# Patient Record
Sex: Female | Born: 1945 | Race: White | Hispanic: No | Smoking: Never smoker
Health system: Southern US, Community
[De-identification: ages and names within clinical notes are randomized; demographics above are authoritative.]

## PROBLEM LIST (undated history)

## (undated) DIAGNOSIS — M069 Rheumatoid arthritis, unspecified: Secondary | ICD-10-CM

## (undated) DIAGNOSIS — I7300 Raynaud's syndrome without gangrene: Secondary | ICD-10-CM

## (undated) DIAGNOSIS — M3501 Sicca syndrome with keratoconjunctivitis: Secondary | ICD-10-CM

## (undated) DIAGNOSIS — M8580 Other specified disorders of bone density and structure, unspecified site: Secondary | ICD-10-CM

## (undated) DIAGNOSIS — E785 Hyperlipidemia, unspecified: Secondary | ICD-10-CM

## (undated) DIAGNOSIS — T7840XA Allergy, unspecified, initial encounter: Secondary | ICD-10-CM

## (undated) DIAGNOSIS — J309 Allergic rhinitis, unspecified: Secondary | ICD-10-CM

## (undated) DIAGNOSIS — M341 CR(E)ST syndrome: Secondary | ICD-10-CM

## (undated) DIAGNOSIS — E559 Vitamin D deficiency, unspecified: Secondary | ICD-10-CM

## (undated) DIAGNOSIS — I82409 Acute embolism and thrombosis of unspecified deep veins of unspecified lower extremity: Secondary | ICD-10-CM

## (undated) DIAGNOSIS — R011 Cardiac murmur, unspecified: Secondary | ICD-10-CM

## (undated) DIAGNOSIS — E7800 Pure hypercholesterolemia, unspecified: Secondary | ICD-10-CM

## (undated) DIAGNOSIS — R0600 Dyspnea, unspecified: Secondary | ICD-10-CM

## (undated) DIAGNOSIS — K219 Gastro-esophageal reflux disease without esophagitis: Secondary | ICD-10-CM

## (undated) DIAGNOSIS — M349 Systemic sclerosis, unspecified: Secondary | ICD-10-CM

## (undated) HISTORY — DX: Pure hypercholesterolemia, unspecified: E78.00

## (undated) HISTORY — DX: Other specified disorders of bone density and structure, unspecified site: M85.80

## (undated) HISTORY — DX: Allergy, unspecified, initial encounter: T78.40XA

## (undated) HISTORY — DX: Cardiac murmur, unspecified: R01.1

## (undated) HISTORY — DX: Systemic sclerosis, unspecified: M34.9

## (undated) HISTORY — DX: Raynaud's syndrome without gangrene: I73.00

## (undated) HISTORY — DX: Sjogren syndrome with keratoconjunctivitis: M35.01

## (undated) HISTORY — DX: Vitamin D deficiency, unspecified: E55.9

## (undated) HISTORY — DX: Gastro-esophageal reflux disease without esophagitis: K21.9

## (undated) HISTORY — DX: Hyperlipidemia, unspecified: E78.5

## (undated) HISTORY — DX: Allergic rhinitis, unspecified: J30.9

## (undated) HISTORY — DX: Rheumatoid arthritis, unspecified: M06.9

## (undated) HISTORY — DX: Acute embolism and thrombosis of unspecified deep veins of unspecified lower extremity: I82.409

## (undated) HISTORY — PX: DIAGNOSTIC LAPAROSCOPY: SUR761

## (undated) HISTORY — DX: Cr(e)st syndrome: M34.1

---

## 1998-04-12 ENCOUNTER — Other Ambulatory Visit: Admission: RE | Admit: 1998-04-12 | Discharge: 1998-04-12 | Payer: Self-pay | Admitting: Obstetrics and Gynecology

## 1999-04-06 ENCOUNTER — Other Ambulatory Visit: Admission: RE | Admit: 1999-04-06 | Discharge: 1999-04-06 | Payer: Self-pay | Admitting: Obstetrics and Gynecology

## 1999-05-14 ENCOUNTER — Encounter: Payer: Self-pay | Admitting: Obstetrics and Gynecology

## 1999-05-14 ENCOUNTER — Encounter: Admission: RE | Admit: 1999-05-14 | Discharge: 1999-05-14 | Payer: Self-pay | Admitting: Obstetrics and Gynecology

## 2000-04-21 ENCOUNTER — Other Ambulatory Visit: Admission: RE | Admit: 2000-04-21 | Discharge: 2000-04-21 | Payer: Self-pay | Admitting: Obstetrics and Gynecology

## 2000-04-21 ENCOUNTER — Encounter: Payer: Self-pay | Admitting: Obstetrics and Gynecology

## 2000-04-21 ENCOUNTER — Encounter: Admission: RE | Admit: 2000-04-21 | Discharge: 2000-04-21 | Payer: Self-pay | Admitting: Obstetrics and Gynecology

## 2000-07-15 ENCOUNTER — Observation Stay (HOSPITAL_COMMUNITY): Admission: EM | Admit: 2000-07-15 | Discharge: 2000-07-16 | Payer: Self-pay

## 2000-07-15 ENCOUNTER — Ambulatory Visit (HOSPITAL_COMMUNITY): Admission: RE | Admit: 2000-07-15 | Discharge: 2000-07-15 | Payer: Self-pay

## 2001-04-27 ENCOUNTER — Encounter: Payer: Self-pay | Admitting: Obstetrics and Gynecology

## 2001-04-27 ENCOUNTER — Encounter: Admission: RE | Admit: 2001-04-27 | Discharge: 2001-04-27 | Payer: Self-pay | Admitting: Obstetrics and Gynecology

## 2001-04-27 ENCOUNTER — Other Ambulatory Visit: Admission: RE | Admit: 2001-04-27 | Discharge: 2001-04-27 | Payer: Self-pay | Admitting: Obstetrics and Gynecology

## 2002-04-30 ENCOUNTER — Encounter: Payer: Self-pay | Admitting: Obstetrics and Gynecology

## 2002-04-30 ENCOUNTER — Encounter: Admission: RE | Admit: 2002-04-30 | Discharge: 2002-04-30 | Payer: Self-pay | Admitting: Obstetrics and Gynecology

## 2002-04-30 ENCOUNTER — Other Ambulatory Visit: Admission: RE | Admit: 2002-04-30 | Discharge: 2002-04-30 | Payer: Self-pay | Admitting: Obstetrics and Gynecology

## 2003-06-06 ENCOUNTER — Other Ambulatory Visit: Admission: RE | Admit: 2003-06-06 | Discharge: 2003-06-06 | Payer: Self-pay | Admitting: Obstetrics and Gynecology

## 2003-06-06 ENCOUNTER — Encounter: Admission: RE | Admit: 2003-06-06 | Discharge: 2003-06-06 | Payer: Self-pay | Admitting: Obstetrics and Gynecology

## 2004-06-11 ENCOUNTER — Other Ambulatory Visit: Admission: RE | Admit: 2004-06-11 | Discharge: 2004-06-11 | Payer: Self-pay | Admitting: Obstetrics and Gynecology

## 2004-06-11 ENCOUNTER — Encounter: Admission: RE | Admit: 2004-06-11 | Discharge: 2004-06-11 | Payer: Self-pay | Admitting: Obstetrics and Gynecology

## 2004-11-26 ENCOUNTER — Encounter: Admission: RE | Admit: 2004-11-26 | Discharge: 2004-11-26 | Payer: Self-pay | Admitting: Rheumatology

## 2005-06-14 ENCOUNTER — Other Ambulatory Visit: Admission: RE | Admit: 2005-06-14 | Discharge: 2005-06-14 | Payer: Self-pay | Admitting: Obstetrics and Gynecology

## 2005-06-14 ENCOUNTER — Encounter: Admission: RE | Admit: 2005-06-14 | Discharge: 2005-06-14 | Payer: Self-pay | Admitting: Obstetrics and Gynecology

## 2005-09-18 ENCOUNTER — Ambulatory Visit: Payer: Self-pay | Admitting: Cardiovascular Disease

## 2005-09-18 ENCOUNTER — Ambulatory Visit (HOSPITAL_COMMUNITY): Admission: RE | Admit: 2005-09-18 | Discharge: 2005-09-18 | Payer: Self-pay | Admitting: Rheumatology

## 2005-09-18 ENCOUNTER — Encounter: Payer: Self-pay | Admitting: Cardiovascular Disease

## 2006-06-09 ENCOUNTER — Encounter: Admission: RE | Admit: 2006-06-09 | Discharge: 2006-06-09 | Payer: Self-pay | Admitting: *Deleted

## 2006-06-16 ENCOUNTER — Encounter: Admission: RE | Admit: 2006-06-16 | Discharge: 2006-06-16 | Payer: Self-pay | Admitting: Obstetrics and Gynecology

## 2006-07-25 ENCOUNTER — Other Ambulatory Visit: Admission: RE | Admit: 2006-07-25 | Discharge: 2006-07-25 | Payer: Self-pay | Admitting: Obstetrics and Gynecology

## 2007-06-19 ENCOUNTER — Encounter: Admission: RE | Admit: 2007-06-19 | Discharge: 2007-06-19 | Payer: Self-pay | Admitting: Obstetrics and Gynecology

## 2007-07-27 ENCOUNTER — Other Ambulatory Visit: Admission: RE | Admit: 2007-07-27 | Discharge: 2007-07-27 | Payer: Self-pay | Admitting: Obstetrics & Gynecology

## 2007-08-10 ENCOUNTER — Encounter: Admission: RE | Admit: 2007-08-10 | Discharge: 2007-08-10 | Payer: Self-pay | Admitting: Obstetrics and Gynecology

## 2007-10-23 ENCOUNTER — Encounter: Admission: RE | Admit: 2007-10-23 | Discharge: 2007-10-23 | Payer: Self-pay | Admitting: Family Medicine

## 2008-06-20 ENCOUNTER — Encounter: Admission: RE | Admit: 2008-06-20 | Discharge: 2008-06-20 | Payer: Self-pay | Admitting: Obstetrics and Gynecology

## 2008-06-27 ENCOUNTER — Encounter: Admission: RE | Admit: 2008-06-27 | Discharge: 2008-06-27 | Payer: Self-pay | Admitting: Obstetrics and Gynecology

## 2008-07-03 ENCOUNTER — Ambulatory Visit: Payer: Self-pay | Admitting: Diagnostic Radiology

## 2008-07-03 ENCOUNTER — Emergency Department (HOSPITAL_BASED_OUTPATIENT_CLINIC_OR_DEPARTMENT_OTHER): Admission: EM | Admit: 2008-07-03 | Discharge: 2008-07-03 | Payer: Self-pay | Admitting: Emergency Medicine

## 2009-06-23 ENCOUNTER — Encounter: Admission: RE | Admit: 2009-06-23 | Discharge: 2009-06-23 | Payer: Self-pay | Admitting: Obstetrics and Gynecology

## 2010-01-08 ENCOUNTER — Ambulatory Visit (HOSPITAL_COMMUNITY): Admission: RE | Admit: 2010-01-08 | Discharge: 2010-01-08 | Payer: Self-pay | Admitting: Rheumatology

## 2010-01-31 ENCOUNTER — Ambulatory Visit (HOSPITAL_COMMUNITY)
Admission: RE | Admit: 2010-01-31 | Discharge: 2010-01-31 | Payer: Self-pay | Source: Home / Self Care | Attending: Rheumatology | Admitting: Rheumatology

## 2010-01-31 ENCOUNTER — Encounter (INDEPENDENT_AMBULATORY_CARE_PROVIDER_SITE_OTHER): Payer: Self-pay | Admitting: Rheumatology

## 2010-06-05 ENCOUNTER — Other Ambulatory Visit: Payer: Self-pay | Admitting: Obstetrics and Gynecology

## 2010-06-05 DIAGNOSIS — Z1231 Encounter for screening mammogram for malignant neoplasm of breast: Secondary | ICD-10-CM

## 2010-06-26 ENCOUNTER — Ambulatory Visit
Admission: RE | Admit: 2010-06-26 | Discharge: 2010-06-26 | Disposition: A | Discharge status: Home / Self Care | Payer: 59 | Source: Ambulatory Visit | Attending: Obstetrics and Gynecology | Admitting: Obstetrics and Gynecology

## 2010-06-26 DIAGNOSIS — Z1231 Encounter for screening mammogram for malignant neoplasm of breast: Secondary | ICD-10-CM

## 2011-05-27 ENCOUNTER — Other Ambulatory Visit: Payer: Self-pay | Admitting: Obstetrics and Gynecology

## 2011-05-27 DIAGNOSIS — Z1231 Encounter for screening mammogram for malignant neoplasm of breast: Secondary | ICD-10-CM

## 2011-06-28 ENCOUNTER — Ambulatory Visit
Admission: RE | Admit: 2011-06-28 | Discharge: 2011-06-28 | Disposition: A | Discharge status: Home / Self Care | Payer: 59 | Source: Ambulatory Visit | Attending: Obstetrics and Gynecology | Admitting: Obstetrics and Gynecology

## 2011-06-28 DIAGNOSIS — Z1231 Encounter for screening mammogram for malignant neoplasm of breast: Secondary | ICD-10-CM

## 2012-06-01 ENCOUNTER — Other Ambulatory Visit: Payer: Self-pay

## 2012-06-01 DIAGNOSIS — Z1231 Encounter for screening mammogram for malignant neoplasm of breast: Secondary | ICD-10-CM

## 2012-06-29 ENCOUNTER — Ambulatory Visit
Admission: RE | Admit: 2012-06-29 | Discharge: 2012-06-29 | Disposition: A | Discharge status: Home / Self Care | Payer: 59 | Source: Ambulatory Visit

## 2012-06-29 DIAGNOSIS — Z1231 Encounter for screening mammogram for malignant neoplasm of breast: Secondary | ICD-10-CM

## 2013-06-08 ENCOUNTER — Other Ambulatory Visit: Payer: Self-pay

## 2013-06-08 DIAGNOSIS — Z1231 Encounter for screening mammogram for malignant neoplasm of breast: Secondary | ICD-10-CM

## 2013-07-05 ENCOUNTER — Encounter (INDEPENDENT_AMBULATORY_CARE_PROVIDER_SITE_OTHER): Payer: Self-pay

## 2013-07-05 ENCOUNTER — Ambulatory Visit
Admission: RE | Admit: 2013-07-05 | Discharge: 2013-07-05 | Disposition: A | Discharge status: Home / Self Care | Payer: Medicare Other | Source: Ambulatory Visit

## 2013-07-05 DIAGNOSIS — Z1231 Encounter for screening mammogram for malignant neoplasm of breast: Secondary | ICD-10-CM

## 2014-06-01 ENCOUNTER — Other Ambulatory Visit: Payer: Self-pay

## 2014-06-01 DIAGNOSIS — Z1231 Encounter for screening mammogram for malignant neoplasm of breast: Secondary | ICD-10-CM

## 2014-07-08 ENCOUNTER — Ambulatory Visit
Admission: RE | Admit: 2014-07-08 | Discharge: 2014-07-08 | Disposition: A | Discharge status: Home / Self Care | Payer: Medicare Other | Source: Ambulatory Visit

## 2014-07-08 DIAGNOSIS — Z1231 Encounter for screening mammogram for malignant neoplasm of breast: Secondary | ICD-10-CM

## 2014-08-22 ENCOUNTER — Other Ambulatory Visit: Payer: Self-pay

## 2015-07-14 ENCOUNTER — Other Ambulatory Visit: Payer: Self-pay

## 2015-07-14 DIAGNOSIS — Z1231 Encounter for screening mammogram for malignant neoplasm of breast: Secondary | ICD-10-CM

## 2015-07-31 ENCOUNTER — Other Ambulatory Visit: Payer: Self-pay | Admitting: Family Medicine

## 2015-07-31 ENCOUNTER — Ambulatory Visit
Admission: RE | Admit: 2015-07-31 | Discharge: 2015-07-31 | Disposition: A | Discharge status: Home / Self Care | Payer: Medicare Other | Source: Ambulatory Visit

## 2015-07-31 DIAGNOSIS — Z1231 Encounter for screening mammogram for malignant neoplasm of breast: Secondary | ICD-10-CM

## 2016-06-18 ENCOUNTER — Other Ambulatory Visit: Payer: Self-pay | Admitting: Family Medicine

## 2016-06-18 DIAGNOSIS — Z1231 Encounter for screening mammogram for malignant neoplasm of breast: Secondary | ICD-10-CM

## 2016-08-06 ENCOUNTER — Ambulatory Visit
Admission: RE | Admit: 2016-08-06 | Discharge: 2016-08-06 | Disposition: A | Discharge status: Home / Self Care | Payer: Medicare Other | Source: Ambulatory Visit | Attending: Family Medicine | Admitting: Family Medicine

## 2016-08-06 DIAGNOSIS — Z1231 Encounter for screening mammogram for malignant neoplasm of breast: Secondary | ICD-10-CM

## 2016-10-30 ENCOUNTER — Telehealth (HOSPITAL_COMMUNITY): Payer: Self-pay | Admitting: *Deleted

## 2016-10-30 DIAGNOSIS — M349 Systemic sclerosis, unspecified: Secondary | ICD-10-CM

## 2016-10-30 NOTE — Telephone Encounter (Signed)
Received referral from Dr Dierdre Forth, pt needs echo, pft and appt w/Dr Bensimhon for scleroderma

## 2016-12-10 ENCOUNTER — Ambulatory Visit (HOSPITAL_COMMUNITY)
Admission: RE | Admit: 2016-12-10 | Discharge: 2016-12-10 | Disposition: A | Discharge status: Home / Self Care | Payer: Medicare Other | Source: Ambulatory Visit | Attending: Internal Medicine | Admitting: Internal Medicine

## 2016-12-10 ENCOUNTER — Ambulatory Visit (HOSPITAL_BASED_OUTPATIENT_CLINIC_OR_DEPARTMENT_OTHER)
Admission: RE | Admit: 2016-12-10 | Discharge: 2016-12-10 | Disposition: A | Discharge status: Home / Self Care | Payer: Medicare Other | Source: Ambulatory Visit | Attending: Internal Medicine | Admitting: Internal Medicine

## 2016-12-10 ENCOUNTER — Encounter (HOSPITAL_COMMUNITY): Payer: Self-pay | Admitting: Internal Medicine

## 2016-12-10 ENCOUNTER — Other Ambulatory Visit (HOSPITAL_COMMUNITY): Payer: Self-pay | Admitting: *Deleted

## 2016-12-10 VITALS — BP 126/66 | HR 71 | Wt 148.2 lb

## 2016-12-10 DIAGNOSIS — Z8249 Family history of ischemic heart disease and other diseases of the circulatory system: Secondary | ICD-10-CM | POA: Diagnosis not present

## 2016-12-10 DIAGNOSIS — Z9189 Other specified personal risk factors, not elsewhere classified: Secondary | ICD-10-CM | POA: Diagnosis not present

## 2016-12-10 DIAGNOSIS — I358 Other nonrheumatic aortic valve disorders: Secondary | ICD-10-CM | POA: Insufficient documentation

## 2016-12-10 DIAGNOSIS — Z823 Family history of stroke: Secondary | ICD-10-CM | POA: Insufficient documentation

## 2016-12-10 DIAGNOSIS — M349 Systemic sclerosis, unspecified: Secondary | ICD-10-CM

## 2016-12-10 DIAGNOSIS — Z833 Family history of diabetes mellitus: Secondary | ICD-10-CM | POA: Diagnosis not present

## 2016-12-10 DIAGNOSIS — Z7189 Other specified counseling: Secondary | ICD-10-CM

## 2016-12-10 DIAGNOSIS — M069 Rheumatoid arthritis, unspecified: Secondary | ICD-10-CM | POA: Insufficient documentation

## 2016-12-10 DIAGNOSIS — Z79899 Other long term (current) drug therapy: Secondary | ICD-10-CM | POA: Diagnosis not present

## 2016-12-10 LAB — PULMONARY FUNCTION TEST
DL/VA % pred: 92 %
DL/VA: 4.43 ml/min/mmHg/L
DLCO UNC % PRED: 75 %
DLCO UNC: 18.41 ml/min/mmHg
FEF 25-75 POST: 3.13 L/s
FEF 25-75 PRE: 2.07 L/s
FEF2575-%CHANGE-POST: 51 %
FEF2575-%PRED-POST: 162 %
FEF2575-%PRED-PRE: 107 %
FEV1-%Change-Post: 9 %
FEV1-%Pred-Post: 94 %
FEV1-%Pred-Pre: 86 %
FEV1-Post: 2.15 L
FEV1-Pre: 1.97 L
FEV1FVC-%CHANGE-POST: -1 %
FEV1FVC-%PRED-PRE: 109 %
FEV6-%CHANGE-POST: 11 %
FEV6-%Pred-Post: 91 %
FEV6-%Pred-Pre: 81 %
FEV6-PRE: 2.35 L
FEV6-Post: 2.62 L
FEV6FVC-%Change-Post: 0 %
FEV6FVC-%Pred-Post: 103 %
FEV6FVC-%Pred-Pre: 104 %
FVC-%CHANGE-POST: 11 %
FVC-%PRED-POST: 87 %
FVC-%Pred-Pre: 78 %
FVC-Post: 2.63 L
FVC-Pre: 2.35 L
POST FEV1/FVC RATIO: 82 %
PRE FEV1/FVC RATIO: 83 %
Post FEV6/FVC ratio: 100 %
Pre FEV6/FVC Ratio: 100 %
RV % pred: 103 %
RV: 2.26 L
TLC % pred: 92 %
TLC: 4.66 L

## 2016-12-10 MED ORDER — ALBUTEROL SULFATE (2.5 MG/3ML) 0.083% IN NEBU
2.5000 mg | INHALATION_SOLUTION | Freq: Once | RESPIRATORY_TRACT | Status: AC
  Administered 2016-12-10: 2.5 mg via RESPIRATORY_TRACT

## 2016-12-10 NOTE — Progress Notes (Signed)
  Echocardiogram 2D Echocardiogram has been performed.  Dantae Meunier T Zeph Riebel 12/10/2016, 10:24 AM

## 2016-12-10 NOTE — Patient Instructions (Signed)
Calcium Score CT scan, this cost $150 out of pocket  We will contact you in 1 year to schedule your next appointment.

## 2016-12-10 NOTE — Progress Notes (Addendum)
PULMONARY HTN CLINIC CONSULT NOTE  Referring Physician: Dr. Dierdre Forth  Primary Care: Dr. 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 Dierdre Forth for enrollment into the Pulmonary HTN screening program.  Denies any h/o known cardiac problems. Has never had a stress trest. Extensive Fhx CAD. Exercises with yoga and walking. No CP. Dyspne on hills. No edema, orthopnea, PND.   ECHO (today - reviewed with her personally): 55-60% Grade I DD. RV normal. Mildly calcified Aov Trivial TR    PFTs 12/10/16 FEV1 1.97 (86%) FVC  2.35 (78%) DLCO 75%    Review of Systems: [y] = yes, [ ]  = no   General: Weight gain [ ] ; Weight loss [ ] ; Anorexia [ ] ; Fatigue [ ] ; Fever [ ] ; Chills [ ] ; Weakness [ ]   Cardiac: Chest pain/pressure [ ] ; Resting SOB [ ] ; Exertional SOB [ ] ; Orthopnea [ ] ; Pedal Edema [ ] ; Palpitations [ ] ; Syncope [ ] ; Presyncope [ ] ; Paroxysmal nocturnal dyspnea[ ]   Pulmonary: Cough [ ] ; Wheezing[ ] ; Hemoptysis[ ] ; Sputum [ ] ; Snoring [ ]   GI: Vomiting[ ] ; Dysphagia[ ] ; Melena[ ] ; Hematochezia [ ] ; Heartburn[ y ]; Abdominal pain [ ] ; Constipation [ ] ; Diarrhea [ ] ; BRBPR [ ]   GU: Hematuria[ ] ; Dysuria [ ] ; Nocturia[ ]   Vascular: Pain in legs with walking [ ] ; Pain in feet with lying flat [ ] ; Non-healing sores [ ] ; Stroke [ ] ; TIA [ ] ; Slurred speech [ ] ;  Neuro: Headaches[ ] ; Vertigo[ ] ; Seizures[ ] ; Paresthesias[ ] ;Blurred vision [ ] ; Diplopia [ ] ; Vision changes [ ]   Ortho/Skin: Arthritis ]; Joint pain [ y]; Muscle pain [ ] ; Joint swelling ]; Back Pain [ ] ; Rash [ ]   Psych: Depression[ ] ; Anxiety[ ]   Heme: Bleeding problems [ ] ; Clotting disorders [ ] ; Anemia [ ]   Endocrine: Diabetes [ ] ; Thyroid dysfunction[ ]    No past medical history on file.  Current Outpatient Prescriptions  Medication Sig Dispense Refill  . amLODipine (NORVASC) 5 MG tablet Take 5 mg by mouth daily.    . Biotin  (BIOTIN 5000) 5 MG CAPS Take 1 capsule by mouth daily.    . Brompheniramine-Pseudoeph (BROMALINE PO) Take by mouth.    . Cholecalciferol (VITAMIN D) 2000 units CAPS Take 1 capsule by mouth daily.    SURECLICK 50 MG/ML injection Inject 1 mL into the skin once a week.    . famotidine (PEPCID) 40 MG tablet Take 40 mg by mouth 3 (three) times a week.    . Flaxseed, Linseed, (FLAX SEED OIL) 1300 MG CAPS Take 1 capsule by mouth daily.    . folic acid (FOLVITE) 1 MG tablet Take 1 mg by mouth daily.    . hydroxychloroquine (PLAQUENIL) 200 MG tablet Take 2 tablets by mouth daily.    . Magnesium 500 MG CAPS Take 500 mg by mouth daily.    . methotrexate (RHEUMATREX) 2.5 MG tablet Take 8 tablets by mouth once a week.    . Multiple Vitamin (MULTIVITAMIN) tablet Take 1 tablet by mouth daily.    . Omega-3 Fatty Acids (FISH OIL) 1000 MG CAPS Take 1 capsule by mouth daily.    omeprazole (PRILOSEC) 40 MG capsule Take 1 capsule by mouth 4 (four) times a week.    . pilocarpine (SALAGEN) 5 MG tablet Take 1 tablet by mouth 2 (two) times daily.     RESTASIS MULTIDOSE 0.05 % ophthalmic emulsion Place 1 drop into both eyes 2 (two) times daily.    . Saccharomyces boulardii (PROBIOTIC) 250 MG CAPS Take 1 capsule by mouth 2 (two) times daily.    . Turmeric 450 MG CAPS Take 1 tablet by mouth daily.     No current facility-administered medications for this encounter.     No Known Allergies    Social History   Social History  . Marital status: Divorced    Spouse name: N/A  . Number of children: N/A  . Years of education: N/A   Occupational History  . Not on file.   Social History Main Topics  . Smoking status: Never Smoker  . Smokeless tobacco: Never Used  . Alcohol use Not on file  . Drug use: Unknown  . Sexual activity: Not on file   Other Topics Concern  . Not on file   Social History Narrative  . No narrative on file      Family History  Problem Relation Age of Onset  . Breast  cancer Neg Hx    Father died at 78 y/o from MI Mom had DM and CAD and HF. Died from CVA at 10  Vitals:   12/10/16 1037  BP: 126/66  Pulse: 71  SpO2: 99%  Weight: 148 lb 4 oz (67.2 kg)    PHYSICAL EXAM: General:  Well appearing. No respiratory difficulty HEENT: normal Neck: supple. no JVD. Carotids 2+ bilat; no bruits. No lymphadenopathy or thryomegaly appreciated. Cor: PMI nondisplaced. Regular rate & rhythm. 2/6 SEM RSB. S2 preserved Lungs: clear Abdomen: soft, nontender, nondistended. No hepatosplenomegaly. No bruits or masses. Good bowel sounds. Extremities: no cyanosis, clubbing, rash, edema. Diffuse arthritic changes. Mild sclerodactyly Neuro: alert & oriented x 3, cranial nerves grossly intact. moves all 4 extremities w/o difficulty. Affect pleasant.   ASSESSMENT & PLAN: 1. Scleroderma - Echo and PFTs reviewed with her personally. No evidence of PAH or pulmonary fibrosis - Repeat screening in 1 year   2. Cardiac risk stratification - With her FHx and presence of CTD she is at high risk for CAD. Have recommended cardiac calcium scoring.   3. Mild aortic valve calcification  - Mild AS murmur on exam. But gradient very mild. Repeat echo 1 year.   Arvilla Meres, MD  8:25 PM

## 2016-12-10 NOTE — Addendum Note (Signed)
Encounter addended by: Dolores Patty, MD on: 12/10/2016  8:51 PM<BR>    Actions taken: Sign clinical note

## 2016-12-16 ENCOUNTER — Ambulatory Visit (INDEPENDENT_AMBULATORY_CARE_PROVIDER_SITE_OTHER)
Admission: RE | Admit: 2016-12-16 | Discharge: 2016-12-16 | Disposition: A | Discharge status: Home / Self Care | Payer: Self-pay | Source: Ambulatory Visit | Attending: Internal Medicine | Admitting: Internal Medicine

## 2016-12-16 DIAGNOSIS — Z9189 Other specified personal risk factors, not elsewhere classified: Secondary | ICD-10-CM

## 2017-07-07 ENCOUNTER — Other Ambulatory Visit: Payer: Self-pay | Admitting: Family Medicine

## 2017-07-07 DIAGNOSIS — Z1231 Encounter for screening mammogram for malignant neoplasm of breast: Secondary | ICD-10-CM

## 2017-08-11 ENCOUNTER — Ambulatory Visit
Admission: RE | Admit: 2017-08-11 | Discharge: 2017-08-11 | Disposition: A | Discharge status: Home / Self Care | Payer: Medicare Other | Source: Ambulatory Visit | Attending: Family Medicine | Admitting: Family Medicine

## 2017-08-11 DIAGNOSIS — Z1231 Encounter for screening mammogram for malignant neoplasm of breast: Secondary | ICD-10-CM

## 2018-06-30 ENCOUNTER — Other Ambulatory Visit: Payer: Self-pay | Admitting: Family Medicine

## 2018-06-30 DIAGNOSIS — Z1231 Encounter for screening mammogram for malignant neoplasm of breast: Secondary | ICD-10-CM

## 2018-08-24 ENCOUNTER — Ambulatory Visit
Admission: RE | Admit: 2018-08-24 | Discharge: 2018-08-24 | Disposition: A | Discharge status: Home / Self Care | Payer: Medicare Other | Source: Ambulatory Visit | Attending: Family Medicine | Admitting: Family Medicine

## 2018-08-24 ENCOUNTER — Other Ambulatory Visit: Payer: Self-pay

## 2018-08-24 DIAGNOSIS — Z1231 Encounter for screening mammogram for malignant neoplasm of breast: Secondary | ICD-10-CM

## 2018-10-16 ENCOUNTER — Other Ambulatory Visit: Payer: Self-pay | Admitting: Family Medicine

## 2018-10-16 DIAGNOSIS — M8580 Other specified disorders of bone density and structure, unspecified site: Secondary | ICD-10-CM

## 2019-01-01 ENCOUNTER — Other Ambulatory Visit: Payer: Self-pay

## 2019-01-01 ENCOUNTER — Ambulatory Visit
Admission: RE | Admit: 2019-01-01 | Discharge: 2019-01-01 | Disposition: A | Discharge status: Home / Self Care | Payer: Medicare Other | Source: Ambulatory Visit | Attending: Family Medicine | Admitting: Family Medicine

## 2019-01-01 DIAGNOSIS — M8580 Other specified disorders of bone density and structure, unspecified site: Secondary | ICD-10-CM

## 2019-04-19 ENCOUNTER — Ambulatory Visit: Payer: Medicare Other

## 2019-09-22 ENCOUNTER — Other Ambulatory Visit: Payer: Self-pay | Admitting: Family Medicine

## 2019-09-22 DIAGNOSIS — Z1231 Encounter for screening mammogram for malignant neoplasm of breast: Secondary | ICD-10-CM

## 2019-10-08 ENCOUNTER — Ambulatory Visit: Payer: Medicare Other

## 2019-10-12 ENCOUNTER — Other Ambulatory Visit: Payer: Self-pay

## 2019-10-12 ENCOUNTER — Ambulatory Visit
Admission: RE | Admit: 2019-10-12 | Discharge: 2019-10-12 | Disposition: A | Discharge status: Home / Self Care | Payer: Medicare Other | Source: Ambulatory Visit | Attending: Family Medicine | Admitting: Family Medicine

## 2019-10-12 DIAGNOSIS — Z1231 Encounter for screening mammogram for malignant neoplasm of breast: Secondary | ICD-10-CM

## 2019-10-26 ENCOUNTER — Other Ambulatory Visit (HOSPITAL_COMMUNITY): Payer: Self-pay | Admitting: Family Medicine

## 2019-10-26 DIAGNOSIS — M349 Systemic sclerosis, unspecified: Secondary | ICD-10-CM

## 2019-10-26 DIAGNOSIS — I7300 Raynaud's syndrome without gangrene: Secondary | ICD-10-CM

## 2019-11-30 ENCOUNTER — Other Ambulatory Visit: Payer: Self-pay

## 2019-11-30 ENCOUNTER — Ambulatory Visit (HOSPITAL_COMMUNITY): Payer: Medicare Other | Attending: Cardiology

## 2019-11-30 DIAGNOSIS — M349 Systemic sclerosis, unspecified: Secondary | ICD-10-CM

## 2019-11-30 DIAGNOSIS — I7300 Raynaud's syndrome without gangrene: Secondary | ICD-10-CM | POA: Diagnosis not present

## 2019-11-30 LAB — ECHOCARDIOGRAM COMPLETE
AR max vel: 1.57 cm2
AV Area VTI: 1.39 cm2
AV Area mean vel: 1.49 cm2
AV Mean grad: 15 mmHg
AV Peak grad: 23.7 mmHg
Ao pk vel: 2.44 m/s
Area-P 1/2: 2.77 cm2
S' Lateral: 2.9 cm

## 2019-12-23 NOTE — Progress Notes (Signed)
Cardiology Office Note:    Date:  12/24/2019   ID:  Regina, Tate 06/05/1945, MRN [id number]  PCP:  Maurice Small, MD  [facility] Cardiologist:  No primary care provider on file.  [facility] Electrophysiologist:  None   Referring MD: Maurice Small, MD    History of Present Illness:    Regina Tate is a 74 y.o. female with a hx of RA, scleroderma, and moderate aortic stenosis who was referred by Dr. Valentina Lucks for further management of her aortic stenosis.  Last saw Dr. [name] in 2018 for possible enrollment into the pulmonary HTN screening program. At that time, she denied any history of cardiac problems. She was active with walking and yoga with no exertional symptoms. Has a very strong family history of CAD. TTE and PFTs at that time without evidence of PAH or pulmonary fibrosis but noted mild aortic stenosis with peak gradient . She was recommended for yearly follow-up.   Since her last visit to Cardiology clinic, she underwent TTE on 11/30/19 which showed interval progression in AS with severe valvular thickening and calcification, moderate AS with mean gradient and AVA 1.4cm2. LVEF remains normal at 60-65%.  Today, the patient states that she feels overall well. She does water aerobics 2x/week and silver sneakers 2x/week and does not have any chest pressure, significant DOE, or palpitations. No orthopnea, PND, LE edema, lightheadedness, dizziness or syncope. Takes amlodipine for raynaud's but no significant HTN. PFTs has been followed by Rheumatologist and they have been normal per report.  Coronary calcium score 119  PFTs 12/10/16 FEV1 1.97 (86%) FVC  2.35 (78%) DLCO 75%   TC 195, HDL 84, LDL 101 (05/07/19)  Family history: Sister had CVA 1. Father died MI 22, Aunt 59 CVA, Grandfather died 54 with MI, Mother died at 16 with CHF.   Past Medical History:  Diagnosis Date  . Allergic rhinitis   . Allergies   . CREST syndrome  (HCC)   . Deep vein thrombosis (DVT) (HCC)   . GERD (gastroesophageal reflux disease)   . Heart murmur   . Heart murmur   . Hypercholesteremia   . Hyperlipidemia   . Osteopenia   . RA (rheumatoid arthritis) (HCC)   . Raynaud's syndrome without gangrene   . Scleroderma (HCC)   . Sjogren's syndrome with keratoconjunctivitis sicca (HCC)   . Vitamin D deficiency     No past surgical history on file.  Current Medications: Current Meds  Medication Sig  . amLODipine (NORVASC) 5 MG tablet Take 5 mg by mouth daily.  . Biotin (BIOTIN 5000) 5 MG CAPS Take 1 capsule by mouth daily.  . Bromelains 500 MG TABS Take by mouth daily.  . Cholecalciferol (VITAMIN D) 2000 units CAPS Take 1 capsule by mouth daily.  Elgie Collard SURECLICK 50 MG/ML injection Inject 1 mL into the skin once a week.  . famotidine (PEPCID) 40 MG tablet Take 40 mg by mouth 3 (three) times a week.  . Flaxseed, Linseed, (FLAX SEED OIL) 1300 MG CAPS Take 1 capsule by mouth daily.  . folic acid (FOLVITE) 1 MG tablet Take 1 mg by mouth daily.  . hydroxychloroquine (PLAQUENIL) 200 MG tablet Take 2 tablets by mouth daily.  . methotrexate (RHEUMATREX) 2.5 MG tablet Take 8 tablets by mouth once a week.  . Multiple Vitamin (MULTIVITAMIN) tablet Take 1 tablet by mouth daily.  . Omega-3 Fatty Acids (FISH OIL) 1000 MG CAPS Take 1 capsule by mouth daily.  Marland Kitchen omeprazole (  PRILOSEC) 40 MG capsule Take 1 capsule by mouth 4 (four) times a week.  . pilocarpine (SALAGEN) 5 MG tablet Take 1 tablet by mouth 2 (two) times daily.  . RESTASIS MULTIDOSE 0.05 % ophthalmic emulsion Place 1 drop into both eyes 2 (two) times daily.  . Turmeric 450 MG CAPS Take 1 tablet by mouth daily.     Allergies:   Patient has no known allergies.   Social History   Socioeconomic History  . Marital status: Divorced    Spouse name: Not on file  . Number of children: Not on file  . Years of education: Not on file  . Highest education level: Not on file  Occupational  History  . Not on file  Tobacco Use  . Smoking status: Never Smoker  . Smokeless tobacco: Never Used  Substance and Sexual Activity  . Alcohol use: Not on file  . Drug use: Not on file  . Sexual activity: Not on file  Other Topics Concern  . Not on file  Social History Narrative  . Not on file   Social Determinants of Health   Financial Resource Strain:   . Difficulty of Paying Living Expenses: Not on file  Food Insecurity:   . Worried About Programme researcher, broadcasting/film/video in the Last Year: Not on file  . Ran Out of Food in the Last Year: Not on file  Transportation Needs:   . Lack of Transportation (Medical): Not on file  . Lack of Transportation (Non-Medical): Not on file  Physical Activity:   . Days of Exercise per Week: Not on file  . Minutes of Exercise per Session: Not on file  Stress:   . Feeling of Stress : Not on file  Social Connections:   . Frequency of Communication with Friends and Family: Not on file  . Frequency of Social Gatherings with Friends and Family: Not on file  . Attends Religious Services: Not on file  . Active Member of Clubs or Organizations: Not on file  . Attends Banker Meetings: Not on file  . Marital Status: Not on file     Family History: Sister had CVA 65. Father died MI 40, Aunt 6 CVA, Grandfather died 73 with MI, Mother died at 50 with CHF.   ROS:   Please see the history of present illness.    Review of Systems  Constitutional: Negative for chills and fever.  HENT: Negative for hearing loss.   Eyes: Negative for blurred vision.  Respiratory: Negative for cough.   Cardiovascular: Negative for chest pain, palpitations, orthopnea, claudication, leg swelling and PND.  Gastrointestinal: Negative for nausea and vomiting.  Genitourinary: Negative for dysuria.  Musculoskeletal: Positive for myalgias.  Neurological: Negative for dizziness and loss of consciousness.  Psychiatric/Behavioral: Negative for depression.     EKGs/Labs/Other Studies Reviewed:    The following studies were reviewed today: 11/30/19: IMPRESSIONS    1. Left ventricular ejection fraction, by estimation, is 60 to 65%. The  left ventricle has normal function. The left ventricle has no regional  wall motion abnormalities. Left ventricular diastolic parameters are  consistent with Grade I diastolic  dysfunction (impaired relaxation). Elevated left atrial pressure. The  average left ventricular global longitudinal strain is 22.6 %. The global  longitudinal strain is normal.  2. Right ventricular systolic function is normal. The right ventricular  size is normal.  3. Left atrial size was mildly dilated.  4. The mitral valve is normal in structure. Trivial mitral valve  regurgitation. No evidence of mitral stenosis.  5. Tricuspid valve regurgitation is mild to moderate.  6. The aortic valve is normal in structure. There is severe calcifcation  of the aortic valve. There is severe thickening of the aortic valve.  Aortic valve regurgitation is mild. Moderate aortic valve stenosis. Aortic  valve mean gradient measures 15.0  mmHg.  7. The inferior vena cava is normal in size with greater than 50%  respiratory variability, suggesting right atrial pressure of 3 mmHg.   TTE 12/10/16: Study Conclusions   - Left ventricle: The cavity size was normal. Wall thickness was  normal. Systolic function was normal. The estimated ejection  fraction was in the range of 55% to 60%. Wall motion was normal;  there were no regional wall motion abnormalities. Doppler  parameters are consistent with abnormal left ventricular  relaxation (grade 1 diastolic dysfunction).  - Aortic valve: There was trivial regurgitation. Valve area (Vmax):  1.37 cm^2.  - Mitral valve: Trivial prolapse, involving the posterior leaflet.   -------------------------------------------------------------------  Study data: No prior study was available  for comparison. Study  status: Routine. Procedure: Transthoracic echocardiography.  Image quality was adequate. Study completion: There were no  complications.     Transthoracic echocardiography. M-mode,  complete 2D, spectral Doppler, and color Doppler. Birthdate:  Patient birthdate: 1945/03/21. Age: Patient is 74 yr old. Sex:  Gender: female.  BMI: 24.6 kg/m^2. Blood pressure:   105/69  Patient status: Outpatient. Study date: Study date: 12/10/2016.  Study time: 09:36 AM. Location: Echo laboratory.   -------------------------------------------------------------------   -------------------------------------------------------------------  Left ventricle: The cavity size was normal. Wall thickness was  normal. Systolic function was normal. The estimated ejection  fraction was in the range of 55% to 60%. Wall motion was normal;  there were no regional wall motion abnormalities. Doppler  parameters are consistent with abnormal left ventricular relaxation  (grade 1 diastolic dysfunction).   -------------------------------------------------------------------  Aortic valve:  Trileaflet; mildly calcified leaflets. Cusp  separation was normal. Doppler: Transvalvular velocity was within  the normal range. There was no stenosis. There was trivial  regurgitation.  Peak velocity ratio of LVOT to aortic valve:  0.54. Valve area (Vmax): 1.37 cm^2. Indexed valve area (Vmax): 0.79  cm^2/m^2.  Peak gradient (S): 15 mm Hg.   -------------------------------------------------------------------  Aorta: Aortic root: The aortic root was normal in size.  Ascending aorta: The ascending aorta was normal in size.   -------------------------------------------------------------------  Mitral valve:  Trivial prolapse, involving the posterior leaflet.  Doppler: There was trivial regurgitation.   -------------------------------------------------------------------  Left  atrium: The atrium was normal in size.   -------------------------------------------------------------------  Right ventricle: The cavity size was normal. Wall thickness was  normal. Systolic function was normal.   -------------------------------------------------------------------  Pulmonic valve:  The valve appears to be grossly normal.  Doppler: There was trivial regurgitation.   -------------------------------------------------------------------  Tricuspid valve:  Structurally normal valve.  Leaflet separation  was normal. Doppler: Transvalvular velocity was within the normal  range. There was trivial regurgitation.   -------------------------------------------------------------------  Right atrium: The atrium was normal in size.   -------------------------------------------------------------------  Pericardium: The pericardium was normal in appearance.   -------------------------------------------------------------------  Systemic veins:  Inferior vena cava: The vessel was normal in size. The  respirophasic diameter changes were in the normal range (>= 50%),  consistent with normal central venous pressure.  Cardiac CT 2018: FINDINGS: Non-cardiac: See separate report from [facility].  Ascending Aorta:  Normal size.  Calcifications of the aortic valve.  Pericardium: Normal.  Coronary arteries:  Normal origin.  IMPRESSION: 1. Coronary calcium score of 119. This was 61 percentile for age and sex matched control.  2. Calcifications of the aortic valve.   Carotid doppler 05/2006: IMPRESSION:  No significant carotid stenosis.   EKG:  EKG is  ordered today.  The ekg ordered today demonstrates NSR with LAD, q waves inferiorly and poor r-wave progression  Recent Labs: No results found for requested labs within last 8760 hours.  Recent Lipid Panel No results found for: CHOL, TRIG, HDL, CHOLHDL, VLDL, LDLCALC, LDLDIRECT   Physical Exam:     VS:  BP (!) 112/58   Pulse 80   Ht 5\' 4"  (1.626 m)   Wt 146 lb (66.2 kg)   SpO2 96%   BMI 25.06 kg/m     Wt Readings from Last 3 Encounters:  12/24/19 146 lb (66.2 kg)  12/10/16 148 lb 4 oz (67.2 kg)     GEN:  Well nourished, well developed in no acute distress HEENT: Normal NECK: No JVD; No carotid bruits CARDIAC: RRR, 3/6 crescendo-descrescendo systolic murmur that peaks mid-systole with preserved A2 RESPIRATORY:  Clear to auscultation without rales, wheezing or rhonchi  ABDOMEN: Soft, non-tender, non-distended MUSCULOSKELETAL:  No edema; No deformity  SKIN: Warm and dry NEUROLOGIC:  Alert and oriented x 3 PSYCHIATRIC:  Normal affect   ASSESSMENT:    1. Moderate aortic stenosis   2. Aortic valve insufficiency, etiology of cardiac valve disease unspecified   3. Hyperlipidemia, unspecified hyperlipidemia type   4. Coronary artery calcification of native artery   5. Scleroderma (HCC)    PLAN:    In order of problems listed above:  #Moderate AS: #Mild Aortic Regurgitation: Mean gradient with AVA 1.4cm2. Patient is asymptomatic but notable progression since last TTE in 2018 where peak gradient was . Valve is heavily calcified and thickened. Suspect she will need a AoV replacement in the future. -Monitor with yearly TTEs -Discussed that she will likely progress and need AoV in the future -Discussed symptoms (exertional SOB, decreased exercise tolerance, chest pain, lightheadedness) and to contact us if these were to occur  #Coronary calcification: #Strong family history CAD: Patient with coronary calcium score 119 in 2018 consistent with 76% for age, gender matched controls. Asymptomatic but given risk factors and family history, will start statin therapy. -Start crestor 10mg  daily, goal LDL<70 -Repeat cholesterol panel with LFTs in 6 weeks  #Scleroderma #RA: #Sjogren's #Raynauds Normal RV size and function on TTE. PFTs reportedly normal. Followed  closely by rheum. -Follow-up with rheum as scheduled -Continue immunosuppressants -Yearly TTEs as above   Medication Adjustments/Labs and Tests Ordered: Current medicines are reviewed at length with the patient today.  Concerns regarding medicines are outlined above.  Orders Placed This Encounter  Procedures  . Lipid panel  . Hepatic function panel  . EKG 12-Lead   Meds ordered this encounter  Medications  . rosuvastatin (CRESTOR) 10 MG tablet    Sig: Take 1 tablet (10 mg total) by mouth daily.    Dispense:  90 tablet    Refill:  3    Patient Instructions  Medication Instructions:   START TAKING  (ROSUVASTATIN) CRESTOR 10 MG ONCE A DAY     *If you need a refill on your cardiac medications before your next appointment, please call your pharmacy*   Lab Work: return 6 weeks for fasting labs lipids and liver  If you have labs (blood work) drawn today and your tests are completely normal, you will  receive your results only by: Marland Kitchen MyChart Message (if you have MyChart) OR . A paper copy in the mail If you have any lab test that is abnormal or we need to change your treatment, we will call you to review the results.   Testing/Procedures: NONE ORDERED  TODAY   Follow-Up: At [facility], you and your health needs are our priority.  As part of our continuing mission to provide you with exceptional heart care, we have created designated Provider Care Teams.  These Care Teams include your primary Cardiologist (physician) and Advanced Practice Providers (APPs -  Physician Assistants and Nurse Practitioners) who all work together to provide you with the care you need, when you need it.  We recommend signing up for the patient portal called "MyChart".  Sign up information is provided on this After Visit Summary.  MyChart is used to connect with patients for Virtual Visits (Telemedicine).  Patients are able to view lab/test results, encounter notes, upcoming appointments, etc.   Non-urgent messages can be sent to your provider as well.   To learn more about what you can do with MyChart, go to [email or web address].    Your next appointment:   1 year(s)  The format for your next appointment:   In Person  Provider:   Laurance Flatten, MD   Other Instructions      Signed, Meriam Sprague, MD  12/24/2019 9:29 AM    [facility]

## 2019-12-24 ENCOUNTER — Encounter: Payer: Self-pay | Admitting: Cardiology

## 2019-12-24 ENCOUNTER — Ambulatory Visit (INDEPENDENT_AMBULATORY_CARE_PROVIDER_SITE_OTHER): Payer: Medicare Other | Admitting: Cardiology

## 2019-12-24 ENCOUNTER — Other Ambulatory Visit: Payer: Self-pay

## 2019-12-24 VITALS — BP 112/58 | HR 80 | Ht 64.0 in | Wt 146.0 lb

## 2019-12-24 DIAGNOSIS — E785 Hyperlipidemia, unspecified: Secondary | ICD-10-CM | POA: Diagnosis not present

## 2019-12-24 DIAGNOSIS — I351 Nonrheumatic aortic (valve) insufficiency: Secondary | ICD-10-CM | POA: Diagnosis not present

## 2019-12-24 DIAGNOSIS — M349 Systemic sclerosis, unspecified: Secondary | ICD-10-CM

## 2019-12-24 DIAGNOSIS — I2584 Coronary atherosclerosis due to calcified coronary lesion: Secondary | ICD-10-CM

## 2019-12-24 DIAGNOSIS — I350 Nonrheumatic aortic (valve) stenosis: Secondary | ICD-10-CM

## 2019-12-24 DIAGNOSIS — I2510 Atherosclerotic heart disease of native coronary artery without angina pectoris: Secondary | ICD-10-CM | POA: Diagnosis not present

## 2019-12-24 MED ORDER — ROSUVASTATIN CALCIUM 10 MG PO TABS: 10.0000 mg | ORAL_TABLET | Freq: Every day | ORAL | 3 refills | Status: DC

## 2019-12-24 NOTE — Patient Instructions (Signed)
Medication Instructions:   START TAKING  (ROSUVASTATIN) CRESTOR 10 MG ONCE A DAY     *If you need a refill on your cardiac medications before your next appointment, please call your pharmacy*   Lab Work: return 6 weeks for fasting labs lipids and liver  If you have labs (blood work) drawn today and your tests are completely normal, you will receive your results only by: Marland Kitchen MyChart Message (if you have MyChart) OR . A paper copy in the mail If you have any lab test that is abnormal or we need to change your treatment, we will call you to review the results.   Testing/Procedures: NONE ORDERED  TODAY   Follow-Up: At [facility], you and your health needs are our priority.  As part of our continuing mission to provide you with exceptional heart care, we have created designated Provider Care Teams.  These Care Teams include your primary Cardiologist (physician) and Advanced Practice Providers (APPs -  Physician Assistants and Nurse Practitioners) who all work together to provide you with the care you need, when you need it.  We recommend signing up for the patient portal called "MyChart".  Sign up information is provided on this After Visit Summary.  MyChart is used to connect with patients for Virtual Visits (Telemedicine).  Patients are able to view lab/test results, encounter notes, upcoming appointments, etc.  Non-urgent messages can be sent to your provider as well.   To learn more about what you can do with MyChart, go to [email or web address].    Your next appointment:   1 year(s)  The format for your next appointment:   In Person  Provider:   Laurance Flatten, MD   Other Instructions

## 2020-01-31 ENCOUNTER — Other Ambulatory Visit: Payer: Self-pay

## 2020-01-31 MED ORDER — ROSUVASTATIN CALCIUM 10 MG PO TABS: 10.0000 mg | ORAL_TABLET | Freq: Every day | ORAL | 3 refills | Status: DC

## 2020-02-09 ENCOUNTER — Other Ambulatory Visit: Payer: Medicare Other | Admitting: *Deleted

## 2020-02-09 ENCOUNTER — Telehealth: Payer: Self-pay | Admitting: Cardiology

## 2020-02-09 ENCOUNTER — Other Ambulatory Visit: Payer: Self-pay

## 2020-02-09 DIAGNOSIS — I351 Nonrheumatic aortic (valve) insufficiency: Secondary | ICD-10-CM

## 2020-02-09 LAB — LIPID PANEL
Chol/HDL Ratio: 1.8 ratio (ref 0.0–4.4)
Cholesterol, Total: 161 mg/dL (ref 100–199)
HDL: 90 mg/dL (ref >39)
LDL Chol Calc (NIH): 58 mg/dL (ref 0–99)
Triglycerides: 68 mg/dL (ref 0–149)
VLDL Cholesterol Cal: 13 mg/dL (ref 5–40)

## 2020-02-09 LAB — HEPATIC FUNCTION PANEL
ALT: 28 IU/L (ref 0–32)
AST: 25 IU/L (ref 0–40)
Albumin: 4.2 g/dL (ref 3.7–4.7)
Alkaline Phosphatase: 56 IU/L (ref 44–121)
Bilirubin Total: 0.3 mg/dL (ref 0.0–1.2)
Bilirubin, Direct: 0.11 mg/dL (ref 0.00–0.40)
Total Protein: 5.9 g/dL — ABNORMAL LOW (ref 6.0–8.5)

## 2020-02-09 MED ORDER — ATORVASTATIN CALCIUM 20 MG PO TABS: 20.0000 mg | ORAL_TABLET | Freq: Every day | ORAL | 3 refills | Status: DC

## 2020-02-09 NOTE — Telephone Encounter (Signed)
Meriam Sprague, MD 15 minutes ago (10:10 AM)     We can definitely try a different agent. Let's change her to atorvastatin 20mg  daily. She can wait to start it for 7days so she has some relief from her symptoms with Crestor. Let know if this doesn't work as we have multiple different agents we can try.     Patient aware of the above recommendations. New Rx sent.

## 2020-02-09 NOTE — Telephone Encounter (Signed)
We can definitely try a different agent. Let's change her to atorvastatin 20mg  daily. She can wait to start it for 7days so she has some relief from her symptoms with Crestor. Let know if this doesn't work as we have multiple different agents we can try.

## 2020-02-09 NOTE — Telephone Encounter (Signed)
Patient is calling stating she believes that her Crestor 10 mg has worsened her already occurring symptoms. She states she has been taking this medication since the end of October. 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 However, she states that she thinks the Crestor has exacerbated her pain.  She states the aching in her lower back and shoulders has worsened especially in her left shoulder. She also states she is having nausea and her issues with swallowing has worsened as well. She states she has noticed this change over the past few weeks. She denies any lightheadedness, chest pain, or diaphoresis. She would like to stop taking Crestor or find an alternative. Will route to Dr. Shari Prows for advisement.

## 2020-02-09 NOTE — Telephone Encounter (Signed)
Pt c/o medication issue:  1. Name of Medication: rosuvastatin (CRESTOR) 10 MG tablet  2. How are you currently taking this medication (dosage and times per day)? 1 tablet by mouth daily   3. Are you having a reaction (difficulty breathing--STAT)? Yes   4. What is your medication issue? Regina Tate is calling stating she believes this medication has worsened her already occurring symptoms. She states the aching in her lower back and shoulders has worsened especially in her left shoulder. She also states she is having nausea and her issues with swallowing has worsened as well. She states she has noticed this change over the past few weeks. Please advise.

## 2020-02-09 NOTE — Addendum Note (Signed)
Addended by: Roslynn Amble R on: 02/09/2020 10:41 AM   Modules accepted: Orders

## 2020-10-25 ENCOUNTER — Other Ambulatory Visit: Payer: Self-pay | Admitting: Family Medicine

## 2020-10-25 DIAGNOSIS — M8588 Other specified disorders of bone density and structure, other site: Secondary | ICD-10-CM

## 2020-12-05 ENCOUNTER — Other Ambulatory Visit: Payer: Self-pay

## 2020-12-05 ENCOUNTER — Ambulatory Visit
Admission: RE | Admit: 2020-12-05 | Discharge: 2020-12-05 | Disposition: A | Discharge status: Home / Self Care | Payer: Medicare Other | Source: Ambulatory Visit | Attending: Family Medicine | Admitting: Family Medicine

## 2020-12-05 DIAGNOSIS — M8588 Other specified disorders of bone density and structure, other site: Secondary | ICD-10-CM

## 2020-12-22 NOTE — Progress Notes (Deleted)
Cardiology Office Note    Date:  12/22/2020   ID:  Regina, Tate 1945/10/03, MRN [id number]  PCP:  Regina Small, MD  Cardiologist:  Regina Sprague, MD  Electrophysiologist:  None   Chief Complaint: ***  History of Present Illness:   Regina Tate is a 75 y.o. female with history of RA, scleroderma, CREST syndrome, aortic stenosis, coronary calcification, remote DVT, GERD, HLD who is seen back for routine follow-up.   She previously saw Dr. Gala Tate in 2018 for possible enrollment into the pulmonary HTN screening program. At that time, she denied any history of cardiac problems. She was active with walking and yoga with no exertional symptoms. Has a very strong family history of CAD. TTE and PFTs at that time without evidence of PAH or pulmonary fibrosis but noted mild aortic stenosis. She did have calcium score of 119 (76%ile). Yearly follow-up was recommended. Last 2D Echo 11/2019 EF 60-65%, grade 1 DD, mild LAE, mild-moderate TR, severe thickening of AV with mild AI, moderate aortic stenosis. She's had myalgias with rosuvastatin so was switched to atorvastatin previously.  Hx DVT?  Aortic stenosis Aortic insufficiency Hyperlipidemia Coronary artery calcification  Labwork independently reviewed: 01/2020 AST/ALT OK, HDL 90, LDL 58, trig 68   Past Medical History:  Diagnosis Date   Allergic rhinitis    Allergies    CREST syndrome (HCC)    Deep vein thrombosis (DVT) (HCC)    GERD (gastroesophageal reflux disease)    Heart murmur    Heart murmur    Hypercholesteremia    Hyperlipidemia    Osteopenia    RA (rheumatoid arthritis) (HCC)    Raynaud's syndrome without gangrene    Scleroderma (HCC)    Sjogren's syndrome with keratoconjunctivitis sicca (HCC)    Vitamin D deficiency     No past surgical history on file.  Current Medications: No outpatient medications have been marked as taking for the 12/26/20 encounter (Appointment) with  Regina Montana, PA-C.   ***   Allergies:   Patient has no known allergies.   Social History   Socioeconomic History   Marital status: Divorced    Spouse name: Not on file   Number of children: Not on file   Years of education: Not on file   Highest education level: Not on file  Occupational History   Not on file  Tobacco Use   Smoking status: Never   Smokeless tobacco: Never  Substance and Sexual Activity   Alcohol use: Not on file   Drug use: Not on file   Sexual activity: Not on file  Other Topics Concern   Not on file  Social History Narrative   Not on file   Social Determinants of Health   Financial Resource Strain: Not on file  Food Insecurity: Not on file  Transportation Needs: Not on file  Physical Activity: Not on file  Stress: Not on file  Social Connections: Not on file     Family History:  The patient's ***family history is negative for Breast cancer.  ROS:   Please see the history of present illness. Otherwise, review of systems is positive for ***.  All other systems are reviewed and otherwise negative.    EKGs/Labs/Other Studies Reviewed:    Studies reviewed are outlined and summarized above. Reports included below if pertinent.  2D echo 11/2019  1. Left ventricular ejection fraction, by estimation, is 60 to 65%. The  left ventricle has normal function. The left ventricle has no  regional  wall motion abnormalities. Left ventricular diastolic parameters are  consistent with Grade I diastolic  dysfunction (impaired relaxation). Elevated left atrial pressure. The  average left ventricular global longitudinal strain is 22.6 %. The global  longitudinal strain is normal.   2. Right ventricular systolic function is normal. The right ventricular  size is normal.   3. Left atrial size was mildly dilated.   4. The mitral valve is normal in structure. Trivial mitral valve  regurgitation. No evidence of mitral stenosis.   5. Tricuspid valve  regurgitation is mild to moderate.   6. The aortic valve is normal in structure. There is severe calcifcation  of the aortic valve. There is severe thickening of the aortic valve.  Aortic valve regurgitation is mild. Moderate aortic valve stenosis. Aortic  valve mean gradient measures 15.0  mmHg.   7. The inferior vena cava is normal in size with greater than 50%  respiratory variability, suggesting right atrial pressure of 3 mmHg.   Calcium score 2018 EXAM: Coronary Calcium Score   TECHNIQUE: The patient was scanned on a [employer]. Axial non-contrast 3 mm slices were carried out through the heart. The data set was analyzed on a dedicated work station and scored using the Agatson method.   FINDINGS: Non-cardiac: See separate report from [facility].   Ascending Aorta:  Normal size.   Calcifications of the aortic valve.   Pericardium: Normal.   Coronary arteries:  Normal origin.   IMPRESSION: 1. Coronary calcium score of 119. This was 17 percentile for age and sex matched control.   2. Calcifications of the aortic valve.   Tobias Alexander     Electronically Signed   By: Tobias Alexander   On: 12/16/2016 18:12  IMPRESSION: No acute or significant extracardiac abnormality.   Electronically Signed: By: Charlett Nose M.D. On: 12/16/2016 16:17  Carotid US 2008 IMPRESSION:  No significant carotid stenosis.      EKG:  EKG is ordered today, personally reviewed, demonstrating ***  Recent Labs: 02/09/2020: ALT 28  Recent Lipid Panel    Component Value Date/Time   CHOL 161 02/09/2020 0739   TRIG 68 02/09/2020 0739   HDL 90 02/09/2020 0739   CHOLHDL 1.8 02/09/2020 0739   LDLCALC 58 02/09/2020 0739    PHYSICAL EXAM:    VS:  There were no vitals taken for this visit.  BMI: There is no height or weight on file to calculate BMI.  GEN: Well nourished, well developed female in no acute distress HEENT: normocephalic, atraumatic Neck: no JVD,  carotid bruits, or masses Cardiac: ***RRR; no murmurs, rubs, or gallops, no edema  Respiratory:  clear to auscultation bilaterally, normal work of breathing GI: soft, nontender, nondistended, + BS MS: no deformity or atrophy Skin: warm and dry, no rash Neuro:  Alert and Oriented x 3, Strength and sensation are intact, follows commands Psych: euthymic mood, full affect  Wt Readings from Last 3 Encounters:  12/24/19 146 lb (66.2 kg)  12/10/16 148 lb 4 oz (67.2 kg)     ASSESSMENT & PLAN:   ***     Disposition: F/u with ***   Medication Adjustments/Labs and Tests Ordered: Current medicines are reviewed at length with the patient today.  Concerns regarding medicines are outlined above. Medication changes, Labs and Tests ordered today are summarized above and listed in the Patient Instructions accessible in Encounters.   Signed, Regina Montana, PA-C  12/22/2020 5:10 PM    [facility]  [address], [place], [place]  [address] Phone: [phone]; Fax: [phone]

## 2020-12-25 ENCOUNTER — Other Ambulatory Visit: Payer: Self-pay | Admitting: *Deleted

## 2020-12-25 MED ORDER — ATORVASTATIN CALCIUM 20 MG PO TABS: 20.0000 mg | ORAL_TABLET | Freq: Every day | ORAL | 3 refills | Status: DC

## 2020-12-26 ENCOUNTER — Telehealth: Payer: Self-pay | Admitting: *Deleted

## 2020-12-26 ENCOUNTER — Ambulatory Visit: Payer: Medicare Other | Admitting: Physician Assistant

## 2020-12-26 DIAGNOSIS — I350 Nonrheumatic aortic (valve) stenosis: Secondary | ICD-10-CM

## 2020-12-26 DIAGNOSIS — E785 Hyperlipidemia, unspecified: Secondary | ICD-10-CM

## 2020-12-26 DIAGNOSIS — I351 Nonrheumatic aortic (valve) insufficiency: Secondary | ICD-10-CM

## 2020-12-26 DIAGNOSIS — I2510 Atherosclerotic heart disease of native coronary artery without angina pectoris: Secondary | ICD-10-CM

## 2020-12-26 NOTE — Telephone Encounter (Signed)
Left a detailed message for pt that her appointment today, 12/26/2020, with Ronie Spies, PA-C, has to be cancelled as provider had to cancel clinic.  Left message for pt to call back and reschedule with 1st available APP.

## 2021-01-21 NOTE — Progress Notes (Addendum)
Cardiology Office Note:    Date:  01/21/2021   ID:  Clary, Dobson 03-18-1945, MRN [id number]  PCP:  Kelton Pillar, MD   [facility] Providers Cardiologist:  Freada Bergeron, MD      Referring MD: Kelton Pillar, MD   Follow-up for aortic stenosis and hyperlipidemia.  History of Present Illness:    Regina Tate is a 75 y.o. female with a hx of rheumatoid arthritis, scleredema, hyperlipidemia, aortic stenosis, and family history of coronary artery disease.  Sister had CVA at age 12, father died of MI at 29, and CVA at age 48, grandfather died 44 of MI, mother died at 70 with CHF.  She was seen by Dr. Haroldine Laws in 2018 for pulmonary hypertension screening.  At that time she denied cardiac problems.  She was active walking and doing yoga with no exertional type symptoms.  She reported a strong family history of coronary artery disease.  Her echocardiogram and PFTs showed no evidence of pulmonary artery hypertension or fibrosis but did note mild aortic stenosis with a peak gradient of 15 mmHg.  Her LVEF was 60-65% annual follow-up was recommended.  She was seen by Dr. Johney Frame 12/24/2019.  During that time she continued to do well.  She was doing water aerobics 2 times per week and Silver sneakers 2 times per week.  She denied chest discomfort.  She denied significant DOE and/or palpitations.  She denied orthopnea, lower extremity swelling, lightheadedness, dizziness.  She was taking amlodipine for her Raynaud's syndrome and was not noted to have significant hypertension.  Her PFTs have been followed by rheumatology and were normal by her report.  Her coronary calcium score was 119.  She presents the clinic today for follow-up evaluation states she feels well.  She continues to be very physically active.  She does Silver sneakers 2 days/week, water aerobics 2 days/week, and yoga 2 days/week.  She reports that she feels possibly slightly short of breath  with these activities.  She did report a episode of double vision over the summer.  She contacted her PCP who recommended that she take baby aspirin for several days.  Her PCP felt she may have had a TIA at that time.  She is neurologically intact and has no deficits.  She has not had any further events with we double vision.  Reviewed her previous echocardiogram and she expressed understanding.  We also reviewed the process of typical aortic valve stenosis progression.  I will order a follow-up echocardiogram, have her continue her physical activity, give the salty 6 diet sheet, current plan follow-up for 12 months.  Today she denies chest pain, shortness of breath, lower extremity edema, fatigue, palpitations, melena, hematuria, hemoptysis,  orthopnea, and PND.   Past Medical History:  Diagnosis Date   Allergic rhinitis    Allergies    CREST syndrome (HCC)    Deep vein thrombosis (DVT) (HCC)    GERD (gastroesophageal reflux disease)    Heart murmur    Heart murmur    Hypercholesteremia    Hyperlipidemia    Osteopenia    RA (rheumatoid arthritis) (HCC)    Raynaud's syndrome without gangrene    Scleroderma (HCC)    Sjogren's syndrome with keratoconjunctivitis sicca (HCC)    Vitamin D deficiency     No past surgical history on file.  Current Medications: No outpatient medications have been marked as taking for the 01/22/21 encounter (Appointment) with Deberah Pelton, NP.     Allergies:  Patient has no known allergies.   Social History   Socioeconomic History   Marital status: Divorced    Spouse name: Not on file   Number of children: Not on file   Years of education: Not on file   Highest education level: Not on file  Occupational History   Not on file  Tobacco Use   Smoking status: Never   Smokeless tobacco: Never  Substance and Sexual Activity   Alcohol use: Not on file   Drug use: Not on file   Sexual activity: Not on file  Other Topics Concern   Not on file   Social History Narrative   Not on file   Social Determinants of Health   Financial Resource Strain: Not on file  Food Insecurity: Not on file  Transportation Needs: Not on file  Physical Activity: Not on file  Stress: Not on file  Social Connections: Not on file     Family History: The patient's family history is negative for Breast cancer.  ROS:   Please see the history of present illness.     All other systems reviewed and are negative.   Risk Assessment/Calculations:           Physical Exam:    VS:  There were no vitals taken for this visit.    Wt Readings from Last 3 Encounters:  12/24/19 146 lb (66.2 kg)  12/10/16 148 lb 4 oz (67.2 kg)     GEN:  Well nourished, well developed in no acute distress HEENT: Normal NECK: No JVD; No carotid bruits LYMPHATICS: No lymphadenopathy CARDIAC: RRR, 3/6 systolic murmur heard best along right sternal border, rubs, gallops RESPIRATORY:  Clear to auscultation without rales, wheezing or rhonchi  ABDOMEN: Soft, non-tender, non-distended MUSCULOSKELETAL:  No edema; No deformity  SKIN: Warm and dry NEUROLOGIC:  Alert and oriented x 3 PSYCHIATRIC:  Normal affect    EKGs/Labs/Other Studies Reviewed:    The following studies were reviewed today:  Echocardiogram 11/30/2019  IMPRESSIONS     1. Left ventricular ejection fraction, by estimation, is 60 to 65%. The  left ventricle has normal function. The left ventricle has no regional  wall motion abnormalities. Left ventricular diastolic parameters are  consistent with Grade I diastolic  dysfunction (impaired relaxation). Elevated left atrial pressure. The  average left ventricular global longitudinal strain is 22.6 %. The global  longitudinal strain is normal.   2. Right ventricular systolic function is normal. The right ventricular  size is normal.   3. Left atrial size was mildly dilated.   4. The mitral valve is normal in structure. Trivial mitral valve   regurgitation. No evidence of mitral stenosis.   5. Tricuspid valve regurgitation is mild to moderate.   6. The aortic valve is normal in structure. There is severe calcifcation  of the aortic valve. There is severe thickening of the aortic valve.  Aortic valve regurgitation is mild. Moderate aortic valve stenosis. Aortic  valve mean gradient measures 15.0  mmHg.   7. The inferior vena cava is normal in size with greater than 50%  respiratory variability, suggesting right atrial pressure of 3 mmHg.  EKG:  EKG is  ordered today.  The ekg ordered today demonstrates normal sinus rhythm inferior infarct undetermined age anterior infarct undetermined age 79 bpm.  Recent Labs: 02/09/2020: ALT 28  Recent Lipid Panel    Component Value Date/Time   CHOL 161 02/09/2020 0739   TRIG 68 02/09/2020 0739   HDL 90 02/09/2020 0739  CHOLHDL 1.8 02/09/2020 0739   LDLCALC 58 02/09/2020 0739    ASSESSMENT & PLAN    Aortic stenosis, mild aortic valve regurgitation-no increased DOE or activity intolerance.  Continues to be very physically active doing Silver sneakers, continues walking, and water aerobics.  Echocardiogram 11/30/2019 showed LVEF 60-65%, G1 DD, and moderate aortic valve stenosis details above. Continue physical activity Heart healthy low-sodium diet-salty 6 given Repeat echocardiogram  Coronary calcification, family history of coronary artery disease-no recent episodes of arm neck back or chest discomfort.  Previous coronary calcium score 119 in 2018.  LDL 69 on 10/23/20 Continue rosuvastatin Heart healthy low-sodium high-fiber diet. Increase physical activity as tolerated Repeat fasting lipids and LFTs  Rheumatoid arthritis, Sjogren's, Raynaud's-stable.  Reports compliance with immunosuppressants. Follows with rheumatology  Disposition: Follow-up with Dr. Johney Frame in 12 months.       Medication Adjustments/Labs and Tests Ordered: Current medicines are reviewed at length with  the patient today.  Concerns regarding medicines are outlined above.  No orders of the defined types were placed in this encounter.  No orders of the defined types were placed in this encounter.   There are no Patient Instructions on file for this visit.   Signed, Deberah Pelton, NP  01/21/2021 12:35 PM      Notice: This dictation was prepared with Dragon dictation along with smaller phrase technology. Any transcriptional errors that result from this process are unintentional and may not be corrected upon review.  I spent 13 minutes examining this patient, reviewing medications, and using patient centered shared decision making involving her cardiac care.  Prior to her visit I spent greater than 20 minutes reviewing her past medical history,  medications, and prior cardiac tests.

## 2021-01-22 ENCOUNTER — Ambulatory Visit (INDEPENDENT_AMBULATORY_CARE_PROVIDER_SITE_OTHER): Payer: Medicare Other | Admitting: General Practice

## 2021-01-22 ENCOUNTER — Encounter (HOSPITAL_BASED_OUTPATIENT_CLINIC_OR_DEPARTMENT_OTHER): Payer: Self-pay | Admitting: General Practice

## 2021-01-22 ENCOUNTER — Other Ambulatory Visit: Payer: Self-pay

## 2021-01-22 VITALS — BP 128/76 | HR 89 | Ht 64.0 in | Wt 145.1 lb

## 2021-01-22 DIAGNOSIS — I2584 Coronary atherosclerosis due to calcified coronary lesion: Secondary | ICD-10-CM

## 2021-01-22 DIAGNOSIS — I350 Nonrheumatic aortic (valve) stenosis: Secondary | ICD-10-CM | POA: Diagnosis not present

## 2021-01-22 DIAGNOSIS — M3500 Sicca syndrome, unspecified: Secondary | ICD-10-CM

## 2021-01-22 DIAGNOSIS — I2510 Atherosclerotic heart disease of native coronary artery without angina pectoris: Secondary | ICD-10-CM

## 2021-01-22 DIAGNOSIS — I351 Nonrheumatic aortic (valve) insufficiency: Secondary | ICD-10-CM | POA: Diagnosis not present

## 2021-01-22 NOTE — Patient Instructions (Addendum)
Medication Instructions:  Your Physician recommend you continue on your current medication as directed.    *If you need a refill on your cardiac medications before your next appointment, please call your pharmacy*   Lab Work: None ordered today   Testing/Procedures: Your physician has requested that you have an echocardiogram. Echocardiography is a painless test that uses sound waves to create images of your heart. It provides your doctor with information about the size and shape of your heart and how well your heart's chambers and valves are working. This procedure takes approximately one hour. There are no restrictions for this procedure. [address]   Follow-Up: At [facility], you and your health needs are our priority.  As part of our continuing mission to provide you with exceptional heart care, we have created designated Provider Care Teams.  These Care Teams include your primary Cardiologist (physician) and Advanced Practice Providers (APPs -  Physician Assistants and Nurse Practitioners) who all work together to provide you with the care you need, when you need it.  We recommend signing up for the patient portal called "MyChart".  Sign up information is provided on this After Visit Summary.  MyChart is used to connect with patients for Virtual Visits (Telemedicine).  Patients are able to view lab/test results, encounter notes, upcoming appointments, etc.  Non-urgent messages can be sent to your provider as well.   To learn more about what you can do with MyChart, go to [email or web address].    Your next appointment:   1 year(s)  The format for your next appointment:   In Person  Provider:   Meriam Sprague, MD     Other Instructions American Heart Association Recommendations for Physical Activity in Adults  Are you fitting in at least 150 minutes (2.5 hours) of heart-pumping physical activity per week? If not, you're not alone. Only about one  in five adults and teens get enough exercise to maintain good health. Being more active can help all people think, feel and sleep better and perform daily tasks more easily. And if you're sedentary, sitting less is a great place to start.  These recommendations are based on the Physical Activity Guidelines for Americans, 2nd edition, published by the U.S. Department of Health and Health and safety inspector, Office of Disease Prevention and Health Promotion. They recommend how much physical activity we need to be healthy. The guidelines are based on current scientific evidence supporting the connections between physical activity, overall health and well-being, disease prevention and quality of life.  AHA Physical Activity Recommendations for Adults     Get at least 150 minutes per week of moderate-intensity aerobic activity or 75 minutes per week of vigorous aerobic activity, or a combination of both, preferably spread throughout the week.  Add moderate- to high-intensity muscle-strengthening activity (such as resistance or weights) on at least 2 days per week. Spend less time sitting. Even light-intensity activity can offset some of the risks of being sedentary.  Gain even more benefits by being active at least 300 minutes (5 hours) per week. Increase amount and intensity gradually over time.   What is intensity?  Physical activity is anything that moves your body and burns calories. This includes things like walking, climbing stairs and stretching.  Aerobic (or "cardio") activity gets your heart rate up and benefits your heart by improving cardiorespiratory fitness. When done at moderate intensity, your heart will beat faster and you'll breathe harder than normal, but you'll still be able to talk. Think  of it as a medium or moderate amount of effort.  Examples of moderate-intensity aerobic activities:  brisk walking (at least 2.5 miles per hour) water aerobics dancing (ballroom or  social) gardening tennis (doubles) biking slower than 10 miles per hour Vigorous intensity activities will push your body a little further. They will require a higher amount of effort. You'll probably get warm and begin to sweat. You won't be able to talk much without getting out of breath.  Examples of vigorous-intensity aerobic activities:  hiking uphill or with a heavy backpack running swimming laps aerobic dancing heavy yardwork like continuous digging or hoeing tennis (singles) cycling 10 miles per hour or faster jumping rope Knowing your target heart rate can also help you track the intensity of your activities.  For maximum benefits, include both moderate- and vigorous-intensity activity in your routine along with strengthening and stretching exercises.  What if I'm just starting to get active?  Don't worry if you can't reach 150 minutes per week just yet. Everyone has to start somewhere. Even if you've been sedentary for years, today is the day you can begin to make healthy changes in your life. Set a reachable goal for today. You can work up toward the recommended amount by increasing your time as you get stronger. Don't let all-or-nothing thinking keep you from doing what you can every day.  The simplest way to get moving and improve your health is to start walking. It's free, easy and can be done just about anywhere, even in place.  Any amount of movement is better than none. And you can break it up into short bouts of activity throughout the day. Taking a brisk walk for five or ten minutes a few times a day will add up.  If you have a chronic condition or disability, talk with your healthcare provider about what types and amounts of physical activity are right for you before making too many changes. But don't wait! Get started today by simply sitting less and moving more, whatever that looks like for you.  The takeaway:  Move more, with more intensity, and sit less. Science  has linked being inactive and sitting too much with higher risk of heart disease, type 2 diabetes, colon and lung cancers, and early death.  It's clear that being more active benefits everyone and helps [place] live longer, healthier lives.  Here are some of the big wins:  Lower risk of heart disease, stroke, type 2 diabetes, high blood pressure, dementia and Alzheimer's, several types of cancer, and some complications of pregnancy\  Better sleep, including improvements in insomnia and obstructive sleep apnea  Improved cognition, including memory, attention and processing speed  Less weight gain, obesity and related chronic health conditions  Better bone health and balance, with less risk of injury from falls  Fewer symptoms of depression and anxiety  Better quality of life and sense of overall well-being

## 2021-02-02 ENCOUNTER — Ambulatory Visit (INDEPENDENT_AMBULATORY_CARE_PROVIDER_SITE_OTHER): Payer: Medicare Other

## 2021-02-02 ENCOUNTER — Other Ambulatory Visit: Payer: Self-pay

## 2021-02-02 DIAGNOSIS — I351 Nonrheumatic aortic (valve) insufficiency: Secondary | ICD-10-CM

## 2021-02-02 LAB — ECHOCARDIOGRAM COMPLETE
AR max vel: 0.94 cm2
AV Area VTI: 0.84 cm2
AV Area mean vel: 0.83 cm2
AV Mean grad: 25.3 mmHg
AV Peak grad: 42.8 mmHg
AV Vena cont: 0.35 cm
Ao pk vel: 3.27 m/s
Area-P 1/2: 3.23 cm2
Calc EF: 57.6 %
P 1/2 time: 663 msec
S' Lateral: 2.84 cm
Single Plane A2C EF: 55.7 %
Single Plane A4C EF: 58.3 %

## 2021-03-14 ENCOUNTER — Telehealth: Payer: Self-pay | Admitting: Cardiology

## 2021-03-14 DIAGNOSIS — I351 Nonrheumatic aortic (valve) insufficiency: Secondary | ICD-10-CM

## 2021-03-14 DIAGNOSIS — I350 Nonrheumatic aortic (valve) stenosis: Secondary | ICD-10-CM

## 2021-03-14 NOTE — Telephone Encounter (Signed)
Patient states she is very fatigue and dont have any energy to do anything. She stated to call our office once she started feeling like that.  Please advise

## 2021-03-14 NOTE — Telephone Encounter (Signed)
Pt reports that lately (since beginning of year) it has been pretty difficult to exercise like she is used to. She has to keep stopping, even if doing regular house work, to rest for a minute and is having lots of fatigue and some SOB.  Denies chest pain, palpitations, weight gain Also reports that lately appetite has also been reduced. States she was told to let us know if these symptoms occurred. Aware will forward to Dr. Devin Going nurse to discuss with MD and follow up with pt. Aware RN will be in touch.  Pt is agreeable to plan.

## 2021-03-15 NOTE — Telephone Encounter (Signed)
Echo tech reached out to Dr. Shari Prows and myself about this pt and getting her echo done tomorrow 1/20.  Pt recently had an echo done on 02/02/21, as ordered by Edd Fabian NP.  Echo tech informed us of this and wanted to clarify with Dr. Shari Prows if repeat echo is needed tomorrow. December echo did reveal that pts aortic valve was severely calcified with moderate aortic valve stenosis.  She has had mild dilation of ascending aorta.  Aortic valve has progressed compared to previous one back on 11/30/19.    Per Dr. Shari Prows, being the pt had a recent echo done in Dec, we can cancel her echo scheduled for tomorrow 1/20, but the pt is to keep her follow-up appt as scheduled with Dr. Shari Prows on 1/23 at 1020.  Left the pt a message to call the office back to discuss cancelled echo tomorrow and to keep her follow-up appt as planned for 1/23 with Dr. Shari Prows.

## 2021-03-15 NOTE — Telephone Encounter (Signed)
Can we get her in for echo ASAP and clinic visit with me? I think her AS may have progressed.

## 2021-03-15 NOTE — Telephone Encounter (Signed)
Patient is returning call.  °

## 2021-03-15 NOTE — Telephone Encounter (Signed)
Attempted to call the pt back to endorse cancelled echo for tomorrow and she did not answer.  2nd attempt at trying to make contact with her about this.

## 2021-03-15 NOTE — Telephone Encounter (Signed)
Spoke with the pt and endorsed to her recommendations per Dr. Shari ProwsPemberton.  Pt aware that Dr. Shari ProwsPemberton is concerned her aortic stenosis has progressed based on complaints voiced to triage nurse yesterday, and she wants her to have an urgent echo done and urgent appt with her in clinic. Scheduled the pt to come in and see Dr. Shari ProwsPemberton on next Monday 1/23 at 1020.  Pt is aware to arrive 15 mins prior to this appt.  Informed the pt that I will place the urgent echo order in the system and send a message to our Echo/PCC scheduler to call her back today and schedule her urgent echo.  Advised the pt to be on the listen out for our echo scheduler to call her back to arrange this appt.  Pt verbalized understanding and agrees with this plan.    [facility], she was able to get the pts echo scheduled for tomorrow 03[facility]16/21 at 2 [facility]  Pt aware to arrive 15 mins prior to this appt.  Pt states she is familiar with our [facility] location and knows where to go, for she has seen an APP there before.  Reiterated to the pt that her echo is scheduled for tomorrow 1/20 at 2 pm, be there at 1:45 pm and her clinic appt with Dr. Shari ProwsPemberton is on next Monday 1/23 at 1020, [facility].  Pt verbalized understanding and agrees with this plan. Will send this message to Dr. Shari ProwsPemberton as an Lorain ChildesFYI, to make her aware of this plan.

## 2021-03-15 NOTE — Telephone Encounter (Signed)
2nd attempt at trying to make contact with the pt, with no success.

## 2021-03-15 NOTE — Telephone Encounter (Signed)
Left the pt a message to call the office back as soon as she can, to discuss recommendations per Dr. Shari Prows.

## 2021-03-16 ENCOUNTER — Other Ambulatory Visit (HOSPITAL_BASED_OUTPATIENT_CLINIC_OR_DEPARTMENT_OTHER): Payer: Medicare Other

## 2021-03-16 NOTE — Telephone Encounter (Signed)
Transferred call to Ivy  

## 2021-03-16 NOTE — Telephone Encounter (Signed)
Attempted to call the pt back again to endorse cancelled echo today, but she did not answer.  Made echo tech at our [facility] location that our office has made several attempts as well.

## 2021-03-16 NOTE — Telephone Encounter (Signed)
Was able to make contact with the pt and informed her that per Dr. Shari Prows, know need for her to have a repeat echo that was scheduled for today, for she had one done in Dec.  Pt aware that echo was cancelled.  Pt aware to keep her follow-up appt with Dr. Shari Prows as scheduled for next Monday 1/23 at 1020. Pt verbalized understanding and agrees with this plan.

## 2021-03-17 NOTE — Progress Notes (Deleted)
Cardiology Office Note:    Date:  03/17/2021   ID:  Regina Tate, Regina Tate 11-05-1945, MRN [id number]  PCP:  Maurice Small, MD  [facility] Cardiologist:  Meriam Sprague, MD  [facility] Electrophysiologist:  None   Referring MD: Maurice Small, MD    History of Present Illness:    Regina Tate is a 76 y.o. female with a histiry of RA, scleroderma, and moderate aortic stenosis who was initially referred for moderate AS who now returns to clinic for follow-up.  Patient initially saw Dr. [name] in 2018 for possible enrollment into the pulmonary HTN screening program. At that time, she denied any history of cardiac problems. She was active with walking and yoga with no exertional symptoms. Has a very strong family history of CAD. TTE and PFTs at that time without evidence of PAH or pulmonary fibrosis but noted mild aortic stenosis with peak gradient . She was recommended for yearly follow-up.   She underwent TTE on 11/30/19 which showed interval progression in AS with severe valvular thickening and calcification, moderate AS with mean gradient and AVA 1.4cm2. LVEF remained normal at 60-65%. Ca score 119.  When she saw me in clinic on 11/2019, she was doing well without exertional or HF symptoms. She saw Edd Fabian on 01/22/21 where she continued to feel well. TTE was ordered for serial monitoring of AS.   TTE on 02/02/21 showed EF 60-65%, G1DD, trivial MR, mild AR, moderate AS with AVA 0.83cm, mean gradient , Vmax 3.3, DI 0.33. She called clinic on 03/14/21 with symptoms of decreased exercise capacity, fatigue and SOB. She now presents to clinic for further evaluation.  Today, ***  Family history: Sister had CVA 26. Father died MI 45, Aunt 69 CVA, Grandfather died 74 with MI, Mother died at 69 with CHF.   Past Medical History:  Diagnosis Date   Allergic rhinitis    Allergies    CREST syndrome (HCC)    Deep vein thrombosis (DVT)  (HCC)    GERD (gastroesophageal reflux disease)    Heart murmur    Heart murmur    Hypercholesteremia    Hyperlipidemia    Osteopenia    RA (rheumatoid arthritis) (HCC)    Raynaud's syndrome without gangrene    Scleroderma (HCC)    Sjogren's syndrome with keratoconjunctivitis sicca (HCC)    Vitamin D deficiency     No past surgical history on file.  Current Medications: No outpatient medications have been marked as taking for the 03/19/21 encounter (Appointment) with Meriam Sprague, MD.     Allergies:   Patient has no known allergies.   Social History   Socioeconomic History   Marital status: Divorced    Spouse name: Not on file   Number of children: Not on file   Years of education: Not on file   Highest education level: Not on file  Occupational History   Not on file  Tobacco Use   Smoking status: Never   Smokeless tobacco: Never  Substance and Sexual Activity   Alcohol use: Not on file   Drug use: Not on file   Sexual activity: Not on file  Other Topics Concern   Not on file  Social History Narrative   Not on file   Social Determinants of Health   Financial Resource Strain: Not on file  Food Insecurity: Not on file  Transportation Needs: Not on file  Physical Activity: Not on file  Stress: Not on file  Social Connections:  Not on file     Family History: Sister had CVA 1140. Father died MI 8158, Aunt 3460 CVA, Grandfather died 4058 with MI, Mother died at 9380 with CHF.   ROS:   Please see the history of present illness.    Review of Systems  Constitutional:  Negative for chills and fever.  HENT:  Negative for hearing loss.   Eyes:  Negative for blurred vision.  Respiratory:  Negative for cough.   Cardiovascular:  Negative for chest pain, palpitations, orthopnea, claudication, leg swelling and PND.  Gastrointestinal:  Negative for nausea and vomiting.  Genitourinary:  Negative for dysuria.  Musculoskeletal:  Positive for myalgias.  Neurological:   Negative for dizziness and loss of consciousness.  Psychiatric/Behavioral:  Negative for depression.    EKGs/Labs/Other Studies Reviewed:    The following studies were reviewed today: TTE 02/02/21: IMPRESSIONS     1. Left ventricular ejection fraction, by estimation, is 60 to 65%. Left  ventricular ejection fraction by 3D volume is 60 %. The left ventricle has  normal function. The left ventricle has no regional wall motion  abnormalities. There is mild concentric  left ventricular hypertrophy. Left ventricular diastolic parameters are  consistent with Grade I diastolic dysfunction (impaired relaxation).   2. Right ventricular systolic function is normal. The right ventricular  size is normal.   3. The mitral valve is normal in structure. Trivial mitral valve  regurgitation. No evidence of mitral stenosis.   4. The aortic valve is calcified. There is severe calcifcation of the  aortic valve. There is severe thickening of the aortic valve. Aortic valve  regurgitation is mild. Moderate aortic valve stenosis. AVA 0.83cm2 by  continuity, mean gradient 26mmHg, Vmax   3.3 m/s, DI 0.33   5. Aortic dilatation noted. There is mild dilatation of the ascending  aorta, measuring 36 mm.   6. The inferior vena cava is normal in size with greater than 50%  respiratory variability, suggesting right atrial pressure of 3 mmHg.   7. Left atrial size was mildly dilated.   Comparison(s): Prior TTE in 11/2019 showed EF 60%, GLS -22.6%, AS mean  gradient 15 mmHg, peak gradient 23.137mmHg, mild AI. Compared to prior, the  mean AoV gradient has increased.   11/30/19: IMPRESSIONS   1. Left ventricular ejection fraction, by estimation, is 60 to 65%. The  left ventricle has normal function. The left ventricle has no regional  wall motion abnormalities. Left ventricular diastolic parameters are  consistent with Grade I diastolic  dysfunction (impaired relaxation). Elevated left atrial pressure. The   average left ventricular global longitudinal strain is 22.6 %. The global  longitudinal strain is normal.   2. Right ventricular systolic function is normal. The right ventricular  size is normal.   3. Left atrial size was mildly dilated.   4. The mitral valve is normal in structure. Trivial mitral valve  regurgitation. No evidence of mitral stenosis.   5. Tricuspid valve regurgitation is mild to moderate.   6. The aortic valve is normal in structure. There is severe calcifcation  of the aortic valve. There is severe thickening of the aortic valve.  Aortic valve regurgitation is mild. Moderate aortic valve stenosis. Aortic  valve mean gradient measures 15.0  mmHg.   7. The inferior vena cava is normal in size with greater than 50%  respiratory variability, suggesting right atrial pressure of 3 mmHg.   TTE 12/10/16: Study Conclusions   - Left ventricle: The cavity size was normal. Wall thickness  was    normal. Systolic function was normal. The estimated ejection    fraction was in the range of 55% to 60%. Wall motion was normal;    there were no regional wall motion abnormalities. Doppler    parameters are consistent with abnormal left ventricular    relaxation (grade 1 diastolic dysfunction).  - Aortic valve: There was trivial regurgitation. Valve area (Vmax):    1.37 cm^2.  - Mitral valve: Trivial prolapse, involving the posterior leaflet.   -------------------------------------------------------------------  Study data:  No prior study was available for comparison.  Study  status:  Routine.  Procedure:  Transthoracic echocardiography.  Image quality was adequate.  Study completion:  There were no  complications.          Transthoracic echocardiography.  M-mode,  complete 2D, spectral Doppler, and color Doppler.  Birthdate:  Patient birthdate: 11-08-1945.  Age:  Patient is 76 yr old.  Sex:  Gender: female.    BMI: 24.6 kg/m^2.  Blood pressure:     105/69  Patient status:   Outpatient.  Study date:  Study date: 12/10/2016.  Study time: 09:36 AM.  Location:  Echo laboratory.   -------------------------------------------------------------------   -------------------------------------------------------------------  Left ventricle:  The cavity size was normal. Wall thickness was  normal. Systolic function was normal. The estimated ejection  fraction was in the range of 55% to 60%. Wall motion was normal;  there were no regional wall motion abnormalities. Doppler  parameters are consistent with abnormal left ventricular relaxation  (grade 1 diastolic dysfunction).   -------------------------------------------------------------------  Aortic valve:   Trileaflet; mildly calcified leaflets. Cusp  separation was normal.  Doppler:  Transvalvular velocity was within  the normal range. There was no stenosis. There was trivial  regurgitation.    Peak velocity ratio of LVOT to aortic valve:  0.54. Valve area (Vmax): 1.37 cm^2. Indexed valve area (Vmax): 0.79  cm^2/m^2.    Peak gradient (S): 15 mm Hg.   -------------------------------------------------------------------  Aorta:  Aortic root: The aortic root was normal in size.  Ascending aorta: The ascending aorta was normal in size.   -------------------------------------------------------------------  Mitral valve:   Trivial prolapse, involving the posterior leaflet.  Doppler:  There was trivial regurgitation.   -------------------------------------------------------------------  Left atrium:  The atrium was normal in size.   -------------------------------------------------------------------  Right ventricle:  The cavity size was normal. Wall thickness was  normal. Systolic function was normal.   -------------------------------------------------------------------  Pulmonic valve:    The valve appears to be grossly normal.  Doppler:  There was trivial regurgitation.    -------------------------------------------------------------------  Tricuspid valve:   Structurally normal valve.   Leaflet separation  was normal.  Doppler:  Transvalvular velocity was within the normal  range. There was trivial regurgitation.   -------------------------------------------------------------------  Right atrium:  The atrium was normal in size.   -------------------------------------------------------------------  Pericardium:  The pericardium was normal in appearance.   -------------------------------------------------------------------  Systemic veins:  Inferior vena cava: The vessel was normal in size. The  respirophasic diameter changes were in the normal range (>= 50%),  consistent with normal central venous pressure.  Cardiac CT 2018: FINDINGS: Non-cardiac: See separate report from [facility].   Ascending Aorta:  Normal size.   Calcifications of the aortic valve.   Pericardium: Normal.   Coronary arteries:  Normal origin.   IMPRESSION: 1. Coronary calcium score of 119. This was 32 percentile for age and sex matched control.   2. Calcifications of the aortic valve.   Carotid doppler 05/2006:  IMPRESSION:  No significant carotid stenosis.   EKG:  EKG is  ordered today.  The ekg ordered today demonstrates NSR with LAD, q waves inferiorly and poor r-wave progression  Recent Labs: No results found for requested labs within last 8760 hours.  Recent Lipid Panel    Component Value Date/Time   CHOL 161 02/09/2020 0739   TRIG 68 02/09/2020 0739   HDL 90 02/09/2020 0739   CHOLHDL 1.8 02/09/2020 0739   LDLCALC 58 02/09/2020 0739     Physical Exam:    VS:  There were no vitals taken for this visit.    Wt Readings from Last 3 Encounters:  01/22/21 145 lb 1.6 oz (65.8 kg)  12/24/19 146 lb (66.2 kg)  12/10/16 148 lb 4 oz (67.2 kg)     GEN:  Well nourished, well developed in no acute distress HEENT: Normal NECK: No JVD; No carotid  bruits CARDIAC: RRR, 3/6 crescendo-descrescendo systolic murmur that peaks mid-systole with preserved A2 RESPIRATORY:  Clear to auscultation without rales, wheezing or rhonchi  ABDOMEN: Soft, non-tender, non-distended MUSCULOSKELETAL:  No edema; No deformity  SKIN: Warm and dry NEUROLOGIC:  Alert and oriented x 3 PSYCHIATRIC:  Normal affect   ASSESSMENT:    No diagnosis found.  PLAN:    In order of problems listed above:  #Moderate AS: #Mild Aortic Regurgitation: TTE 01/2021 with interval progression of moderate AS (still in moderate range however) with mean gradient , Vmax 3.39m/s, DI 0.33, AVA 0.83. Patient reports decreased exercise capacity, fatigue and SOB.  -Refer to structural team (? Progress trial candidate)  #Coronary calcification: #Strong family history CAD: Patient with coronary calcium score 119 in 2018 consistent with 76% for age, gender matched controls. Now with worsening exercise capacity and fatigue in the setting of moderate AS. Will refer to structural team as above.  -Refer to structural team as above; may need cath -Continue lipitor  daily  #Scleroderma #RA: #Sjogren's #Raynauds Normal RV size and function on TTE. PFTs normal 2018. Followed closely by rheum. -Follow-up with rheum as scheduled -Continue immunosuppressants   Medication Adjustments/Labs and Tests Ordered: Current medicines are reviewed at length with the patient today.  Concerns regarding medicines are outlined above.  No orders of the defined types were placed in this encounter.  No orders of the defined types were placed in this encounter.   There are no Patient Instructions on file for this visit.    Signed, Meriam Sprague, MD  03/17/2021 7:34 AM    [facility]

## 2021-03-19 ENCOUNTER — Other Ambulatory Visit: Payer: Self-pay

## 2021-03-19 ENCOUNTER — Ambulatory Visit (INDEPENDENT_AMBULATORY_CARE_PROVIDER_SITE_OTHER): Payer: Medicare Other | Admitting: Cardiology

## 2021-03-19 ENCOUNTER — Encounter: Payer: Self-pay | Admitting: Cardiology

## 2021-03-19 VITALS — BP 120/74 | HR 78 | Ht 64.0 in | Wt 146.4 lb

## 2021-03-19 DIAGNOSIS — I951 Orthostatic hypotension: Secondary | ICD-10-CM

## 2021-03-19 DIAGNOSIS — I351 Nonrheumatic aortic (valve) insufficiency: Secondary | ICD-10-CM | POA: Diagnosis not present

## 2021-03-19 DIAGNOSIS — M3500 Sicca syndrome, unspecified: Secondary | ICD-10-CM

## 2021-03-19 DIAGNOSIS — I2510 Atherosclerotic heart disease of native coronary artery without angina pectoris: Secondary | ICD-10-CM

## 2021-03-19 DIAGNOSIS — I350 Nonrheumatic aortic (valve) stenosis: Secondary | ICD-10-CM

## 2021-03-19 DIAGNOSIS — I2584 Coronary atherosclerosis due to calcified coronary lesion: Secondary | ICD-10-CM

## 2021-03-19 DIAGNOSIS — M349 Systemic sclerosis, unspecified: Secondary | ICD-10-CM

## 2021-03-19 NOTE — Progress Notes (Signed)
Cardiology Office Note:    Date:  03/19/2021   ID:  Regina Tate, Regina Tate 1945-08-21, MRN [id number]  PCP:  Kelton Pillar, MD  [facility] Cardiologist:  Freada Bergeron, MD  [facility] Electrophysiologist:  None   Referring MD: Kelton Pillar, MD   History of Present Illness:    Regina Tate is a 76 y.o. female with a histiry of RA, scleroderma, and moderate aortic stenosis who was initially referred for moderate AS who now returns to clinic for follow-up.  Patient initially saw Dr. Haroldine Laws in 2018 for possible enrollment into the pulmonary HTN screening program. At that time, she denied any history of cardiac problems. She was active with walking and yoga with no exertional symptoms. Has a very strong family history of CAD. TTE and PFTs at that time without evidence of PAH or pulmonary fibrosis but noted mild aortic stenosis with peak gradient 74mmHg. She was recommended for yearly follow-up.   She underwent TTE on 11/30/19 which showed interval progression in AS with severe valvular thickening and calcification, moderate AS with mean gradient 12mmHg and AVA 1.4cm2. LVEF remained normal at 60-65%. Ca score 119.  When she saw me in clinic on 11/2019, she was doing well without exertional or HF symptoms. She saw Coletta Memos on 01/22/21 where she continued to feel well. TTE was ordered for serial monitoring of AS.   TTE on 02/02/21 showed EF 60-65%, G1DD, trivial MR, mild AR, moderate AS with AVA 0.83cm, mean gradient 49mmHg, Vmax 3.3, DI 0.33. She called clinic on 03/14/21 with symptoms of decreased exercise capacity, fatigue and SOB. She now presents to clinic for further evaluation.  Today, she states she has been noticing progressive fatigue since the beginning of the year. She has to push herself to complete daily activities as she is feeling more "wiped out." She can complete water aerobics but cannot finish a 30 minute session of Silver Sneakers  cardio like she used to. She also has been having episodes of intermittent lightheadedness when changing positons or after a hot shower. Yesterday,  she had to lay on the bed right after she showered as she felt like she was doing to faint. The patient denies chest pain, chest pressure, palpitations, PND, orthopnea, or leg swelling. Denies cough, fever, chills. Denies nausea, vomiting. Denies snoring.  Family history: Sister had CVA 84. Father died MI 58, Aunt 80 CVA, Grandfather died 27 with MI, Mother died at 55 with CHF.   Past Medical History:  Diagnosis Date   Allergic rhinitis    Allergies    CREST syndrome (HCC)    Deep vein thrombosis (DVT) (HCC)    GERD (gastroesophageal reflux disease)    Heart murmur    Heart murmur    Hypercholesteremia    Hyperlipidemia    Osteopenia    RA (rheumatoid arthritis) (HCC)    Raynaud's syndrome without gangrene    Scleroderma (HCC)    Sjogren's syndrome with keratoconjunctivitis sicca (HCC)    Vitamin D deficiency     No past surgical history on file.  Current Medications: Current Meds  Medication Sig   amLODipine (NORVASC) 5 MG tablet Take 5 mg by mouth daily.   atorvastatin (LIPITOR) 20 MG tablet Take 1 tablet (20 mg total) by mouth daily.   Biotin 5 MG CAPS Take 1 capsule by mouth daily.   Bromelains 500 MG TABS Take by mouth daily.   CALCIUM PO Take 1 tablet by mouth daily.   Cholecalciferol (VITAMIN D)  2000 units CAPS Take 1 capsule by mouth daily.   ENBREL SURECLICK 50 MG/ML injection Inject 1 mL into the skin once a week.   famotidine (PEPCID) 40 MG tablet Take 40 mg by mouth 3 (three) times a week.   Flaxseed, Linseed, (FLAX SEED OIL) 1300 MG CAPS Take 1 capsule by mouth daily.   fluticasone (FLONASE) 50 MCG/ACT nasal spray Place 1 spray into both nostrils daily.   folic acid (FOLVITE) 1 MG tablet Take 1 mg by mouth daily.   hydroxychloroquine (PLAQUENIL) 200 MG tablet Take 2 tablets by mouth daily.   methotrexate (RHEUMATREX)  2.5 MG tablet Take 8 tablets by mouth once a week.   Multiple Vitamin (MULTIVITAMIN) tablet Take 1 tablet by mouth daily.   Omega-3 Fatty Acids (FISH OIL) 1000 MG CAPS Take 1 capsule by mouth daily.   omeprazole (PRILOSEC) 40 MG capsule Take 1 capsule by mouth 4 (four) times a week.   pilocarpine (SALAGEN) 5 MG tablet Take 1 tablet by mouth 2 (two) times daily.   Probiotic Product (PROBIOTIC PO) Take 1 tablet by mouth daily.   Turmeric 450 MG CAPS Take 1 tablet by mouth daily.     Allergies:   Patient has no known allergies.   Social History   Socioeconomic History   Marital status: Divorced    Spouse name: Not on file   Number of children: Not on file   Years of education: Not on file   Highest education level: Not on file  Occupational History   Not on file  Tobacco Use   Smoking status: Never   Smokeless tobacco: Never  Substance and Sexual Activity   Alcohol use: Not on file   Drug use: Not on file   Sexual activity: Not on file  Other Topics Concern   Not on file  Social History Narrative   Not on file   Social Determinants of Health   Financial Resource Strain: Not on file  Food Insecurity: Not on file  Transportation Needs: Not on file  Physical Activity: Not on file  Stress: Not on file  Social Connections: Not on file     Family History: Sister had CVA 71. Father died MI 14, Aunt 36 CVA, Grandfather died 78 with MI, Mother died at 64 with CHF.   ROS:   Please see the history of present illness.    Review of Systems  Constitutional:  Positive for malaise/fatigue. Negative for chills and fever.  HENT:  Positive for sore throat (Tonsil stones). Negative for hearing loss.   Eyes:  Negative for blurred vision.  Respiratory:  Negative for cough.   Cardiovascular:  Negative for chest pain, palpitations, orthopnea, claudication, leg swelling and PND.  Gastrointestinal:  Negative for nausea and vomiting.  Genitourinary:  Negative for dysuria.  Musculoskeletal:   Negative for joint pain and myalgias.  Skin:  Negative for rash.  Neurological:  Positive for dizziness. Negative for loss of consciousness.  Endo/Heme/Allergies:  Does not bruise/bleed easily.  Psychiatric/Behavioral:  Negative for depression.    EKGs/Labs/Other Studies Reviewed:    The following studies were reviewed today: TTE 02-07-2021: IMPRESSIONS   1. Left ventricular ejection fraction, by estimation, is 60 to 65%. Left ventricular ejection fraction by 3D volume is 60 %. The left ventricle has normal function. The left ventricle has no regional wall motion abnormalities. There is mild concentric left ventricular hypertrophy. Left ventricular diastolic parameters are consistent with Grade I diastolic dysfunction (impaired relaxation).   2. Right ventricular  systolic function is normal. The right ventricular size is normal.   3. The mitral valve is normal in structure. Trivial mitral valve regurgitation. No evidence of mitral stenosis.   4. The aortic valve is calcified. There is severe calcifcation of the aortic valve. There is severe thickening of the aortic valve. Aortic valve regurgitation is mild. Moderate aortic valve stenosis. AVA 0.83cm2 by continuity, mean gradient 49mmHg, Vmax  3.3 m/s, DI 0.33   5. Aortic dilatation noted. There is mild dilatation of the ascending aorta, measuring 36 mm.   6. The inferior vena cava is normal in size with greater than 50% respiratory variability, suggesting right atrial pressure of 3 mmHg.   7. Left atrial size was mildly dilated.   Comparison(s): Prior TTE in 11/2019 showed EF 60%, GLS -22.6%, AS mean gradient 15 mmHg, peak gradient 23.80mmHg, mild AI. Compared to prior, the mean AoV gradient has increased.   TTE 11/30/19: IMPRESSIONS   1. Left ventricular ejection fraction, by estimation, is 60 to 65%. The left ventricle has normal function. The left ventricle has no regional wall motion abnormalities. Left ventricular diastolic parameters are  consistent with Grade I diastolic dysfunction (impaired relaxation). Elevated left atrial pressure. The average left ventricular global longitudinal strain is 22.6 %. The global longitudinal strain is normal.   2. Right ventricular systolic function is normal. The right ventricular size is normal.   3. Left atrial size was mildly dilated.   4. The mitral valve is normal in structure. Trivial mitral valve regurgitation. No evidence of mitral stenosis.   5. Tricuspid valve regurgitation is mild to moderate.   6. The aortic valve is normal in structure. There is severe calcifcation of the aortic valve. There is severe thickening of the aortic valve. Aortic valve regurgitation is mild. Moderate aortic valve stenosis. Aortic valve mean gradient measures 15.0 mmHg.   7. The inferior vena cava is normal in size with greater than 50% respiratory variability, suggesting right atrial pressure of 3 mmHg.   TTE 12/10/16: Study Conclusions   - Left ventricle: The cavity size was normal. Wall thickness was normal. Systolic function was normal. The estimated ejection fraction was in the range of 55% to 60%. Wall motion was normal;    there were no regional wall motion abnormalities. Doppler    parameters are consistent with abnormal left ventricular    relaxation (grade 1 diastolic dysfunction).  - Aortic valve: There was trivial regurgitation. Valve area (Vmax): 1.37 cm^2.  - Mitral valve: Trivial prolapse, involving the posterior leaflet.   -------------------------------------------------------------------  Study data:  No prior study was available for comparison.  Study  status:  Routine.  Procedure:  Transthoracic echocardiography.  Image quality was adequate.  Study completion:  There were no  complications.          Transthoracic echocardiography.  M-mode,  complete 2D, spectral Doppler, and color Doppler.  Birthdate:  Patient birthdate: [birth year].  Age:  Patient is 76 yr old.  Sex:  Gender:  female.    BMI: 24.6 kg/m^2.  Blood pressure:     105/69  Patient status:  Outpatient.  Study date:  Study date: 12/10/2016.  Study time: 09:36 AM.  Location:  Echo laboratory.   -------------------------------------------------------------------   -------------------------------------------------------------------  Left ventricle:  The cavity size was normal. Wall thickness was  normal. Systolic function was normal. The estimated ejection  fraction was in the range of 55% to 60%. Wall motion was normal;  there were no regional wall motion abnormalities. Doppler  parameters are consistent with abnormal left ventricular relaxation  (grade 1 diastolic dysfunction).   -------------------------------------------------------------------  Aortic valve:   Trileaflet; mildly calcified leaflets. Cusp  separation was normal.  Doppler:  Transvalvular velocity was within  the normal range. There was no stenosis. There was trivial  regurgitation.    Peak velocity ratio of LVOT to aortic valve:  0.54. Valve area (Vmax): 1.37 cm^2. Indexed valve area (Vmax): 0.79  cm^2/m^2.    Peak gradient (S): 15 mm Hg.   -------------------------------------------------------------------  Aorta:  Aortic root: The aortic root was normal in size.  Ascending aorta: The ascending aorta was normal in size.   -------------------------------------------------------------------  Mitral valve:   Trivial prolapse, involving the posterior leaflet.  Doppler:  There was trivial regurgitation.   -------------------------------------------------------------------  Left atrium:  The atrium was normal in size.   -------------------------------------------------------------------  Right ventricle:  The cavity size was normal. Wall thickness was  normal. Systolic function was normal.   -------------------------------------------------------------------  Pulmonic valve:    The valve appears to be grossly normal.  Doppler:   There was trivial regurgitation.   -------------------------------------------------------------------  Tricuspid valve:   Structurally normal valve.   Leaflet separation  was normal.  Doppler:  Transvalvular velocity was within the normal  range. There was trivial regurgitation.   -------------------------------------------------------------------  Right atrium:  The atrium was normal in size.   -------------------------------------------------------------------  Pericardium:  The pericardium was normal in appearance.   -------------------------------------------------------------------  Systemic veins:  Inferior vena cava: The vessel was normal in size. The  respirophasic diameter changes were in the normal range (>= 50%),  consistent with normal central venous pressure.  Cardiac CT 2018: FINDINGS: Non-cardiac: See separate report from [facility].   Ascending Aorta:  Normal size.   Calcifications of the aortic valve.   Pericardium: Normal.   Coronary arteries:  Normal origin.   IMPRESSION: 1. Coronary calcium score of 119. This was 84 percentile for age and sex matched control.   2. Calcifications of the aortic valve.   Carotid doppler 05/2006: IMPRESSION:  No significant carotid stenosis.   EKG:   03/19/21: Sinus rhythm, rate 78 bpm 12/24/19: NSR with LAD, q waves inferiorly and poor r-wave progression  Recent Labs: No results found for requested labs within last 8760 hours.  Recent Lipid Panel    Component Value Date/Time   CHOL 161 02/09/2020 0739   TRIG 68 02/09/2020 0739   HDL 90 02/09/2020 0739   CHOLHDL 1.8 02/09/2020 0739   LDLCALC 58 02/09/2020 0739     Physical Exam:    VS:  BP 120/74    Pulse 78    Ht 5\' 4"  (1.626 m)    Wt 146 lb 6.4 oz (66.4 kg)    BMI 25.13 kg/m     Wt Readings from Last 3 Encounters:  03/19/21 146 lb 6.4 oz (66.4 kg)  01/22/21 145 lb 1.6 oz (65.8 kg)  12/24/19 146 lb (66.2 kg)     GEN:  Well nourished, well  developed in no acute distress HEENT: Normal NECK: No JVD; No carotid bruits CARDIAC: RRR, 3/6 crescendo-descrescendo systolic murmur that peaks mid-systole with preserved A2 RESPIRATORY:  Clear to auscultation without rales, wheezing or rhonchi  ABDOMEN: Soft, non-tender, non-distended MUSCULOSKELETAL:  No edema; No deformity  SKIN: Warm and dry NEUROLOGIC:  Alert and oriented x 3 PSYCHIATRIC:  Normal affect   ASSESSMENT:    1. Moderate aortic stenosis   2. Aortic valve insufficiency, etiology of cardiac valve disease unspecified  3. Coronary artery calcification of native artery   4. Sjogren's syndrome, with unspecified organ involvement ([place])   5. Scleroderma ([place])   6. Orthostatic hypotension     PLAN:    In order of problems listed above:  #Moderate AS: #Mild Aortic Regurgitation: TTE 01/2021 with interval progression of moderate AS (still in moderate range however) with mean gradient 24mmHg, Vmax 3.23m/s, DI 0.33, AVA 0.83. Patient reports decreased exercise capacity, fatigue and SOB.  May be related to underlying moderate AS, however, will also need to assess for underlying CAD. Discussed with Dr. Ali Lowe and will plan to have Structural team evaluate for possible enrollment into Progress Trial and cath with them. Patient amenable to the plan and understands that this does not necessarily mean she will have a valve replacement.  -Refer to Structural Team for possible enrollment in Progress Trial -Will plan for cath with them as part of the trial enrollment; will need ischemic evaluation regardless if patient chooses to enroll in trial  #Coronary calcification: #Strong family history CAD: Patient with coronary calcium score 119 in 2018 consistent with 76% for age, gender matched controls. Now with worsening exercise capacity and fatigue in the setting of moderate AS. Will refer to structural team as above.  -Refer to structural team as above with plans for cath  -Continue  lipitor 20mg  daily  #Orthostasis: Patient with lightheadedness with changing positions and with hot showers. Likely worsened with moderate AS due to drop in preload. Discussed importance of hydration and ensuring now low blood pressures at home.  -Increase hydration -Slow position changes -Pre-load sensitive due to AS   #Scleroderma #RA: #Sjogren's #Raynauds Normal RV size and function on TTE. PFTs normal 2018. Followed closely by rheum. -Follow-up with rheum as scheduled -Continue immunosuppressants   Medication Adjustments/Labs and Tests Ordered: Current medicines are reviewed at length with the patient today.  Concerns regarding medicines are outlined above.  Orders Placed This Encounter  Procedures   Ambulatory referral to Structural Heart/Valve Clinic (only at [place])   EKG 12-Lead   No orders of the defined types were placed in this encounter.   Patient Instructions  Medication Instructions:   Your physician recommends that you continue on your current medications as directed. Please refer to the Current Medication list given to you today.  *If you need a refill on your cardiac medications before your next appointment, please call your pharmacy*   Lab Work:  None ordered.  If you have labs (blood work) drawn today and your tests are completely normal, you will receive your results only by: [place] (if you have MyChart) OR A paper copy in the mail If you have any lab test that is abnormal or we need to change your treatment, we will call you to review the results.   Testing/Procedures:  None ordered.   Follow-Up: At [facility], you and your health needs are our priority.  As part of our continuing mission to provide you with exceptional heart care, we have created designated Provider Care Teams.  These Care Teams include your primary Cardiologist (physician) and Advanced Practice Providers (APPs -  Physician Assistants and Nurse Practitioners)  who all work together to provide you with the care you need, when you need it.  We recommend signing up for the patient portal called "MyChart".  Sign up information is provided on this After Visit Summary.  MyChart is used to connect with patients for Virtual Visits (Telemedicine).  Patients are able to view lab/test results,  encounter notes, upcoming appointments, etc.  Non-urgent messages can be sent to your provider as well.   To learn more about what you can do with MyChart, go to [email or web address].    Your next appointment:   3 month(s)  The format for your next appointment:   In Person  Provider:   Freada Bergeron, MD     Other Instructions  You have been referred to to the [facility].  Someone from that office will call you.      I,Mykaella Javier,acting as a scribe for Freada Bergeron, MD.,have documented all relevant documentation on the behalf of Freada Bergeron, MD,as directed by  Freada Bergeron, MD while in the presence of Freada Bergeron, MD.  I, Freada Bergeron, MD, have reviewed all documentation for this visit. The documentation on 03/19/21 for the exam, diagnosis, procedures, and orders are all accurate and complete.   Signed, Freada Bergeron, MD  03/19/2021 11:13 AM    [place]

## 2021-03-19 NOTE — Patient Instructions (Signed)
Medication Instructions:   Your physician recommends that you continue on your current medications as directed. Please refer to the Current Medication list given to you today.  *If you need a refill on your cardiac medications before your next appointment, please call your pharmacy*   Lab Work:  None ordered.  If you have labs (blood work) drawn today and your tests are completely normal, you will receive your results only by: MyChart Message (if you have MyChart) OR A paper copy in the mail If you have any lab test that is abnormal or we need to change your treatment, we will call you to review the results.   Testing/Procedures:  None ordered.   Follow-Up: At [facility], you and your health needs are our priority.  As part of our continuing mission to provide you with exceptional heart care, we have created designated Provider Care Teams.  These Care Teams include your primary Cardiologist (physician) and Advanced Practice Providers (APPs -  Physician Assistants and Nurse Practitioners) who all work together to provide you with the care you need, when you need it.  We recommend signing up for the patient portal called "MyChart".  Sign up information is provided on this After Visit Summary.  MyChart is used to connect with patients for Virtual Visits (Telemedicine).  Patients are able to view lab/test results, encounter notes, upcoming appointments, etc.  Non-urgent messages can be sent to your provider as well.   To learn more about what you can do with MyChart, go to [email or web address].    Your next appointment:   3 month(s)  The format for your next appointment:   In Person  Provider:   Meriam SpragueHeather E Pemberton, MD     Other Instructions  You have been referred to to the Structural Heart Clinic.  Someone from that office will call you.

## 2021-03-20 NOTE — Progress Notes (Addendum)
Patient ID: Regina Tate MRN: [id number] DOB/AGE: 29-May-1945 76 y.o.  Primary Care Physician:Griffin, Consuella Lose, MD Primary Cardiologist: Laurance Flatten, MD   FOCUSED CARDIOVASCULAR PROBLEM LIST:   1.  Moderate aortic stenosis and aortic valve area 0.83 cm grade, mean gradient 26 mmHg, peak velocity of 3.3 m/s and dimensionless index of 0.33 with normal ejection fraction; EKG demonstrates no bundle-branch blocks 2.  Sjogren's syndrome 3.  Scleroderma 4.  Raynaud's syndrome 5.  Rheumatoid arthritis on Enbrel, methotrexate, and plaquenil   HISTORY OF PRESENT ILLNESS: The patient is a 76 y.o. female with the indicated medical history here for recommendations regarding dyspnea in the context of aortic valvular disease that is moderate.  Patient had seen Dr. Shari Prows recently.  She reports decreased exercise capacity, fatigue, and shortness of breath.  She used to be able to exercise quite vigorously.  She would do water aerobics and Silver sneakers at home.  Apparently over the last few months she has become more debilitated.  She endorses rather progressive shortness of breath and fatigue.  She occasionally gets lightheaded when she goes from sitting to standing quickly but has had no frank syncope.  She has required no hospitalizations or emergency room visits.  She has had no palpitations paroxysmal nocturnal dyspnea or orthopnea per se but she does have trouble sleeping but is unable to tell me exactly the reason for this.  She lives with a roommate that she is known for 30 years.  She has good social support.  She wants to be more active but her symptoms have kept her from this.  She has her teeth cleaned 3 times a year due to Sjogren's and reports no issues with her dental health.  She has had no recent flares of her rheumatologic diseases.  She has had no fevers or chills or any signs of infection recently.  Past Medical History:  Diagnosis Date   Allergic rhinitis     Allergies    CREST syndrome (HCC)    Deep vein thrombosis (DVT) (HCC)    GERD (gastroesophageal reflux disease)    Heart murmur    Heart murmur    Hypercholesteremia    Hyperlipidemia    Osteopenia    RA (rheumatoid arthritis) (HCC)    Raynaud's syndrome without gangrene    Scleroderma (HCC)    Sjogren's syndrome with keratoconjunctivitis sicca (HCC)    Vitamin D deficiency     History reviewed. No pertinent surgical history.  Family History  Problem Relation Age of Onset   Breast cancer Neg Hx     Social History   Socioeconomic History   Marital status: Divorced    Spouse name: Not on file   Number of children: Not on file   Years of education: Not on file   Highest education level: Not on file  Occupational History   Not on file  Tobacco Use   Smoking status: Never   Smokeless tobacco: Never  Substance and Sexual Activity   Alcohol use: Not on file   Drug use: Not on file   Sexual activity: Not on file  Other Topics Concern   Not on file  Social History Narrative   Not on file   Social Determinants of Health   Financial Resource Strain: Not on file  Food Insecurity: Not on file  Transportation Needs: Not on file  Physical Activity: Not on file  Stress: Not on file  Social Connections: Not on file  Intimate Partner Violence: Not on file  Prior to Admission medications   Medication Sig Start Date End Date Taking? Authorizing Provider  amLODipine (NORVASC) 5 MG tablet Take 5 mg by mouth daily. 11/12/16   [provider]  atorvastatin (LIPITOR) 20 MG tablet Take 1 tablet (20 mg total) by mouth daily. 12/25/20 03/25/21  Freada Bergeron, MD  Biotin 5 MG CAPS Take 1 capsule by mouth daily.    [provider]  Bromelains 500 MG TABS Take by mouth daily.    [provider]  CALCIUM PO Take 1 tablet by mouth daily.    [provider]  Cholecalciferol (VITAMIN D) 2000 units CAPS Take 1 capsule by mouth daily.    [provider]  ENBREL SURECLICK 50 MG/ML injection Inject 1 mL into the skin once a week. 09/24/16   [provider]  famotidine (PEPCID) 40 MG tablet Take 40 mg by mouth 3 (three) times a week.    [provider]  Flaxseed, Linseed, (FLAX SEED OIL) 1300 MG CAPS Take 1 capsule by mouth daily.    [provider]  fluticasone (FLONASE) 50 MCG/ACT nasal spray Place 1 spray into both nostrils daily.    [provider]  folic acid (FOLVITE) 1 MG tablet Take 1 mg by mouth daily. 11/11/16   [provider]  hydroxychloroquine (PLAQUENIL) 200 MG tablet Take 2 tablets by mouth daily. 11/11/16   [provider]  methotrexate (RHEUMATREX) 2.5 MG tablet Take 8 tablets by mouth once a week. 11/13/16   [provider]  Multiple Vitamin (MULTIVITAMIN) tablet Take 1 tablet by mouth daily.    [provider]  Omega-3 Fatty Acids (FISH OIL) 1000 MG CAPS Take 1 capsule by mouth daily.    [provider]  omeprazole (PRILOSEC) 40 MG capsule Take 1 capsule by mouth 4 (four) times a week. 11/20/16   [provider]  pilocarpine (SALAGEN) 5 MG tablet Take 1 tablet by mouth 2 (two) times daily. 11/12/16   [provider]  Probiotic Product (PROBIOTIC PO) Take 1 tablet by mouth daily.    [provider]  Turmeric 450 MG CAPS Take 1 tablet by mouth daily.    [provider]    Allergies  Allergen Reactions   Molds & Smuts     Itching eyes, congestion, headache   Pollen Extract     Itching eyes, congestion, headache    REVIEW OF SYSTEMS:  General: no fevers/chills/night sweats Eyes: no blurry vision, diplopia, or amaurosis ENT: no sore throat or hearing loss Resp: no cough, wheezing, or hemoptysis CV: no edema or palpitations GI: no abdominal pain, nausea, vomiting, diarrhea, or constipation GU: no dysuria, frequency, or hematuria Skin: no rash Neuro: no headache, numbness, tingling, or weakness of  extremities Musculoskeletal: no joint pain or swelling Heme: no bleeding, DVT, or easy bruising Endo: no polydipsia or polyuria  BP 120/84    Pulse 94    Ht 5\' 4"  (1.626 m)    Wt 148 lb 3.2 oz (67.2 kg)    BMI 25.44 kg/m   PHYSICAL EXAM: GEN:  AO x 3 in no acute distress HEENT: normal Dentition: Normal Neck: JVP normal. +2 carotid upstrokes without bruits. No thyromegaly. Lungs: equal expansion, clear bilaterally CV: Apex is discrete and nondisplaced, RRR with 3/6 SEM  Abd: soft, non-tender, non-distended; no bruit; positive bowel sounds Ext: no edema, ecchymoses, or cyanosis Vascular: 2+ femoral pulses, 2+ radial pulses       Skin: warm and dry without  rash Neuro: CN II-XII grossly intact; motor and sensory grossly intact    DATA AND STUDIES:  EKG: Sinus rhythm without bundle-branch blocks  2D ECHO: 12/22  1. Left ventricular ejection fraction, by estimation, is 60 to 65%. Left  ventricular ejection fraction by 3D volume is 60 %. The left ventricle has  normal function. The left ventricle has no regional wall motion  abnormalities. There is mild concentric  left ventricular hypertrophy. Left ventricular diastolic parameters are  consistent with Grade I diastolic dysfunction (impaired relaxation).   2. Right ventricular systolic function is normal. The right ventricular  size is normal.   3. The mitral valve is normal in structure. Trivial mitral valve  regurgitation. No evidence of mitral stenosis.   4. The aortic valve is calcified. There is severe calcifcation of the  aortic valve. There is severe thickening of the aortic valve. Aortic valve  regurgitation is mild. Moderate aortic valve stenosis. AVA 0.83cm2 by  continuity, mean gradient 67mmHg, Vmax   3.3 m/s, DI 0.33   5. Aortic dilatation noted. There is mild dilatation of the ascending  aorta, measuring 36 mm.   6. The inferior vena cava is normal in size with greater than 50%  respiratory variability, suggesting  right atrial pressure of 3 mmHg.   7. Left atrial size was mildly dilated.   CARDIAC CATH: Pending   STS RISK CALCULATOR:  Procedure: Isolated AVR Risk of Mortality: 2.014% Renal Failure: 1.368% Permanent Stroke: 1.257% Prolonged Ventilation: 4.763% DSW Infection: 0.049% Reoperation: 3.505% Morbidity or Mortality: 7.955% Short Length of Stay: 41.866% Long Length of Stay: 3.176%  Euro Score: 1.63%  CCS angina class: 1  NHYA CLASS: 2-3    ASSESSMENT AND PLAN:   Dyspnea on exertion  Nonrheumatic aortic valve stenosis  The patient has developed symptomatic moderate aortic stenosis (stage B).  She has definitely experienced a decline in her functional capacity.  Given that her valve is squarely in the moderate stenosis range we did screen the patient for inclusion in the PROGRESS trial and she qualifies for such.  I discussed the details of this trial including randomization to either TAVR or monitoring until traditional criteria for severe aortic stenosis are achieved.  The patient is interested in participating in this trial.  I have reached out to our research center.  We will obtain CT scan cardiac catheterization has required by the trial.  Further recommendations will follow after review of these results.  I have personally reviewed the patients imaging data as summarized above.  I have reviewed the natural history of moderate aortic stenosis with the patient and family members who are present today. We have discussed that intervention for moderate aortic stenosis has not yet been rigorously studied.    All of the patient's questions were answered today. Will make further recommendations based on the results of studies outlined above.   Total time spent with patient today 60 minutes. This includes reviewing records, evaluating the patient and coordinating care.   Early Osmond, MD  03/21/2021 10:35 AM    [facility],  [place], [place]  [address] Phone: [phone]; Fax: [phone]

## 2021-03-21 ENCOUNTER — Ambulatory Visit (INDEPENDENT_AMBULATORY_CARE_PROVIDER_SITE_OTHER): Payer: Medicare Other | Admitting: Internal Medicine

## 2021-03-21 ENCOUNTER — Other Ambulatory Visit: Payer: Self-pay

## 2021-03-21 ENCOUNTER — Encounter: Payer: Self-pay | Admitting: Internal Medicine

## 2021-03-21 VITALS — BP 120/84 | HR 94 | Ht 64.0 in | Wt 148.2 lb

## 2021-03-21 DIAGNOSIS — Z006 Encounter for examination for normal comparison and control in clinical research program: Secondary | ICD-10-CM

## 2021-03-21 DIAGNOSIS — I350 Nonrheumatic aortic (valve) stenosis: Secondary | ICD-10-CM | POA: Diagnosis not present

## 2021-03-21 DIAGNOSIS — R0609 Other forms of dyspnea: Secondary | ICD-10-CM

## 2021-03-21 NOTE — Research (Signed)
PROGESS Informed Consent   Subject Name: Regina Tate [facility]  Subject met inclusion and exclusion criteria.  The informed consent form, study requirements and expectations were reviewed with the subject and questions and concerns were addressed prior to the signing of the consent form.  The subject verbalized understanding of the trial requirements.  The subject agreed to participate in the PROGRESS trial and signed the informed consent at 1015 on 03/21/2021.  The informed consent was obtained prior to performance of any protocol-specific procedures for the subject.  A copy of the signed informed consent was given to the subject and a copy was placed in the subject's medical record.   [name]

## 2021-03-21 NOTE — Patient Instructions (Signed)
Medication Instructions:  Your physician recommends that you continue on your current medications as directed. Please refer to the Current Medication list given to you today.   *If you need a refill on your cardiac medications before your next appointment, please call your pharmacy*   Lab Work: Bmp, Cbc - Drawn by research center   If you have labs (blood work) drawn today and your tests are completely normal, you will receive your results only by: [facility] [place] (if you have MyChart) OR A paper copy in the mail If you have any lab test that is abnormal or we need to change your treatment, we will call you to review the results.   Testing/Procedures: Your physician has requested that you have a cardiac catheterization. Cardiac catheterization is used to diagnose and/or treat various heart conditions. Doctors may recommend this procedure for a number of different reasons. The most common reason is to evaluate chest pain. Chest pain can be a symptom of coronary artery disease (CAD), and cardiac catheterization can show whether plaque is narrowing or blocking your hearts arteries. This procedure is also used to evaluate the valves, as well as measure the blood flow and oxygen levels in different parts of your heart. For further information please visit [email or web address]. Please follow instruction sheet, as given.   Follow-Up: Someone from our structural team will contact you with follow up appointments   Other [facility] OFFICE [place], [place] [address] Dept: [phone] Loc: [place]  03/21/2021  You are scheduled for a Cardiac Catheterization on We will call you with a time and date   1. Please arrive at the [facility] (Main Entrance A) at [facility]: [address] [place], [place] [address] at (This time is two hours before  your procedure to ensure your preparation). Free valet parking service is available.   Special note: Every effort is made to have your procedure done on time. Please understand that emergencies sometimes delay scheduled procedures.  2. Diet: Do not eat solid foods after midnight.  The patient may have clear liquids until 5am upon the day of the procedure.   4. Medication instructions in preparation for your procedure:   On the morning of your procedure, take your Aspirin and any morning medicines NOT listed above.  You may use sips of water.  5. Plan for one night stay--bring personal belongings. 6. Bring a current list of your medications and current insurance cards. 7. You MUST have a responsible person to drive you home. 8. Someone MUST be with you the first 24 hours after you arrive home or your discharge will be delayed. 9. Please wear clothes that are easy to get on and off and wear slip-on shoes.  Thank you for allowing [place] to care for you!   -- [facility]

## 2021-03-22 ENCOUNTER — Other Ambulatory Visit: Payer: Self-pay | Admitting: Cardiology

## 2021-03-22 ENCOUNTER — Encounter: Payer: Self-pay | Admitting: Cardiology

## 2021-03-22 LAB — BASIC METABOLIC PANEL
BUN/Creatinine Ratio: 21 (ref 12–28)
BUN: 16 mg/dL (ref 8–27)
CO2: 24 mmol/L (ref 20–29)
Calcium: 8.9 mg/dL (ref 8.7–10.3)
Chloride: 95 mmol/L — ABNORMAL LOW (ref 96–106)
Creatinine, Ser: 0.77 mg/dL (ref 0.57–1.00)
Glucose: 93 mg/dL (ref 70–99)
Potassium: 4.7 mmol/L (ref 3.5–5.2)
Sodium: 131 mmol/L — ABNORMAL LOW (ref 134–144)
eGFR: 81 mL/min/{1.73_m2} (ref >59)

## 2021-03-22 MED ORDER — METOPROLOL TARTRATE 25 MG PO TABS: 25.0000 mg | ORAL_TABLET | Freq: Once | ORAL | 0 refills | Status: DC

## 2021-04-10 ENCOUNTER — Other Ambulatory Visit: Payer: Self-pay

## 2021-04-10 ENCOUNTER — Ambulatory Visit (HOSPITAL_COMMUNITY)
Admission: RE | Admit: 2021-04-10 | Discharge: 2021-04-10 | Disposition: A | Discharge status: Home / Self Care | Payer: Medicare Other | Source: Ambulatory Visit | Attending: Internal Medicine | Admitting: Internal Medicine

## 2021-04-10 DIAGNOSIS — I350 Nonrheumatic aortic (valve) stenosis: Secondary | ICD-10-CM | POA: Insufficient documentation

## 2021-04-10 DIAGNOSIS — Z006 Encounter for examination for normal comparison and control in clinical research program: Secondary | ICD-10-CM | POA: Insufficient documentation

## 2021-04-10 DIAGNOSIS — K449 Diaphragmatic hernia without obstruction or gangrene: Secondary | ICD-10-CM | POA: Diagnosis not present

## 2021-04-10 DIAGNOSIS — I700 Atherosclerosis of aorta: Secondary | ICD-10-CM | POA: Insufficient documentation

## 2021-04-10 DIAGNOSIS — Z01818 Encounter for other preprocedural examination: Secondary | ICD-10-CM | POA: Insufficient documentation

## 2021-04-10 DIAGNOSIS — I2510 Atherosclerotic heart disease of native coronary artery without angina pectoris: Secondary | ICD-10-CM | POA: Insufficient documentation

## 2021-04-10 DIAGNOSIS — I517 Cardiomegaly: Secondary | ICD-10-CM | POA: Insufficient documentation

## 2021-04-10 MED ORDER — IOHEXOL 350 MG/ML SOLN
100.0000 mL | Freq: Once | INTRAVENOUS | Status: AC | PRN
  Administered 2021-04-10: 100 mL via INTRAVENOUS

## 2021-04-10 NOTE — Progress Notes (Signed)
Progress screening subject 002

## 2021-04-13 ENCOUNTER — Other Ambulatory Visit: Payer: Self-pay

## 2021-04-13 ENCOUNTER — Ambulatory Visit
Admission: RE | Admit: 2021-04-13 | Discharge: 2021-04-13 | Disposition: A | Discharge status: Home / Self Care | Payer: Medicare Other | Source: Ambulatory Visit | Attending: Family Medicine | Admitting: Family Medicine

## 2021-04-13 DIAGNOSIS — M8588 Other specified disorders of bone density and structure, other site: Secondary | ICD-10-CM

## 2021-04-16 ENCOUNTER — Encounter: Payer: Self-pay | Admitting: Physician Assistant

## 2021-04-16 DIAGNOSIS — Z006 Encounter for examination for normal comparison and control in clinical research program: Secondary | ICD-10-CM

## 2021-04-16 NOTE — Research (Signed)
INCLUSION:  _94  74 years of age or older at time of randomization  _1   Moderate AS per one of the following as assessed by the Echo Core Lab:    A: Aortic valve area (AVA) / Aortic valve area index (AVAi):   AVA 1.0 - 1.5 cm2; OR   AVA < 1.0 with AVAi > 0.6 cm2/m2 if BMI < 30 kg/m2; OR   AVA < 1.0 with AVAi > 0.5 cm2/m2 if BMI ? 30 kg/m2, OR   AVA > 1.5 with AVAi < 0.9 cm2/m2 if BMI < 30 kg/m2; OR   AVA > 1.5 with AVAi < 0.8 cm2/m2 if BMI ? 30 kg/m2   AND  Peak velocity 3.0 - < 4.0 m/s OR mean gradient 20 - < 40 mmHg  OR  B: Subjects who only meet one of the criteria in 2A on resting echo due to low flow state (LVEF < 50% or SVI < 35 mL/m2) are eligible if both criteria are met following dobutamine stress echo (DSE). Note: Subjects with inconclusive results from DSE are eligible upon approval by the Case Review Board.  OR  C: Subjects who only meet one of the criteria in 2A on resting echo and have normal flow (LVEF ? 50% and SVi ? 35 mL/m2) are eligible if non-contrast CT calcium score is < 1200 AU for women or < 2000 AU for men.  _2   Subject meets at least one of the following criteria:   Valve-related symptoms including New York Heart Association functional class ? II, dyspnea, angina, dizziness, pre-syncope, or syncope deemed to be related to AS   LVEF < 60% as assessed by the Echo Core Lab   Diastolic dysfunction (? Grade 2) as assessed by the Echo Core Lab per American Society of Echocardiography Guidelines1 (i.e., E/e' > 14, left atrium volume index > 34 mL/m2, or tricuspid regurgitation velocity > 2.8 m/sec)   Stroke volume index < 35 mL/m2 as assessed by the Echo Core Lab  Persistent atrial fibrillation or any episode of paroxysmal atrial fibrillation within 6 months prior to randomization   NT-Pro BNP > 3x normal   Elevated calcium score as assessed by Computed Tomography (CT) Core Lab (> 1200 AU for female and > 2000 AU for female) (only applicable for subjects eligible per  2A or 2B above)  _3  The subject or subject's legal representative has been informed of the nature of the study, agrees to its provisions, and has provided written informed consent   EXCLUSION _4  Native aortic annulus size unsuitable for the THV based on computed tomography angiography (CTA) analysis  _5  Anatomical characteristics that would preclude safe placement of the introducer sheath or safe passage of the delivery system  _6  Aortic valve is unicuspid or non-calcified  _7  Bicuspid aortic valve with an aneurysmal ascending aorta > 4.5 cm or severe raphe/leaflet calcification as assessed by CT Core Lab  _8  Pre-existing mechanical or bioprosthetic aortic valve  _9  Severe aortic regurgitation (>3+)  _10  Prior balloon aortic valvuloplasty (BAV) to treat severe AS  _11  LVEF < 20% as assessed by the Echo Core Lab  _12  Left ventricular outflow tract (LVOT) calcification that would increase the risk of annular rupture or significant paravalvular leak (PVL) post-TAVR  _13  Cardiac imaging evidence of intracardiac mass, thrombus, or vegetation  _14  Coronary or aortic valve anatomy that increases the risk of coronary artery obstruction post-TAVR  _15  Myocardial infarction within 30 days prior to randomization  _16  Hypertrophic cardiomyopathy  with sub-valvular obstruction or restrictive cardiomyopathy  _0  Any concomitant valvular disease requiring surgical or transcatheter intervention.  _1  Significant untreated coronary artery disease requiring revascularization  _2  Any surgical or transcatheter procedure within 30 days prior to randomization  _3  Active bacterial endocarditis within 180 days prior to randomization  _4  Stroke or transient ischemic attack (TIA) within 90 days prior to randomization  _5  Symptomatic carotid or vertebral artery disease or successful treatment of carotid stenosis within 30 days prior to randomization  _6  Severe chronic obstructive pulmonary disease (COPD, Forced Ejection Volume 1  [FEV1] < 50% predicted) or currently on home oxygen  _7  Hemodynamic or respiratory instability requiring inotropic or mechanical support within 30 days prior to randomization  _8  Liver disease (cirrhosis of the liver [Child-Pugh class B or C])  _9  Renal insufficiency (estimated Glomerular Filtration Rate [eGFR] < 30 mL/min/1.73 m2) and/or renal replacement therapy  _10  Significant frailty as determined by the Heart Team  _11  Leukopenia (White blood cells < 3000 cells/mL), anemia (Hemoglobin < 9 g/dL), thrombocytopenia (platelets < 50,000 cells/mL)  _12  Inability to tolerate or condition precluding treatment with antithrombotic therapy (including single antiplatelet therapy)  _13  Hypercoagulable state or other condition that increases risk of thrombosis  _14  Absolute contraindications or allergy to iodinated contrast that cannot be adequately treated with pre-medication  _15  Subject refuses blood products  _16  Body mass index (BMI) > 50 kg/m2  _17  Estimated life expectancy < 24 months  _18  Active SARS-CoV-2 infection (Coronavirus-19 [COVID-19]) or previously diagnosed with COVID-19 with sequelae that could confound endpoint assessments (as assessed by the Case Review Board)  _19  Participating in another drug or device study that has not reached its primary endpoint  _20  Subject considered to be part of a vulnerable population

## 2021-04-17 ENCOUNTER — Telehealth: Payer: Self-pay | Admitting: *Deleted

## 2021-04-17 NOTE — Telephone Encounter (Signed)
-----   Message from Sampson Goon, RN sent at 04/17/2021 11:01 AM EST ----- Regarding: FW: Heart Cath  ----- Message ----- From: Vernard Gambles, CMA Sent: 04/17/2021   7:53 AM EST To: [facility] Triage Subject: FW: Heart Cath                                 Patient has been cleared to have cath schedule. I am assisting all day In clinic and Adara Kittle is off today. Can someone call and get patient scheduled.   Thank you  ----- Message ----- From: Vernard Gambles, CMA Sent: 03/21/2021   1:46 PM EST To: Lendon Ka, RN Subject: Heart Cath                                     Just a fyi:  Dr. Lynnette Caffey ordered for pt to have a cath. Pt is currently in the research study and needs to have CT scans done prior to scheduling cath. Research states they will send a message and let us know when its okay to get patient scheduled. Pt made aware that we will call her with a date and time when we get the okay. Cath instructions where reviewed and given to patient.

## 2021-04-17 NOTE — Telephone Encounter (Signed)
Placed patient on cath lab schedule with Dr. Lynnette Caffey for 04/25/21 8:30 am.  She will need ekg and updated H&P with Dr. Lynnette Caffey at the cath lab.  She will have her labs drawn stat at her Research appointment on 04/24/21.    I called and left a message for the patient to call to review these plans.

## 2021-04-18 ENCOUNTER — Other Ambulatory Visit: Payer: Self-pay

## 2021-04-18 DIAGNOSIS — I350 Nonrheumatic aortic (valve) stenosis: Secondary | ICD-10-CM

## 2021-04-18 NOTE — Telephone Encounter (Signed)
Call returned by patient.  Reviewed plan for right and left heart cath on 04/25/21.  Reviewed all instructions and adv that they will be in MyChart for her review as well.   All questions/concerns were addressed.     She will need ekg  and updated H&P by Dr. Lynnette Caffey in Short Stay.  Updated labs will be drawn at her Research appointment tomorrow.

## 2021-04-19 ENCOUNTER — Other Ambulatory Visit: Payer: Self-pay | Admitting: *Deleted

## 2021-04-19 VITALS — BP 132/68 | HR 78 | Temp 98.4°F | Resp 16 | Wt 146.0 lb

## 2021-04-19 DIAGNOSIS — Z006 Encounter for examination for normal comparison and control in clinical research program: Secondary | ICD-10-CM

## 2021-04-19 DIAGNOSIS — I350 Nonrheumatic aortic (valve) stenosis: Secondary | ICD-10-CM

## 2021-04-19 LAB — CBC
HCT: 37.1 % (ref 36.0–46.0)
Hemoglobin: 12.2 g/dL (ref 12.0–15.0)
MCH: 30 pg (ref 26.0–34.0)
MCHC: 32.9 g/dL (ref 30.0–36.0)
MCV: 91.4 fL (ref 80.0–100.0)
Platelets: 217 10*3/uL (ref 150–400)
RBC: 4.06 MIL/uL (ref 3.87–5.11)
RDW: 16.1 % — ABNORMAL HIGH (ref 11.5–15.5)
WBC: 7.6 10*3/uL (ref 4.0–10.5)
nRBC: 0 % (ref 0.0–0.2)

## 2021-04-19 NOTE — Research (Addendum)
Progress Screening Visit    Medical History:  Past Medical History:  Diagnosis Date   Allergic rhinitis    CREST syndrome (HCC)    Deep vein thrombosis (DVT) (HCC)    GERD (gastroesophageal reflux disease)    Hypercholesteremia    Hyperlipidemia    Osteopenia    RA (rheumatoid arthritis) (HCC)    Raynaud's syndrome without gangrene    Scleroderma (HCC)    Sjogren's syndrome with keratoconjunctivitis sicca (HCC)    Vitamin D deficiency       Physical Examination      Was the physical exam performed?     General Appearance     If abnormal, clinically significant?   Describe Clinically Relevant abnormalities:    Ophthalmologic     If abnormal, clinically significant?   Describe Clinically Relevant abnormalities:     HEENT     If abnormal, clinically significant?   Describe Clinically Relevant abnormalities:     Neurologic     If abnormal, clinically significant?  Describe Clinically Relevant abnormalities:     Gastrointestinal     If abnormal, clinically significant?   Describe Clinically Relevant abnormalities:    Dermatologic     If abnormal, clinically significant?  Describe Clinically Relevant abnormalities:     Musculoskeletal    If abnormal, clinically significant?    [x]  Yes []  No (complete a protocol deviation)       [x]  Normal []  Abnormal []  Not Done  []  Yes []  No           [x]  Normal []  Abnormal []  Not Done  []  Yes []  No           [x]  Normal []  Abnormal []  Not Done  []  Yes []  No             [x] Normal  [] Abnormal  [] Not Done   [] Yes  [] No            [x] Normal  [] Abnormal  [] Not Done      [] Yes  [] No        [x] Normal  [] Abnormal  [] Not Done    [] Yes  [] No           [x] Normal  [] Abnormal  [] Not Done    [] Yes  [] No               Medications:  Current Outpatient Medications:     amLODipine (NORVASC) 5 MG tablet, Take 5 mg by mouth daily., Disp: , Rfl:    Biotin 5 MG CAPS, Take 5 mg by mouth daily., Disp: , Rfl:    Bromelains 500 MG TABS, Take 500 mg by mouth daily., Disp: , Rfl:    CALCIUM PO, Take 1,200 mg by mouth daily. Gummies, Disp: , Rfl:    Cholecalciferol (VITAMIN D) 2000 units CAPS, Take 2,000 Units by mouth daily., Disp: , Rfl:    ENBREL SURECLICK 50 MG/ML injection, Inject 50 mg into the skin once a week., Disp: , Rfl:    famotidine (PEPCID) 40 MG tablet, Take 40 mg by mouth 3 (three) times a week., Disp: , Rfl:    Flaxseed, Linseed, (FLAX SEED OIL) 1000 MG CAPS, Take 1,000 mg by mouth daily., Disp: , Rfl:    fluticasone (FLONASE) 50 MCG/ACT nasal spray, Place 1 spray into both nostrils daily., Disp: , Rfl:    folic acid (FOLVITE) 1 MG tablet, Take 1 mg by mouth daily., Disp: , Rfl:    hydroxychloroquine (PLAQUENIL) 200 MG tablet,  Take 200 mg by mouth 2 (two) times daily., Disp: , Rfl:    methotrexate (RHEUMATREX) 2.5 MG tablet, Take 10 mg by mouth 2 (two) times a week. Thursday and Friday, Disp: , Rfl:    Multiple Vitamin (MULTIVITAMIN) tablet, Take 1 tablet by mouth daily., Disp: , Rfl:    Omega-3 Fatty Acids (FISH OIL) 1000 MG CAPS, Take 2,000 mg by mouth daily., Disp: , Rfl:    omeprazole (PRILOSEC) 40 MG capsule, Take 40 mg by mouth 4 (four) times a week., Disp: , Rfl:    pilocarpine (SALAGEN) 5 MG tablet, Take 5 mg by mouth 2 (two) times daily., Disp: , Rfl:    Polyethyl Glycol-Propyl Glycol (SYSTANE) 0.4-0.3 % SOLN, Place 1 drop into both eyes daily as needed (lubricant)., Disp: , Rfl:    Probiotic Product (PROBIOTIC PO), Take 1 tablet by mouth daily., Disp: , Rfl:    Turmeric 450 MG CAPS, Take 450 mg by mouth 2 (two) times daily., Disp: , Rfl:    atorvastatin (LIPITOR) 20 MG tablet, Take 1 tablet (20 mg total) by mouth daily., Disp: 90 tablet, Rfl: 3   metoprolol tartrate (LOPRESSOR) 25 MG tablet, Take 1 tablet (25 mg total) by mouth once for 1 dose.  (Patient not taking: Reported on 04/18/2021), Disp: 1 tablet, Rfl: 0   Canadian Cardiovascular Society Score (CCS) angina grade: 1  Society of Thoracic Surgeons Score:   European System for Cardiac Operative Risk Evaluation (EuroSCORE) ll: 1.63%  Surgical Risk assessed by Heart Team:   - Low  - Intermediate  - High  - Extreme   New York Heart Association  (NYHA)   New York Heart Association (NYHA) Heart Failure Classification   Was NYHA Performed?  Yes (Complete Below)  No (Complete Protocol Deviation)    Indicate the subject's NYHA Classification at the time of the assessment:   Class I: No limitation of physical activity. Ordinary physical activity does not cause undue fatigue, palpitation or dyspnea (shortness of breath).   Class II: Slight limitation of physical activity. Comfortable at rest, but ordinary physical activity results in fatigue, palpitation or dyspnea.   Class III: Marked limitation of physical activity. Comfortable at rest, but less than ordinary activity causes fatigue, palpitation or dyspnea.   Class IV: Unable to carry out any physical activity without discomfort. Symptoms of cardiac insufficiency at rest. If any physical activity is undertaken, discomfort is increased.       Transthoracic echocardiogram (TTE), including DSE as applicable: 02/02/2021  12- lead electrocardiogram: 04/19/2021   Cardiac CTA with 3D within 1 year: 04/10/2021  PFT: N/A  Labs: Drawn 04/19/2021   KCCQ: See worksheet   [id number]: See worksheet  SF-36:  See worksheet

## 2021-04-20 LAB — COMPREHENSIVE METABOLIC PANEL
ALT: 23 IU/L (ref 0–32)
AST: 29 IU/L (ref 0–40)
Albumin/Globulin Ratio: 2.5 — ABNORMAL HIGH (ref 1.2–2.2)
Albumin: 4.3 g/dL (ref 3.7–4.7)
Alkaline Phosphatase: 70 IU/L (ref 44–121)
BUN/Creatinine Ratio: 26 (ref 12–28)
BUN: 20 mg/dL (ref 8–27)
Bilirubin Total: 0.2 mg/dL (ref 0.0–1.2)
CO2: 22 mmol/L (ref 20–29)
Calcium: 9.2 mg/dL (ref 8.7–10.3)
Chloride: 101 mmol/L (ref 96–106)
Creatinine, Ser: 0.78 mg/dL (ref 0.57–1.00)
Globulin, Total: 1.7 g/dL (ref 1.5–4.5)
Glucose: 93 mg/dL (ref 70–99)
Potassium: 5 mmol/L (ref 3.5–5.2)
Sodium: 140 mmol/L (ref 134–144)
Total Protein: 6 g/dL (ref 6.0–8.5)
eGFR: 80 mL/min/{1.73_m2} (ref >59)

## 2021-04-20 LAB — PRO B NATRIURETIC PEPTIDE: NT-Pro BNP: 193 pg/mL (ref 0–301)

## 2021-04-20 NOTE — Progress Notes (Signed)
Please review for Progress trial.

## 2021-04-20 NOTE — H&P (View-Only) (Signed)
Please review for Progress trial.

## 2021-04-23 ENCOUNTER — Telehealth: Payer: Self-pay | Admitting: *Deleted

## 2021-04-23 NOTE — Telephone Encounter (Signed)
Cardiac catheterization scheduled at [facility] for: Wednesday April 25, 2021 8:30 AM [facility] Main Entrance [facility]) at: 6:30 AM   Diet-no solid food after midnight prior to cath, clear liquids until 5 AM day of procedure.  Medication instructions for procedure: -Usual morning medications can be taken pre-cath with sips of water including aspirin 81 mg.    Must have responsible adult to drive home post procedure and be with patient first 24 hours after arriving home.  [facility] does allow one visitor to wait in the waiting room during the time you are there.   Patient reports does not currently have any new symptoms concerning for COVID-19 and no household members with COVID-19 like illness.    Reviewed procedure instructions with patient.

## 2021-04-25 ENCOUNTER — Other Ambulatory Visit: Payer: Self-pay

## 2021-04-25 ENCOUNTER — Encounter (HOSPITAL_COMMUNITY): Payer: Self-pay | Admitting: Internal Medicine

## 2021-04-25 ENCOUNTER — Encounter (HOSPITAL_COMMUNITY)
Admission: RE | Disposition: A | Discharge status: Home / Self Care | Payer: Self-pay | Source: Home / Self Care | Attending: Internal Medicine

## 2021-04-25 ENCOUNTER — Ambulatory Visit (HOSPITAL_COMMUNITY)
Admission: RE | Admit: 2021-04-25 | Discharge: 2021-04-25 | Disposition: A | Discharge status: Home / Self Care | Payer: Medicare Other | Attending: Internal Medicine | Admitting: Internal Medicine

## 2021-04-25 DIAGNOSIS — M3500 Sicca syndrome, unspecified: Secondary | ICD-10-CM | POA: Diagnosis not present

## 2021-04-25 DIAGNOSIS — R0600 Dyspnea, unspecified: Secondary | ICD-10-CM | POA: Diagnosis not present

## 2021-04-25 DIAGNOSIS — M069 Rheumatoid arthritis, unspecified: Secondary | ICD-10-CM | POA: Insufficient documentation

## 2021-04-25 DIAGNOSIS — I350 Nonrheumatic aortic (valve) stenosis: Secondary | ICD-10-CM | POA: Diagnosis not present

## 2021-04-25 DIAGNOSIS — M349 Systemic sclerosis, unspecified: Secondary | ICD-10-CM | POA: Insufficient documentation

## 2021-04-25 DIAGNOSIS — I25118 Atherosclerotic heart disease of native coronary artery with other forms of angina pectoris: Secondary | ICD-10-CM | POA: Diagnosis not present

## 2021-04-25 DIAGNOSIS — I5032 Chronic diastolic (congestive) heart failure: Secondary | ICD-10-CM | POA: Insufficient documentation

## 2021-04-25 DIAGNOSIS — Z79899 Other long term (current) drug therapy: Secondary | ICD-10-CM | POA: Insufficient documentation

## 2021-04-25 DIAGNOSIS — I7300 Raynaud's syndrome without gangrene: Secondary | ICD-10-CM | POA: Insufficient documentation

## 2021-04-25 DIAGNOSIS — Z006 Encounter for examination for normal comparison and control in clinical research program: Secondary | ICD-10-CM

## 2021-04-25 HISTORY — PX: RIGHT/LEFT HEART CATH AND CORONARY ANGIOGRAPHY: CATH118266

## 2021-04-25 LAB — POCT I-STAT 7, (LYTES, BLD GAS, ICA,H+H)
Acid-Base Excess: 1 mmol/L (ref 0.0–2.0)
Acid-Base Excess: 0 mmol/L (ref 0.0–2.0)
Bicarbonate: 26.6 mmol/L (ref 20.0–28.0)
Bicarbonate: 25.8 mmol/L (ref 20.0–28.0)
Calcium, Ion: 1.16 mmol/L (ref 1.15–1.40)
Calcium, Ion: 1.2 mmol/L (ref 1.15–1.40)
HCT: 35 % — ABNORMAL LOW (ref 36.0–46.0)
HCT: 35 % — ABNORMAL LOW (ref 36.0–46.0)
Hemoglobin: 11.9 g/dL — ABNORMAL LOW (ref 12.0–15.0)
Hemoglobin: 11.9 g/dL — ABNORMAL LOW (ref 12.0–15.0)
O2 Saturation: 82 %
O2 Saturation: 98 %
Potassium: 3.7 mmol/L (ref 3.5–5.1)
Potassium: 3.8 mmol/L (ref 3.5–5.1)
Sodium: 140 mmol/L (ref 135–145)
Sodium: 139 mmol/L (ref 135–145)
TCO2: 28 mmol/L (ref 22–32)
TCO2: 27 mmol/L (ref 22–32)
pCO2 arterial: 47.1 mmHg (ref 32–48)
pCO2 arterial: 43.5 mmHg (ref 32–48)
pH, Arterial: 7.36 (ref 7.35–7.45)
pH, Arterial: 7.382 (ref 7.35–7.45)
pO2, Arterial: 49 mmHg — ABNORMAL LOW (ref 83–108)
pO2, Arterial: 108 mmHg (ref 83–108)

## 2021-04-25 LAB — POCT I-STAT EG7
Acid-Base Excess: 0 mmol/L (ref 0.0–2.0)
Bicarbonate: 26.4 mmol/L (ref 20.0–28.0)
Calcium, Ion: 1.16 mmol/L (ref 1.15–1.40)
HCT: 34 % — ABNORMAL LOW (ref 36.0–46.0)
Hemoglobin: 11.6 g/dL — ABNORMAL LOW (ref 12.0–15.0)
O2 Saturation: 80 %
Potassium: 3.7 mmol/L (ref 3.5–5.1)
Sodium: 139 mmol/L (ref 135–145)
TCO2: 28 mmol/L (ref 22–32)
pCO2, Ven: 47.1 mmHg (ref 44–60)
pH, Ven: 7.356 (ref 7.25–7.43)
pO2, Ven: 47 mmHg — ABNORMAL HIGH (ref 32–45)

## 2021-04-25 SURGERY — RIGHT/LEFT HEART CATH AND CORONARY ANGIOGRAPHY
Anesthesia: LOCAL

## 2021-04-25 MED ORDER — LIDOCAINE HCL (PF) 1 % IJ SOLN
INTRAMUSCULAR | Status: DC | PRN
  Administered 2021-04-25 (×2): 2 mL via INTRADERMAL

## 2021-04-25 MED ORDER — SODIUM CHLORIDE 0.9% FLUSH: 3.0000 mL | Freq: Two times a day (BID) | INTRAVENOUS | Status: DC

## 2021-04-25 MED ORDER — ACETAMINOPHEN 325 MG PO TABS: 650.0000 mg | ORAL_TABLET | ORAL | Status: DC | PRN

## 2021-04-25 MED ORDER — SODIUM CHLORIDE 0.9% FLUSH: 3.0000 mL | INTRAVENOUS | Status: DC | PRN

## 2021-04-25 MED ORDER — HEPARIN (PORCINE) IN NACL 2-0.9 UNITS/ML
INTRAMUSCULAR | Status: DC | PRN
Start: 2021-04-25 — End: 2021-04-25

## 2021-04-25 MED ORDER — SODIUM CHLORIDE 0.9 % IV SOLN: 250.0000 mL | INTRAVENOUS | Status: DC | PRN

## 2021-04-25 MED ORDER — HEPARIN (PORCINE) IN NACL 1000-0.9 UT/500ML-% IV SOLN
INTRAVENOUS | Status: DC | PRN
  Administered 2021-04-25 (×2): 500 mL

## 2021-04-25 MED ORDER — SODIUM CHLORIDE 0.9 % IV SOLN: INTRAVENOUS | Status: DC

## 2021-04-25 MED ORDER — MIDAZOLAM HCL 2 MG/2ML IJ SOLN
INTRAMUSCULAR | Status: DC | PRN
  Administered 2021-04-25: 1 mg via INTRAVENOUS

## 2021-04-25 MED ORDER — ASPIRIN 81 MG PO CHEW: 81.0000 mg | CHEWABLE_TABLET | ORAL | Status: DC

## 2021-04-25 MED ORDER — HEPARIN SODIUM (PORCINE) 1000 UNIT/ML IJ SOLN
INTRAMUSCULAR | Status: AC
  Filled 2021-04-25: qty 10

## 2021-04-25 MED ORDER — VERAPAMIL HCL 2.5 MG/ML IV SOLN
INTRAVENOUS | Status: AC
  Filled 2021-04-25: qty 2

## 2021-04-25 MED ORDER — HEPARIN (PORCINE) IN NACL 1000-0.9 UT/500ML-% IV SOLN
INTRAVENOUS | Status: AC
  Filled 2021-04-25: qty 500

## 2021-04-25 MED ORDER — LABETALOL HCL 5 MG/ML IV SOLN: 10.0000 mg | INTRAVENOUS | Status: DC | PRN

## 2021-04-25 MED ORDER — VERAPAMIL HCL 2.5 MG/ML IV SOLN
INTRAVENOUS | Status: DC | PRN
  Administered 2021-04-25: 10 mL via INTRA_ARTERIAL

## 2021-04-25 MED ORDER — FENTANYL CITRATE (PF) 100 MCG/2ML IJ SOLN
INTRAMUSCULAR | Status: DC | PRN
  Administered 2021-04-25: 25 ug via INTRAVENOUS

## 2021-04-25 MED ORDER — MIDAZOLAM HCL 2 MG/2ML IJ SOLN
INTRAMUSCULAR | Status: AC
  Filled 2021-04-25: qty 2

## 2021-04-25 MED ORDER — SODIUM CHLORIDE 0.9 % WEIGHT BASED INFUSION
3.0000 mL/kg/h | INTRAVENOUS | Status: AC
  Administered 2021-04-25: 3 mL/kg/h via INTRAVENOUS

## 2021-04-25 MED ORDER — SODIUM CHLORIDE 0.9 % WEIGHT BASED INFUSION: 1.0000 mL/kg/h | INTRAVENOUS | Status: DC

## 2021-04-25 MED ORDER — FENTANYL CITRATE (PF) 100 MCG/2ML IJ SOLN
INTRAMUSCULAR | Status: AC
  Filled 2021-04-25: qty 2

## 2021-04-25 MED ORDER — ONDANSETRON HCL 4 MG/2ML IJ SOLN: 4.0000 mg | Freq: Four times a day (QID) | INTRAMUSCULAR | Status: DC | PRN

## 2021-04-25 MED ORDER — HEPARIN SODIUM (PORCINE) 1000 UNIT/ML IJ SOLN
INTRAMUSCULAR | Status: DC | PRN
  Administered 2021-04-25: 5000 [IU] via INTRAVENOUS

## 2021-04-25 MED ORDER — HYDRALAZINE HCL 20 MG/ML IJ SOLN: 10.0000 mg | INTRAMUSCULAR | Status: DC | PRN

## 2021-04-25 SURGICAL SUPPLY — 14 items
CATH BALLN WEDGE 5F 110CM (CATHETERS) ×1 IMPLANT
CATH INFINITI 6F ANG MULTIPACK (CATHETERS) ×1 IMPLANT
CATH INFINITI 6F FL3.5 (CATHETERS) ×1 IMPLANT
DEVICE RAD COMP TR BAND LRG (VASCULAR PRODUCTS) ×1 IMPLANT
GLIDESHEATH SLEND SS 6F .021 (SHEATH) ×1 IMPLANT
GUIDEWIRE INQWIRE 1.5J.035X260 (WIRE) IMPLANT
INQWIRE 1.5J .035X260CM (WIRE) ×2
KIT HEART LEFT (KITS) ×2 IMPLANT
PACK CARDIAC CATHETERIZATION (CUSTOM PROCEDURE TRAY) ×2 IMPLANT
SHEATH GLIDE SLENDER 4/5FR (SHEATH) ×1 IMPLANT
SYR MEDRAD MARK 7 150ML (SYRINGE) ×2 IMPLANT
TRANSDUCER W/STOPCOCK (MISCELLANEOUS) ×2 IMPLANT
TUBING CIL FLEX 10 FLL-RA (TUBING) ×2 IMPLANT
WIRE EMERALD 3MM-J .025X260CM (WIRE) ×1 IMPLANT

## 2021-04-25 NOTE — Interval H&P Note (Signed)
History and Physical Interval Note: ? ?04/25/2021 ?7:04 AM ? ?Chesley MiresSusan Westlake Venkatesh  has presented today for surgery, with the diagnosis of aortic stenosis.  The various methods of treatment have been discussed with the patient and family. After consideration of risks, benefits and other options for treatment, the patient has consented to  Procedure(s): ?RIGHT/LEFT HEART CATH AND CORONARY ANGIOGRAPHY (N/A) as a surgical intervention.  The patient's history has been reviewed, patient examined, no change in status, stable for surgery.  I have reviewed the patient's chart and labs.  Questions were answered to the patient's satisfaction.   ? ?Cath Lab Visit (complete for each Cath Lab visit) ? ?Clinical Evaluation Leading to the Procedure:  ? ?ACS: No. ? ?Non-ACS:   ? ?Anginal Classification: CCS I ? ?Anti-ischemic medical therapy: Minimal Therapy (1 class of medications) ? ?Non-Invasive Test Results: No non-invasive testing performed ? ?Prior CABG: No previous CABG ? ? ? ? ? ? ? ?Orbie PyoArun K Tarrell Debes ? ? ?

## 2021-04-25 NOTE — H&P (Signed)
Cardiology Admission History and Physical:   Patient ID: Regina Tate MRN: [id number]; DOB: 05/02/1945   Admission date: 04/25/2021  PCP:  Maurice Small, MD   [facility] Providers Cardiologist:  Meriam Sprague, MD        Chief Complaint:  Dyspnea   History of Present Illness:   FOCUSED CARDIOVASCULAR PROBLEM LIST:   1.  Moderate aortic stenosis and aortic valve area 0.83 cm grade, mean gradient 26 mmHg, peak velocity of 3.3 m/s and dimensionless index of 0.33 with normal ejection fraction; EKG demonstrates no bundle-branch blocks 2.  Sjogren's syndrome 3.  Scleroderma 4.  Raynaud's syndrome 5.  Rheumatoid arthritis on Enbrel, methotrexate, and plaquenil     HISTORY OF PRESENT ILLNESS: The patient is a 76 y.o. female with the indicated medical history here for recommendations regarding dyspnea in the context of aortic valvular disease that is moderate.  Patient had seen Dr. Shari Tate recently.  She reports decreased exercise capacity, fatigue, and shortness of breath.  She used to be able to exercise quite vigorously.  She would do water aerobics and Silver sneakers at home.  Apparently over the last few months she has become more debilitated.  She endorses rather progressive shortness of breath and fatigue.  She occasionally gets lightheaded when she goes from sitting to standing quickly but has had no frank syncope.  She has required no hospitalizations or emergency room visits.  She has had no palpitations paroxysmal nocturnal dyspnea or orthopnea per se but she does have trouble sleeping but is unable to tell me exactly the reason for this.   She lives with a roommate that she is known for 30 years.  She has good social support.  She wants to be more active but her symptoms have kept her from this.  She has her teeth cleaned 3 times a year due to Sjogren's and reports no issues with her dental health.  She has had no recent flares of her rheumatologic diseases.   She has had no fevers or chills or any signs of infection recently.   Past Medical History:  Diagnosis Date   Allergic rhinitis    CREST syndrome (HCC)    Deep vein thrombosis (DVT) (HCC)    GERD (gastroesophageal reflux disease)    Hypercholesteremia    Hyperlipidemia    Osteopenia    RA (rheumatoid arthritis) (HCC)    Raynaud's syndrome without gangrene    Scleroderma (HCC)    Sjogren's syndrome with keratoconjunctivitis sicca (HCC)    Vitamin D deficiency     No past surgical history on file.   Medications Prior to Admission: Prior to Admission medications   Medication Sig Start Date End Date Taking? Authorizing Provider  amLODipine (NORVASC) 5 MG tablet Take 5 mg by mouth daily. 11/12/16  Yes [provider]  atorvastatin (LIPITOR) 20 MG tablet Take 1 tablet (20 mg total) by mouth daily. 12/25/20 04/25/21 Yes Meriam Sprague, MD  Biotin 5 MG CAPS Take 5 mg by mouth daily.   Yes [provider]  Bromelains 500 MG TABS Take 500 mg by mouth daily.   Yes [provider]  CALCIUM PO Take 1,200 mg by mouth daily. Gummies   Yes [provider]  Cholecalciferol (VITAMIN D) 2000 units CAPS Take 2,000 Units by mouth daily.   Yes [provider]  ENBREL SURECLICK 50 MG/ML injection Inject 50 mg into the skin once a week. 09/24/16  Yes [provider]  famotidine (PEPCID) 40 MG  tablet Take 40 mg by mouth 3 (three) times a week.   Yes [provider]  Flaxseed, Linseed, (FLAX SEED OIL) 1000 MG CAPS Take 1,000 mg by mouth daily.   Yes [provider]  fluticasone (FLONASE) 50 MCG/ACT nasal spray Place 1 spray into both nostrils daily.   Yes [provider]  folic acid (FOLVITE) 1 MG tablet Take 1 mg by mouth daily. 11/11/16  Yes [provider]  hydroxychloroquine (PLAQUENIL) 200 MG tablet Take 200 mg by mouth 2 (two) times daily. 11/11/16  Yes [provider]  methotrexate (RHEUMATREX) 2.5 MG  tablet Take 10 mg by mouth 2 (two) times a week. Thursday and Friday 11/13/16  Yes [provider]  Multiple Vitamin (MULTIVITAMIN) tablet Take 1 tablet by mouth daily.   Yes [provider]  Omega-3 Fatty Acids (FISH OIL) 1000 MG CAPS Take 2,000 mg by mouth daily.   Yes [provider]  omeprazole (PRILOSEC) 40 MG capsule Take 40 mg by mouth 4 (four) times a week. 11/20/16  Yes [provider]  pilocarpine (SALAGEN) 5 MG tablet Take 5 mg by mouth 2 (two) times daily. 11/12/16  Yes [provider]  Polyethyl Glycol-Propyl Glycol (SYSTANE) 0.4-0.3 % SOLN Place 1 drop into both eyes daily as needed (lubricant).   Yes [provider]  Probiotic Product (PROBIOTIC PO) Take 1 tablet by mouth daily.   Yes [provider]  Turmeric 450 MG CAPS Take 450 mg by mouth 2 (two) times daily.   Yes [provider]  metoprolol tartrate (LOPRESSOR) 25 MG tablet Take 1 tablet (25 mg total) by mouth once for 1 dose. Patient not taking: Reported on 04/18/2021 03/22/21 03/22/21  Filbert Schilder, NP     Allergies:    Allergies  Allergen Reactions   Codeine     Other reaction(s): makes pt the next day feel like she was drugged   Molds & Smuts     Itching eyes, congestion, headache   Pollen Extract     Itching eyes, congestion, headache    Social History:   Social History   Socioeconomic History   Marital status: Divorced    Spouse name: Not on file   Number of children: Not on file   Years of education: Not on file   Highest education level: Not on file  Occupational History   Not on file  Tobacco Use   Smoking status: Never   Smokeless tobacco: Never  Substance and Sexual Activity   Alcohol use: Not on file   Drug use: Not on file   Sexual activity: Not on file  Other Topics Concern   Not on file  Social History Narrative   Not on file   Social Determinants of Health   Financial Resource Strain: Not on file  Food  Insecurity: Not on file  Transportation Needs: Not on file  Physical Activity: Not on file  Stress: Not on file  Social Connections: Not on file  Intimate Partner Violence: Not on file    Family History:   The patient's family history is negative for Breast cancer.    ROS:  Please see the history of present illness.  All other ROS reviewed and negative.     Physical Exam/Data:   Vitals:   04/25/21 0620  BP: (!) 145/71  Pulse: 76  Resp: 16  Temp: 98 F (36.7 C)  TempSrc: Oral  SpO2: 100%  Weight: 66.2 kg  Height: 5' 4.5" (1.638 m)  No intake or output data in the 24 hours ending 04/25/21 0709 Last 3 Weights 04/25/2021 04/19/2021 03/21/2021  Weight (lbs) 146 lb 146 lb 148 lb 3.2 oz  Weight (kg) 66.225 kg 66.225 kg 67.223 kg     Body mass index is 24.67 kg/m.  GEN:  AO x 3 in no acute distress HEENT: normal Dentition: Normal Neck: JVP normal. +2 carotid upstrokes without bruits. No thyromegaly. Lungs: equal expansion, clear bilaterally CV: Apex is discrete and nondisplaced, RRR with 3/6 SEM  Abd: soft, non-tender, non-distended; no bruit; positive bowel sounds Ext: no edema, ecchymoses, or cyanosis Vascular: 2+ femoral pulses, 2+ radial pulses       Skin: warm and dry without rash Neuro: CN II-XII grossly intact; motor and sensory grossly intact   DATA AND STUDIES:   EKG: Sinus rhythm without bundle-branch blocks   2D ECHO: 12/22  1. Left ventricular ejection fraction, by estimation, is 60 to 65%. Left  ventricular ejection fraction by 3D volume is 60 %. The left ventricle has  normal function. The left ventricle has no regional wall motion  abnormalities. There is mild concentric  left ventricular hypertrophy. Left ventricular diastolic parameters are  consistent with Grade I diastolic dysfunction (impaired relaxation).   2. Right ventricular systolic function is normal. The right ventricular  size is normal.   3. The mitral valve is normal in structure.  Trivial mitral valve  regurgitation. No evidence of mitral stenosis.   4. The aortic valve is calcified. There is severe calcifcation of the  aortic valve. There is severe thickening of the aortic valve. Aortic valve  regurgitation is mild. Moderate aortic valve stenosis. AVA 0.83cm2 by  continuity, mean gradient , Vmax   3.3 m/s, DI 0.33   5. Aortic dilatation noted. There is mild dilatation of the ascending  aorta, measuring 36 mm.   6. The inferior vena cava is normal in size with greater than 50%  respiratory variability, suggesting right atrial pressure of 3 mmHg.   7. Left atrial size was mildly dilated.    CARDIAC CATH: Pending       Laboratory Data:  High Sensitivity Troponin:  No results for input(s): TROPONINIHS in the last 720 hours.    Chemistry Recent Labs  Lab 04/19/21 1427  NA 140  K 5.0  CL 101  CO2 22  GLUCOSE 93  BUN 20  CREATININE 0.78  CALCIUM 9.2    Recent Labs  Lab 04/19/21 1427  PROT 6.0  ALBUMIN 4.3  AST 29  ALT 23  ALKPHOS 70  BILITOT 0.2   Lipids No results for input(s): CHOL, TRIG, HDL, LABVLDL, LDLCALC, CHOLHDL in the last 168 hours. Hematology Recent Labs  Lab 04/19/21 1427  WBC 7.6  RBC 4.06  HGB 12.2  HCT 37.1  MCV 91.4  MCH 30.0  MCHC 32.9  RDW 16.1*  PLT 217   Thyroid No results for input(s): TSH, FREET4 in the last 168 hours. BNP Recent Labs  Lab 04/19/21 1427  PROBNP 193    DDimer No results for input(s): DDIMER in the last 168 hours.   Radiology/Studies:  No results found.   Assessment and Plan:   Dyspnea and exertional angina:  Will proceed to coronary angiography and RHC for PROGRESS trial purposes.  Further recommendations will follow the results of this study. Rheumatologic diseases:  Cont current medical therapy.      Signed, Orbie Pyo, MD  04/25/2021 7:09 AM

## 2021-04-25 NOTE — Interval H&P Note (Signed)
History and Physical Interval Note: ? ?04/25/2021 ?7:04 AM ? ?Regina MiresSusan Westlake Ludwig  has presented today for surgery, with the diagnosis of aortic stenosis.  The various methods of treatment have been discussed with the patient and family. After consideration of risks, benefits and other options for treatment, the patient has consented to  Procedure(s): ?RIGHT/LEFT HEART CATH AND CORONARY ANGIOGRAPHY (N/A) as a surgical intervention.  The patient's history has been reviewed, patient examined, no change in status, stable for surgery.  I have reviewed the patient's chart and labs.  Questions were answered to the patient's satisfaction.   ? ? ?Orbie PyoArun K Danamarie Minami ? ? ?

## 2021-04-25 NOTE — Research (Cosign Needed Addendum)
Progress Trial ? ?NIH Stroke scale and Modified Rankin Scale performed ? ?mRS: 2 ?NIH: 0 ? ?See worksheet for details. ? ?

## 2021-04-27 DIAGNOSIS — Z006 Encounter for examination for normal comparison and control in clinical research program: Secondary | ICD-10-CM

## 2021-04-27 NOTE — Research (Signed)
? ? ?  ?  Progress Randomization ?  ?            Treatment assignment:   Clincal surveillance ?            Assignment date and time:   27 Apr 2021  ?

## 2021-05-01 ENCOUNTER — Other Ambulatory Visit: Payer: Self-pay

## 2021-05-01 DIAGNOSIS — I350 Nonrheumatic aortic (valve) stenosis: Secondary | ICD-10-CM

## 2021-06-04 NOTE — Progress Notes (Deleted)
?Cardiology Office Note:   ? ?Date:  06/04/2021  ? ?ID:  Rosey Eide, DOB [birth year], MRN [id number] ? ?PCP:  Maurice Small, MD  ?[facility] Cardiologist:  Meriam Sprague, MD  ?[facility] Electrophysiologist:  None  ? ?Referring MD: Maurice Small, MD  ? ?History of Present Illness:   ? ?Imunique Samad is a 76 y.o. female with a histiry of RA, scleroderma, and moderate aortic stenosis who was initially referred for moderate AS who now returns to clinic for follow-up. ? ?Patient initially saw Dr. [name] in 2018 for possible enrollment into the pulmonary HTN screening program. At that time, she denied any history of cardiac problems. She was active with walking and yoga with no exertional symptoms. Has a very strong family history of CAD. TTE and PFTs at that time without evidence of PAH or pulmonary fibrosis but noted mild aortic stenosis with peak gradient . She was recommended for yearly follow-up.  ? ?She underwent TTE on 11/30/19 which showed interval progression in AS with severe valvular thickening and calcification, moderate AS with mean gradient and AVA 1.4cm2. LVEF remained normal at 60-65%. Ca score 119. ? ?When she saw me in clinic on 11/2019, she was doing well without exertional or HF symptoms. She saw Edd Fabian on 01/22/21 where she continued to feel well. TTE was ordered for serial monitoring of AS.  ? ?TTE on 02/02/21 showed EF 60-65%, G1DD, trivial MR, mild AR, moderate AS with AVA 0.83cm, mean gradient , Vmax 3.3, DI 0.33. She called clinic on 03/14/21 with symptoms of decreased exercise capacity, fatigue and SOB. She now presents to clinic for further evaluation. ? ?Was last seen in clinic on 02/2021 where she was feeling more fatigue and dyspnea with exercise. We referred her to Dr. Lynnette Caffey for enrollment into the Progress Trial for moderate AS. Cath showed mild CAD, normal filling pressures. She is currently enrolled in the control  group ? ?Family history: Sister had CVA 16. Father died MI 6, Aunt 68 CVA, Grandfather died 25 with MI, Mother died at 72 with CHF.  ? ?Past Medical History:  ?Diagnosis Date  ? Allergic rhinitis   ? CREST syndrome (HCC)   ? Deep vein thrombosis (DVT) (HCC)   ? GERD (gastroesophageal reflux disease)   ? Hypercholesteremia   ? Hyperlipidemia   ? Osteopenia   ? RA (rheumatoid arthritis) (HCC)   ? Raynaud's syndrome without gangrene   ? Scleroderma (HCC)   ? Sjogren's syndrome with keratoconjunctivitis sicca (HCC)   ? Vitamin D deficiency   ? ? ?Past Surgical History:  ?Procedure Laterality Date  ? RIGHT/LEFT HEART CATH AND CORONARY ANGIOGRAPHY N/A 04/25/2021  ? Procedure: RIGHT/LEFT HEART CATH AND CORONARY ANGIOGRAPHY;  Surgeon: Orbie Pyo, MD;  Location: [facility] INV[facility];  Service: Cardiovascular;  Laterality: N/A;  ? ? ?Current Medications: ?No outpatient medications have been marked as taking for the 06/12/21 encounter (Appointment) with Meriam Sprague, MD.  ?  ? ?Allergies:   Codeine, Molds & smuts, and Pollen extract  ? ?Social History  ? ?Socioeconomic History  ? Marital status: Divorced  ?  Spouse name: Not on file  ? Number of children: Not on file  ? Years of education: Not on file  ? Highest education level: Not on file  ?Occupational History  ? Not on file  ?Tobacco Use  ? Smoking status: Never  ? Smokeless tobacco: Never  ?Substance and Sexual Activity  ? Alcohol use: Not on file  ?  Drug use: Not on file  ? Sexual activity: Not on file  ?Other Topics Concern  ? Not on file  ?Social History Narrative  ? Not on file  ? ?Social Determinants of Health  ? ?Financial Resource Strain: Not on file  ?Food Insecurity: Not on file  ?Transportation Needs: Not on file  ?Physical Activity: Not on file  ?Stress: Not on file  ?Social Connections: Not on file  ?  ? ?Family History: ?Sister had CVA 6140. Father died MI 3258, Aunt 960 CVA, Grandfather died 2458 with MI, Mother died at 5480 with CHF.  ? ?ROS:   ?Please see  the history of present illness.    ?Review of Systems  ?Constitutional:  Positive for malaise/fatigue. Negative for chills and fever.  ?HENT:  Positive for sore throat (Tonsil stones). Negative for hearing loss.   ?Eyes:  Negative for blurred vision.  ?Respiratory:  Negative for cough.   ?Cardiovascular:  Negative for chest pain, palpitations, orthopnea, claudication, leg swelling and PND.  ?Gastrointestinal:  Negative for nausea and vomiting.  ?Genitourinary:  Negative for dysuria.  ?Musculoskeletal:  Negative for joint pain and myalgias.  ?Skin:  Negative for rash.  ?Neurological:  Positive for dizziness. Negative for loss of consciousness.  ?Endo/Heme/Allergies:  Does not bruise/bleed easily.  ?Psychiatric/Behavioral:  Negative for depression.   ? ?EKGs/Labs/Other Studies Reviewed:   ? ?The following studies were reviewed today: ?RHC/LHC 03/23: ?LV end diastolic pressure is normal. ?  The left ventricular ejection fraction is 55-65% by visual estimate. ?  ?1.  Mild obstructive coronary artery disease ?2.  Cardiac output of 8.6 L/min and cardiac index of 5.0 L/min/m?. ?3.  RA pressure of 8/4 with a mean of 3 mmHg, wedge pressure of 8/6 with a mean of 4 mmHg, pulmonary artery pressure 24/7, and papi of greater than 5. ? ?TTE 02/02/21: ?IMPRESSIONS  ? 1. Left ventricular ejection fraction, by estimation, is 60 to 65%. Left ventricular ejection fraction by 3D volume is 60 %. The left ventricle has normal function. The left ventricle has no regional wall motion abnormalities. There is mild concentric left ventricular hypertrophy. Left ventricular diastolic parameters are consistent with Grade I diastolic dysfunction (impaired relaxation).  ? 2. Right ventricular systolic function is normal. The right ventricular size is normal.  ? 3. The mitral valve is normal in structure. Trivial mitral valve regurgitation. No evidence of mitral stenosis.  ? 4. The aortic valve is calcified. There is severe calcifcation of the  aortic valve. There is severe thickening of the aortic valve. Aortic valve regurgitation is mild. Moderate aortic valve stenosis. AVA 0.83cm2 by continuity, mean gradient 26mmHg, Vmax  3.3 m/s, DI 0.33  ? 5. Aortic dilatation noted. There is mild dilatation of the ascending aorta, measuring 36 mm.  ? 6. The inferior vena cava is normal in size with greater than 50% respiratory variability, suggesting right atrial pressure of 3 mmHg.  ? 7. Left atrial size was mildly dilated.  ? ?Comparison(s): Prior TTE in 11/2019 showed EF 60%, GLS -22.6%, AS mean gradient 15 mmHg, peak gradient 23.817mmHg, mild AI. Compared to prior, the mean AoV gradient has increased.  ? ?TTE 11/30/19: ?IMPRESSIONS  ? 1. Left ventricular ejection fraction, by estimation, is 60 to 65%. The left ventricle has normal function. The left ventricle has no regional wall motion abnormalities. Left ventricular diastolic parameters are consistent with Grade I diastolic dysfunction (impaired relaxation). Elevated left atrial pressure. The average left ventricular global longitudinal strain is 22.6 %. The  global longitudinal strain is normal.  ? 2. Right ventricular systolic function is normal. The right ventricular size is normal.  ? 3. Left atrial size was mildly dilated.  ? 4. The mitral valve is normal in structure. Trivial mitral valve regurgitation. No evidence of mitral stenosis.  ? 5. Tricuspid valve regurgitation is mild to moderate.  ? 6. The aortic valve is normal in structure. There is severe calcifcation of the aortic valve. There is severe thickening of the aortic valve. Aortic valve regurgitation is mild. Moderate aortic valve stenosis. Aortic valve mean gradient measures 15.0 mmHg.  ? 7. The inferior vena cava is normal in size with greater than 50% respiratory variability, suggesting right atrial pressure of 3 mmHg.  ? ?TTE 12/10/16: ?Study Conclusions  ? ?- Left ventricle: The cavity size was normal. Wall thickness was normal. Systolic  function was normal. The estimated ejection fraction was in the range of 55% to 60%. Wall motion was normal;  ?  there were no regional wall motion abnormalities. Doppler  ?  parameters are consistent with abnor

## 2021-06-12 ENCOUNTER — Encounter: Payer: Self-pay | Admitting: Cardiology

## 2021-06-12 ENCOUNTER — Ambulatory Visit (INDEPENDENT_AMBULATORY_CARE_PROVIDER_SITE_OTHER): Payer: Medicare Other | Admitting: Cardiology

## 2021-06-12 ENCOUNTER — Ambulatory Visit (INDEPENDENT_AMBULATORY_CARE_PROVIDER_SITE_OTHER): Payer: Medicare Other

## 2021-06-12 ENCOUNTER — Other Ambulatory Visit: Payer: Self-pay | Admitting: Cardiology

## 2021-06-12 VITALS — BP 130/74 | HR 84 | Ht 64.5 in | Wt 148.0 lb

## 2021-06-12 DIAGNOSIS — R0609 Other forms of dyspnea: Secondary | ICD-10-CM

## 2021-06-12 DIAGNOSIS — G459 Transient cerebral ischemic attack, unspecified: Secondary | ICD-10-CM

## 2021-06-12 DIAGNOSIS — E785 Hyperlipidemia, unspecified: Secondary | ICD-10-CM

## 2021-06-12 DIAGNOSIS — I351 Nonrheumatic aortic (valve) insufficiency: Secondary | ICD-10-CM | POA: Diagnosis not present

## 2021-06-12 DIAGNOSIS — I350 Nonrheumatic aortic (valve) stenosis: Secondary | ICD-10-CM

## 2021-06-12 DIAGNOSIS — R42 Dizziness and giddiness: Secondary | ICD-10-CM

## 2021-06-12 DIAGNOSIS — M349 Systemic sclerosis, unspecified: Secondary | ICD-10-CM

## 2021-06-12 DIAGNOSIS — I951 Orthostatic hypotension: Secondary | ICD-10-CM

## 2021-06-12 DIAGNOSIS — M3500 Sicca syndrome, unspecified: Secondary | ICD-10-CM

## 2021-06-12 MED ORDER — ASPIRIN EC 81 MG PO TBEC: 81.0000 mg | DELAYED_RELEASE_TABLET | Freq: Every day | ORAL | 3 refills | Status: AC

## 2021-06-12 NOTE — Patient Instructions (Signed)
Medication Instructions:  ? ?START TAKING ASPIRIN 81 MG BY MOUTH DAILY ? ?*If you need a refill on your cardiac medications before your next appointment, please call your pharmacy* ? ? ?You have been referred to [place]  ? ? ?Testing/Procedures: ? ?Your physician has requested that you have a carotid duplex. This test is an ultrasound of the carotid arteries in your neck. It looks at blood flow through these arteries that supply the brain with blood. Allow one hour for this exam. There are no restrictions or special instructions. ? ? ?ZIO XT- Long Term Monitor Instructions ? ?Your physician has requested you wear a ZIO patch monitor for  14 days.  ?This is a single patch monitor. [employer] supplies one patch monitor per enrollment. Additional ?stickers are not available. Please do not apply patch if you will be having a Nuclear Stress Test,  ?Echocardiogram, Cardiac CT, MRI, or Chest Xray during the period you would be wearing the  ?monitor. The patch cannot be worn during these tests. You cannot remove and re-apply the  ?ZIO XT patch monitor.  ?Your ZIO patch monitor will be mailed 3 day USPS to your address on file. It may take 3-5 days  ?to receive your monitor after you have been enrolled.  ?Once you have received your monitor, please review the enclosed instructions. Your monitor  ?has already been registered assigning a specific monitor serial # to you. ? ?Billing and Patient Assistance Program Information ? ?We have supplied [employer] with any of your insurance information on file for billing purposes. ?[employer] offers a sliding scale Patient Assistance Program for patients that do not have  ?insurance, or whose insurance does not completely cover the cost of the ZIO monitor.  ?You must apply for the Patient Assistance Program to qualify for this discounted rate.  ?To apply, please call [employer] at [phone], select option 4, select option 2, ask to apply for  ?Patient Assistance Program. Theodore Demark will  ask your household income, and how many people  ?are in your household. They will quote your out-of-pocket cost based on that information.  ?[employer] will also be able to set up a 77-month, interest-free payment plan if needed. ? ?Applying the monitor ?  ?Shave hair from upper left chest.  ?Hold abrader disc by orange tab. Rub abrader in 40 strokes over the upper left chest as  ?indicated in your monitor instructions.  ?Clean area with 4 enclosed alcohol pads. Let dry.  ?Apply patch as indicated in monitor instructions. Patch will be placed under collarbone on left  ?side of chest with arrow pointing upward.  ?Rub patch adhesive wings for 2 minutes. Remove white label marked "1". Remove the white  ?label marked "2". Rub patch adhesive wings for 2 additional minutes.  ?While looking in a mirror, press and release button in center of patch. A small green light will  ?flash 3-4 times. This will be your only indicator that the monitor has been turned on.  ?Do not shower for the first 24 hours. You may shower after the first 24 hours.  ?Press the button if you feel a symptom. You will hear a small click. Record Date, Time and  ?Symptom in the Patient Logbook.  ?When you are ready to remove the patch, follow instructions on the last 2 pages of Patient  ?Logbook. Stick patch monitor onto the last page of Patient Logbook.  ?Place Patient Logbook in the blue and white box. Use locking tab on box and tape box closed  ?  securely. The blue and white box has prepaid postage on it. Please place it in the mailbox as  ?soon as possible. Your physician should have your test results approximately 7 days after the  ?monitor has been mailed back to [facility].  ?Call [facility] at [phone] if you have questions regarding  ?your ZIO XT patch monitor. Call them immediately if you see an orange light blinking on your  ?monitor.  ?If your monitor falls off in less than 4 days, contact our Monitor department at  [phone].  ?If your monitor becomes loose or falls off after 4 days call [employer] at [phone] for  ?suggestions on securing your monitor ? ? ?Follow-Up: ?At [facility], you and your health needs are our priority.  As part of our continuing mission to provide you with exceptional heart care, we have created designated Provider Care Teams.  These Care Teams include your primary Cardiologist (physician) and Advanced Practice Providers (APPs -  Physician Assistants and Nurse Practitioners) who all work together to provide you with the care you need, when you need it. ? ?We recommend signing up for the patient portal called "MyChart".  Sign up information is provided on this After Visit Summary.  MyChart is used to connect with patients for Virtual Visits (Telemedicine).  Patients are able to view lab/test results, encounter notes, upcoming appointments, etc.  Non-urgent messages can be sent to your provider as well.   ?To learn more about what you can do with MyChart, go to [email or web address].   ? ?Your next appointment:   ?6 month(s) ? ?The format for your next appointment:   ?In Person ? ?Provider:   ?Freada Bergeron, MD { ? ? ? ?Important Information About Sugar ? ? ? ? ? ? ?

## 2021-06-12 NOTE — Progress Notes (Signed)
?Cardiology Office Note:   ? ?Date:  06/12/2021  ? ?ID:  Regina Tate, DOB 1946-01-17, MRN [id number] ? ?PCP:  Maurice Small, MD  ?[facility] Cardiologist:  Meriam Sprague, MD  ?[facility] Electrophysiologist:  None  ? ?Referring MD: Maurice Small, MD  ? ?History of Present Illness:   ? ?Regina Tate is a 76 y.o. female with a histiry of RA, scleroderma, and moderate aortic stenosis who was initially referred for moderate AS who now returns to clinic for follow-up. ? ?Patient initially saw Dr. [name] in 2018 for possible enrollment into the pulmonary HTN screening program. At that time, she denied any history of cardiac problems. She was active with walking and yoga with no exertional symptoms. Has a very strong family history of CAD. TTE and PFTs at that time without evidence of PAH or pulmonary fibrosis but noted mild aortic stenosis with peak gradient . She was recommended for yearly follow-up.  ? ?She underwent TTE on 11/30/19 which showed interval progression in AS with severe valvular thickening and calcification, moderate AS with mean gradient and AVA 1.4cm2. LVEF remained normal at 60-65%. Ca score 119. ? ?When she saw me in clinic on 11/2019, she was doing well without exertional or HF symptoms. She saw Edd Fabian on 01/22/21 where she continued to feel well. TTE was ordered for serial monitoring of AS.  ? ?TTE on 02/02/21 showed EF 60-65%, G1DD, trivial MR, mild AR, moderate AS with AVA 0.83cm, mean gradient , Vmax 3.3, DI 0.33. She called clinic on 03/14/21 with symptoms of decreased exercise capacity, fatigue and SOB. She now presents to clinic for further evaluation. ? ?Was last seen in clinic on 02/2021 where she was feeling more fatigue and dyspnea with exercise. We referred her to Dr. Lynnette Caffey for enrollment into the Progress Trial for moderate AS. Cath showed mild CAD, normal filling pressures. She is currently enrolled in the control  group ? ?Today, she is doing alright. However, she continues to become short of breath with exertion. She is still able to follow 15 minute [employer] exercise classes and water aerobics. ? ?She has episodes of dizziness up to 3 times weekly. The episodes are brief. When she feels an episode start, she will sit or lay down until the episode passes. She is workign on trying to hydrate more. No syncope. ? ?She had an episode where she had a transient loss of vision where " a curtain came over her eyes. " This episode lasted about 10 seconds before resolving. She denies palpitations and her Smart Watch has never notified her of arrhythmias. She denies chest pain, chest pressure, syncope, palpitations, PND, orthopnea, or leg swelling.  ? ?Family history: Sister had CVA 73. Father died MI 4, Aunt 47 CVA, Grandfather died 31 with MI, Mother died at 47 with CHF.  ? ?Past Medical History:  ?Diagnosis Date  ? Allergic rhinitis   ? CREST syndrome (HCC)   ? Deep vein thrombosis (DVT) (HCC)   ? GERD (gastroesophageal reflux disease)   ? Hypercholesteremia   ? Hyperlipidemia   ? Osteopenia   ? RA (rheumatoid arthritis) (HCC)   ? Raynaud's syndrome without gangrene   ? Scleroderma (HCC)   ? Sjogren's syndrome with keratoconjunctivitis sicca (HCC)   ? Vitamin D deficiency   ? ? ?Past Surgical History:  ?Procedure Laterality Date  ? RIGHT/LEFT HEART CATH AND CORONARY ANGIOGRAPHY N/A 04/25/2021  ? Procedure: RIGHT/LEFT HEART CATH AND CORONARY ANGIOGRAPHY;  Surgeon: Orbie Pyo,  MD;  Location: [facility];  Service: Cardiovascular;  Laterality: N/A;  ? ? ?Current Medications: ?Current Meds  ?Medication Sig  ? amLODipine (NORVASC) 5 MG tablet Take 5 mg by mouth daily.  ? aspirin EC 81 MG tablet Take 1 tablet (81 mg total) by mouth daily. Swallow whole.  ? Biotin 5 MG CAPS Take 5 mg by mouth daily.  ? Bromelains 500 MG TABS Take 500 mg by mouth daily.  ? CALCIUM PO Take 1,200 mg by mouth daily. Gummies  ? Cholecalciferol  (VITAMIN D) 2000 units CAPS Take 2,000 Units by mouth daily.  ? ENBREL SURECLICK 50 MG/ML injection Inject 50 mg into the skin once a week.  ? famotidine (PEPCID) 40 MG tablet Take 40 mg by mouth 3 (three) times a week.  ? Flaxseed, Linseed, (FLAX SEED OIL) 1000 MG CAPS Take 1,000 mg by mouth daily.  ? fluticasone (FLONASE) 50 MCG/ACT nasal spray Place 1 spray into both nostrils daily.  ? folic acid (FOLVITE) 1 MG tablet Take 1 mg by mouth daily.  ? hydroxychloroquine (PLAQUENIL) 200 MG tablet Take 200 mg by mouth 2 (two) times daily.  ? methotrexate (RHEUMATREX) 2.5 MG tablet Take 10 mg by mouth 2 (two) times a week. Thursday and Friday  ? Multiple Vitamin (MULTIVITAMIN) tablet Take 1 tablet by mouth daily.  ? Omega-3 Fatty Acids (FISH OIL) 1000 MG CAPS Take 2,000 mg by mouth daily.  ? omeprazole (PRILOSEC) 40 MG capsule Take 40 mg by mouth 4 (four) times a week.  ? pilocarpine (SALAGEN) 5 MG tablet Take 5 mg by mouth 2 (two) times daily.  ? Polyethyl Glycol-Propyl Glycol (SYSTANE) 0.4-0.3 % SOLN Place 1 drop into both eyes daily as needed (lubricant).  ? Probiotic Product (PROBIOTIC PO) Take 1 tablet by mouth daily.  ? Turmeric 450 MG CAPS Take 450 mg by mouth 2 (two) times daily.  ?  ? ?Allergies:   Codeine, Molds & smuts, and Pollen extract  ? ?Social History  ? ?Socioeconomic History  ? Marital status: Divorced  ?  Spouse name: Not on file  ? Number of children: Not on file  ? Years of education: Not on file  ? Highest education level: Not on file  ?Occupational History  ? Not on file  ?Tobacco Use  ? Smoking status: Never  ? Smokeless tobacco: Never  ?Substance and Sexual Activity  ? Alcohol use: Not on file  ? Drug use: Not on file  ? Sexual activity: Not on file  ?Other Topics Concern  ? Not on file  ?Social History Narrative  ? Not on file  ? ?Social Determinants of Health  ? ?Financial Resource Strain: Not on file  ?Food Insecurity: Not on file  ?Transportation Needs: Not on file  ?Physical Activity: Not  on file  ?Stress: Not on file  ?Social Connections: Not on file  ?  ? ?Family History: ?Sister had CVA 25. Father died MI 36, Aunt 78 CVA, Grandfather died 75 with MI, Mother died at 19 with CHF.  ? ?ROS:   ?Please see the history of present illness.    ?Review of Systems  ?Constitutional:  Negative for chills, fever and malaise/fatigue.  ?HENT:  Negative for hearing loss and sore throat.   ?Eyes:  Positive for blurred vision (bilateral eyes).  ?Respiratory:  Positive for shortness of breath (exertional). Negative for cough.   ?Cardiovascular:  Negative for chest pain, palpitations, orthopnea, claudication, leg swelling and PND.  ?Gastrointestinal:  Negative for  nausea and vomiting.  ?Genitourinary:  Negative for dysuria.  ?Musculoskeletal:  Positive for myalgias ("body stiffening"). Negative for joint pain.  ?Skin:  Negative for rash.  ?Neurological:  Positive for dizziness. Negative for loss of consciousness.  ?Endo/Heme/Allergies:  Does not bruise/bleed easily.  ?Psychiatric/Behavioral:  Negative for depression.   ? ?EKGs/Labs/Other Studies Reviewed:   ? ?The following studies were reviewed today: ?RHC/LHC 03/23: ?LV end diastolic pressure is normal. ?  The left ventricular ejection fraction is 55-65% by visual estimate. ?  ?1.  Mild obstructive coronary artery disease ?2.  Cardiac output of 8.6 L/min and cardiac index of 5.0 L/min/m?. ?3.  RA pressure of 8/4 with a mean of 3 mmHg, wedge pressure of 8/6 with a mean of 4 mmHg, pulmonary artery pressure 24/7, and papi of greater than 5. ? ?CT Coronary 04/10/21 ?IMPRESSION: ?1. Tricuspid aortic valve with bulky calcification of the NCC. ?Aortic Valve Calcium score: 931 ?  ?2. Annular measurements appropriate for 23 mm S3 TAVR (391 mm2). ?  ?3. No significant annular or subannular calcifications. ?  ?4. Sufficient coronary to annulus distance. ?  ?5. Optimal Fluoroscopic Angle for Delivery: LAO 11 CAU 25 ? ?TTE 02/02/21: ?IMPRESSIONS  ? 1. Left ventricular ejection  fraction, by estimation, is 60 to 65%. Left ventricular ejection fraction by 3D volume is 60 %. The left ventricle has normal function. The left ventricle has no regional wall motion abnormalities. There

## 2021-06-12 NOTE — Progress Notes (Unsigned)
Enrolled for [employer] to mail a ZIO XT long term holter monitor to the patients address on file.  

## 2021-06-15 ENCOUNTER — Telehealth: Payer: Self-pay | Admitting: Cardiology

## 2021-06-15 DIAGNOSIS — R42 Dizziness and giddiness: Secondary | ICD-10-CM

## 2021-06-15 DIAGNOSIS — G459 Transient cerebral ischemic attack, unspecified: Secondary | ICD-10-CM | POA: Diagnosis not present

## 2021-06-15 NOTE — Telephone Encounter (Signed)
Patient states she received the heart monitor, but the instructions say not to wear it during an MRI. She says she has a scan on the 3rd and wants to know if she needs to wait to put the monitor on. ?

## 2021-06-15 NOTE — Telephone Encounter (Signed)
Returned call to patient. She is going to apply today and if her monitor is still stuck on May 3rd for her MRI. She knows its ok to remove 1 day early. ?

## 2021-06-19 ENCOUNTER — Telehealth: Payer: Self-pay | Admitting: Diagnostic Neuroimaging

## 2021-06-19 ENCOUNTER — Ambulatory Visit (INDEPENDENT_AMBULATORY_CARE_PROVIDER_SITE_OTHER): Payer: Medicare Other | Admitting: Diagnostic Neuroimaging

## 2021-06-19 ENCOUNTER — Encounter: Payer: Self-pay | Admitting: Diagnostic Neuroimaging

## 2021-06-19 VITALS — BP 132/80 | HR 96 | Ht 64.5 in | Wt 148.5 lb

## 2021-06-19 DIAGNOSIS — G453 Amaurosis fugax: Secondary | ICD-10-CM

## 2021-06-19 DIAGNOSIS — G459 Transient cerebral ischemic attack, unspecified: Secondary | ICD-10-CM

## 2021-06-19 NOTE — Telephone Encounter (Signed)
[employer] medicare order sent to [facility], [facility] they will reach out to the patient to schedule.  

## 2021-06-19 NOTE — Patient Instructions (Signed)
-   check MRI brain (without), MRA head ?- follow up carotid u/s (ordered by cardiology) ?- continue aspirin 81mg , lipitor 20mg  daily ?- aortic stenosis mgmt per cardiology ?

## 2021-06-19 NOTE — Progress Notes (Signed)
? ?[facility] ? ?PATIENT: Regina Tate ?DOB: [birth year] ? ?REFERRING CLINICIAN: Meriam Sprague, MD ?HISTORY FROM: patient  ?REASON FOR VISIT: new consult  ? ? ?HISTORICAL ? ?CHIEF COMPLAINT:  ?Chief Complaint  ?Patient presents with  ? New Patient (Initial Visit)  ?  Rm 7 alone here for consult on possible TIA. Pt reports she is here to discuss a recent episode where her body stiffened and shady vision for about 10-15 seconds. Reports this event took place a few weeks ago.  ? ? ?HISTORY OF PRESENT ILLNESS:  ? ?76 year old female with possible TIA.  History of rheumatoid arthritis, scleroderma, moderate aortic stenosis. ? ?3 weeks ago patient was sitting on her bed watching TV in the afternoon when all of a sudden she felt her body stiffened up briefly.  Then she felt a curtain, over both of her eyes and transiently lost vision.  She thinks this was bilateral visual loss.  Episode lasted a total of 10 seconds.  Symptoms then fully resolved.  No numbness, weakness, headache, slurred speech, trouble talking.  ? ? ?REVIEW OF SYSTEMS: Full 14 system review of systems performed and negative with exception of: as per HPI. ? ?ALLERGIES: ?Allergies  ?Allergen Reactions  ? Codeine   ?  Other reaction(s): makes pt the next day feel like she was drugged  ? Molds & Smuts   ?  Itching eyes, congestion, headache  ? Pollen Extract   ?  Itching eyes, congestion, headache  ? ? ?HOME MEDICATIONS: ?Outpatient Medications Prior to Visit  ?Medication Sig Dispense Refill  ? amLODipine (NORVASC) 5 MG tablet Take 5 mg by mouth daily.    ? aspirin EC 81 MG tablet Take 1 tablet (81 mg total) by mouth daily. Swallow whole. 90 tablet 3  ? Biotin 5 MG CAPS Take 5 mg by mouth daily.    ? Bromelains 500 MG TABS Take 500 mg by mouth daily.    ? CALCIUM PO Take 1,200 mg by mouth daily. Gummies    ? Cholecalciferol (VITAMIN D) 2000 units CAPS Take 2,000 Units by mouth daily.    ? ENBREL SURECLICK 50 MG/ML  injection Inject 50 mg into the skin once a week.    ? famotidine (PEPCID) 40 MG tablet Take 40 mg by mouth 3 (three) times a week.    ? Flaxseed, Linseed, (FLAX SEED OIL) 1000 MG CAPS Take 1,000 mg by mouth daily.    ? fluticasone (FLONASE) 50 MCG/ACT nasal spray Place 1 spray into both nostrils daily.    ? folic acid (FOLVITE) 1 MG tablet Take 1 mg by mouth daily.    ? hydroxychloroquine (PLAQUENIL) 200 MG tablet Take 200 mg by mouth 2 (two) times daily.    ? methotrexate (RHEUMATREX) 2.5 MG tablet Take 10 mg by mouth 2 (two) times a week. Thursday and Friday    ? Multiple Vitamin (MULTIVITAMIN) tablet Take 1 tablet by mouth daily.    ? Omega-3 Fatty Acids (FISH OIL) 1000 MG CAPS Take 2,000 mg by mouth daily.    ? omeprazole (PRILOSEC) 40 MG capsule Take 40 mg by mouth 4 (four) times a week.    ? pilocarpine (SALAGEN) 5 MG tablet Take 5 mg by mouth 2 (two) times daily.    ? Polyethyl Glycol-Propyl Glycol (SYSTANE) 0.4-0.3 % SOLN Place 1 drop into both eyes daily as needed (lubricant).    ? Probiotic Product (PROBIOTIC PO) Take 1 tablet by mouth daily.    ?  Turmeric 450 MG CAPS Take 450 mg by mouth 2 (two) times daily.    ? atorvastatin (LIPITOR) 20 MG tablet Take 1 tablet (20 mg total) by mouth daily. 90 tablet 3  ? metoprolol tartrate (LOPRESSOR) 25 MG tablet Take 1 tablet (25 mg total) by mouth once for 1 dose. (Patient not taking: Reported on 04/18/2021) 1 tablet 0  ? ?No facility-administered medications prior to visit.  ? ? ?PAST MEDICAL HISTORY: ?Past Medical History:  ?Diagnosis Date  ? Allergic rhinitis   ? CREST syndrome (HCC)   ? Deep vein thrombosis (DVT) (HCC)   ? GERD (gastroesophageal reflux disease)   ? Hypercholesteremia   ? Hyperlipidemia   ? Osteopenia   ? RA (rheumatoid arthritis) (HCC)   ? Raynaud's syndrome without gangrene   ? Scleroderma (HCC)   ? Sjogren's syndrome with keratoconjunctivitis sicca (HCC)   ? Vitamin D deficiency   ? ? ?PAST SURGICAL HISTORY: ?Past Surgical History:  ?Procedure  Laterality Date  ? RIGHT/LEFT HEART CATH AND CORONARY ANGIOGRAPHY N/A 04/25/2021  ? Procedure: RIGHT/LEFT HEART CATH AND CORONARY ANGIOGRAPHY;  Surgeon: Orbie Pyo, MD;  Location: [facility];  Service: Cardiovascular;  Laterality: N/A;  ? ? ?FAMILY HISTORY: ?Family History  ?Problem Relation Age of Onset  ? Breast cancer Neg Hx   ? ? ?SOCIAL HISTORY: ?Social History  ? ?Socioeconomic History  ? Marital status: Divorced  ?  Spouse name: Not on file  ? Number of children: Not on file  ? Years of education: Not on file  ? Highest education level: Not on file  ?Occupational History  ? Not on file  ?Tobacco Use  ? Smoking status: Never  ? Smokeless tobacco: Never  ?Substance and Sexual Activity  ? Alcohol use: Not on file  ? Drug use: Not on file  ? Sexual activity: Not on file  ?Other Topics Concern  ? Not on file  ?Social History Narrative  ? Not on file  ? ?Social Determinants of Health  ? ?Financial Resource Strain: Not on file  ?Food Insecurity: Not on file  ?Transportation Needs: Not on file  ?Physical Activity: Not on file  ?Stress: Not on file  ?Social Connections: Not on file  ?Intimate Partner Violence: Not on file  ? ? ? ?PHYSICAL EXAM ? ?GENERAL EXAM/CONSTITUTIONAL: ?Vitals:  ?Vitals:  ? 06/19/21 1045  ?BP: 132/80  ?Pulse: 96  ?Weight: 148 lb 8 oz (67.4 kg)  ?Height: 5' 4.5" (1.638 m)  ? ?Body mass index is 25.1 kg/m?. ?Wt Readings from Last 3 Encounters:  ?06/19/21 148 lb 8 oz (67.4 kg)  ?06/12/21 148 lb (67.1 kg)  ?04/25/21 146 lb (66.2 kg)  ? ?Patient is in no distress; well developed, nourished and groomed; neck is supple ? ?CARDIOVASCULAR: ?Regular rate and rhythm, BLOWING SYS MURMUR, RADIATING TO LEFT CAROTID ?Examination of peripheral vascular system by observation and palpation is normal ? ?EYES: ?Ophthalmoscopic exam of optic discs and posterior segments is normal; no papilledema or hemorrhages ?No results found. ? ?MUSCULOSKELETAL: ?Gait, strength, tone, movements noted in Neurologic  exam below ? ?NEUROLOGIC: ?MENTAL STATUS:  ?   ? View : No data to display.  ?  ?  ?  ? ?awake, alert, oriented to person, place and time ?recent and remote memory intact ?normal attention and concentration ?language fluent, comprehension intact, naming intact ?fund of knowledge appropriate ? ?CRANIAL NERVE:  ?2nd - no papilledema on fundoscopic exam ?2nd, 3rd, 4th, 6th - pupils equal and reactive to  light, visual fields full to confrontation, extraocular muscles intact, no nystagmus ?5th - facial sensation symmetric ?7th - facial strength symmetric ?8th - hearing intact ?9th - palate elevates symmetrically, uvula midline ?11th - shoulder shrug symmetric ?12th - tongue protrusion midline ? ?MOTOR:  ?normal bulk and tone, full strength in the BUE, BLE; MILD WEAKNESS OF BILATERAL FINGER ABDUCTION WITH ATROPHY OF FDI ? ?SENSORY:  ?normal and symmetric to light touch, temperature, vibration ? ?COORDINATION:  ?finger-nose-finger, fine finger movements normal ? ?REFLEXES:  ?deep tendon reflexes TRACE and symmetric ? ?GAIT/STATION:  ?narrow based gait ? ? ? ? ?DIAGNOSTIC DATA (LABS, IMAGING, TESTING) ?- I reviewed patient records, labs, notes, testing and imaging myself where available. ? ?Lab Results  ?Component Value Date  ? WBC 7.6 04/19/2021  ? HGB 11.6 (L) 04/25/2021  ? HCT 34.0 (L) 04/25/2021  ? MCV 91.4 04/19/2021  ? PLT 217 04/19/2021  ? ?   ?Component Value Date/Time  ? NA 139 04/25/2021 0813  ? NA 140 04/19/2021 1427  ? K 3.7 04/25/2021 0813  ? CL 101 04/19/2021 1427  ? CO2 22 04/19/2021 1427  ? GLUCOSE 93 04/19/2021 1427  ? BUN 20 04/19/2021 1427  ? CREATININE 0.78 04/19/2021 1427  ? CALCIUM 9.2 04/19/2021 1427  ? PROT 6.0 04/19/2021 1427  ? ALBUMIN 4.3 04/19/2021 1427  ? AST 29 04/19/2021 1427  ? ALT 23 04/19/2021 1427  ? ALKPHOS 70 04/19/2021 1427  ? BILITOT 0.2 04/19/2021 1427  ? ?Lab Results  ?Component Value Date  ? CHOL 161 02/09/2020  ? HDL 90 02/09/2020  ? LDLCALC 58 02/09/2020  ? TRIG 68 02/09/2020  ?  CHOLHDL 1.8 02/09/2020  ? ?No results found for: HGBA1C ?No results found for: VITAMINB12 ?No results found for: TSH ? ? ?02/02/21 TTE ? 1. Left ventricular ejection fraction, by estimation, is 60 to 65%. Left

## 2021-06-27 ENCOUNTER — Ambulatory Visit (HOSPITAL_COMMUNITY)
Admission: RE | Admit: 2021-06-27 | Discharge: 2021-06-27 | Disposition: A | Discharge status: Home / Self Care | Payer: Medicare Other | Source: Ambulatory Visit | Attending: Cardiovascular Disease | Admitting: Cardiovascular Disease

## 2021-06-27 DIAGNOSIS — G459 Transient cerebral ischemic attack, unspecified: Secondary | ICD-10-CM | POA: Insufficient documentation

## 2021-06-28 ENCOUNTER — Telehealth: Payer: Self-pay | Admitting: Cardiology

## 2021-06-28 NOTE — Telephone Encounter (Signed)
Freada Bergeron, MD  ?06/27/2021  8:43 PM EDT   ?  ?Her carotid ultrasounds look great with no evidence of disease.   ? ?The patient has been notified of the result and verbalized understanding.  All questions (if any) were answered. ?Antonieta Iba, RN 06/28/2021 1:05 PM  ? ?

## 2021-06-28 NOTE — Telephone Encounter (Signed)
Patient returning call to go over test results.

## 2021-06-30 ENCOUNTER — Ambulatory Visit
Admission: RE | Admit: 2021-06-30 | Discharge: 2021-06-30 | Disposition: A | Discharge status: Home / Self Care | Payer: Medicare Other | Source: Ambulatory Visit | Attending: Diagnostic Neuroimaging | Admitting: Diagnostic Neuroimaging

## 2021-06-30 DIAGNOSIS — G459 Transient cerebral ischemic attack, unspecified: Secondary | ICD-10-CM

## 2021-06-30 DIAGNOSIS — G453 Amaurosis fugax: Secondary | ICD-10-CM | POA: Diagnosis not present

## 2021-07-04 ENCOUNTER — Telehealth: Payer: Self-pay | Admitting: Cardiology

## 2021-07-04 NOTE — Telephone Encounter (Signed)
The patient has been notified of the result and verbalized understanding.  All questions (if any) were answered. ? ?Pt states she does not have any symptoms at all.  She states she never feels any PVC's when they occur.  Pt would like to hold off on starting a beta blocker at this time, and will keep us posted as needed.  ?Pt verbalized understanding and agrees with this plan. ?

## 2021-07-04 NOTE — Telephone Encounter (Signed)
-----   Message from Meriam Sprague, MD sent at 07/04/2021  7:08 AM EDT ----- ?Her heart monitor does not show any Afib but does show that she is having occasional extra beats from the bottom chamber of the heart called premature ventricular contractions. These extra beats are not harmful to her but do correlate with her symptoms. If she would like medication to help suppress the extra beats to improve her symptoms, we can start her on metop  XL at night.  ?

## 2021-07-04 NOTE — Telephone Encounter (Signed)
Patient is returning call to discuss monitor results. 

## 2021-07-05 ENCOUNTER — Encounter: Payer: Self-pay | Admitting: *Deleted

## 2021-09-03 NOTE — Progress Notes (Unsigned)
Patient ID: Regina Tate MRN: [id number] DOB/AGE: [birth year] 76 y.o.  Primary Care Physician:Griffin, Consuella Lose, MD Primary Cardiologist: Laurance Flatten, MD   FOCUSED CARDIOVASCULAR PROBLEM LIST:   1.  Moderate aortic stenosis and aortic valve area 0.83 cm grade, mean gradient 26 mmHg, peak velocity of 3.3 m/s and dimensionless index of 0.33 with normal ejection fraction; EKG demonstrates no bundle-branch blocks; randomized to the clinical surveillance arm of PROGRESS trial 2.  Sjogren's syndrome 3.  Scleroderma 4.  Raynaud's syndrome 5.  Rheumatoid arthritis on Enbrel, methotrexate, and plaquenil   HISTORY OF PRESENT ILLNESS: The patient is seen for follow-up.  When I saw her last she was enrolled in the PROGRESS trial regarding moderate symptomatic aortic stenosis.  She was randomized to the clinical surveillance arm of the trial.  She was seen by Dr. Shari Prows a few months ago.  She reported increasing dyspnea on exertion.  She also reported possible TIA like symptoms.  Carotid ultrasound was negative.  Her monitor was reassuring and showed no atrial fibrillation.  She was seen by neurology and an MRI and MRI were reassuring.  Today she is relatively unchanged.  She can exercise for about 15 minutes and then needs to stop and take a rest.  This is about the same as before.  She denies any chest pain, palpitations, paroxysmal nocturnal dyspnea, orthopnea, presyncope or syncope.  She feels like her symptoms are relatively stable and not gotten worse whatsoever.  She is planning to go to [place] for a few weeks and to visit [employer].   Past Medical History:  Diagnosis Date   Allergic rhinitis    CREST syndrome (HCC)    Deep vein thrombosis (DVT) (HCC)    GERD (gastroesophageal reflux disease)    Hypercholesteremia    Hyperlipidemia    Osteopenia    RA (rheumatoid arthritis) (HCC)    Raynaud's syndrome without gangrene    Scleroderma (HCC)    Sjogren's  syndrome with keratoconjunctivitis sicca (HCC)    Vitamin D deficiency     Past Surgical History:  Procedure Laterality Date   RIGHT/LEFT HEART CATH AND CORONARY ANGIOGRAPHY N/A 04/25/2021   Procedure: RIGHT/LEFT HEART CATH AND CORONARY ANGIOGRAPHY;  Surgeon: Orbie Pyo, MD;  Location: [facility];  Service: Cardiovascular;  Laterality: N/A;    Family History  Problem Relation Age of Onset   Breast cancer Neg Hx     Social History   Socioeconomic History   Marital status: Divorced    Spouse name: Not on file   Number of children: Not on file   Years of education: Not on file   Highest education level: Not on file  Occupational History   Not on file  Tobacco Use   Smoking status: Never   Smokeless tobacco: Never  Substance and Sexual Activity   Alcohol use: Not on file   Drug use: Not on file   Sexual activity: Not on file  Other Topics Concern   Not on file  Social History Narrative   Not on file   Social Determinants of Health   Financial Resource Strain: Not on file  Food Insecurity: Not on file  Transportation Needs: Not on file  Physical Activity: Not on file  Stress: Not on file  Social Connections: Not on file  Intimate Partner Violence: Not on file     Prior to Admission medications   Medication Sig Start Date End Date Taking? Authorizing Provider  amLODipine (NORVASC) 5 MG tablet  Take 5 mg by mouth daily. 11/12/16   [provider]  atorvastatin (LIPITOR) 20 MG tablet Take 1 tablet (20 mg total) by mouth daily. 12/25/20 03/25/21  Meriam Sprague, MD  Biotin 5 MG CAPS Take 1 capsule by mouth daily.    [provider]  Bromelains 500 MG TABS Take by mouth daily.    [provider]  CALCIUM PO Take 1 tablet by mouth daily.    [provider]  Cholecalciferol (VITAMIN D) 2000 units CAPS Take 1 capsule by mouth daily.    [provider]  ENBREL SURECLICK 50 MG/ML injection Inject 1 mL into the skin once  a week. 09/24/16   [provider]  famotidine (PEPCID) 40 MG tablet Take 40 mg by mouth 3 (three) times a week.    [provider]  Flaxseed, Linseed, (FLAX SEED OIL) 1300 MG CAPS Take 1 capsule by mouth daily.    [provider]  fluticasone (FLONASE) 50 MCG/ACT nasal spray Place 1 spray into both nostrils daily.    [provider]  folic acid (FOLVITE) 1 MG tablet Take 1 mg by mouth daily. 11/11/16   [provider]  hydroxychloroquine (PLAQUENIL) 200 MG tablet Take 2 tablets by mouth daily. 11/11/16   [provider]  methotrexate (RHEUMATREX) 2.5 MG tablet Take 8 tablets by mouth once a week. 11/13/16   [provider]  Multiple Vitamin (MULTIVITAMIN) tablet Take 1 tablet by mouth daily.    [provider]  Omega-3 Fatty Acids (FISH OIL) 1000 MG CAPS Take 1 capsule by mouth daily.    [provider]  omeprazole (PRILOSEC) 40 MG capsule Take 1 capsule by mouth 4 (four) times a week. 11/20/16   [provider]  pilocarpine (SALAGEN) 5 MG tablet Take 1 tablet by mouth 2 (two) times daily. 11/12/16   [provider]  Probiotic Product (PROBIOTIC PO) Take 1 tablet by mouth daily.    [provider]  Turmeric 450 MG CAPS Take 1 tablet by mouth daily.    [provider]    Allergies  Allergen Reactions   Codeine     Other reaction(s): makes pt the next day feel like she was drugged   Molds & Smuts     Itching eyes, congestion, headache   Pollen Extract     Itching eyes, congestion, headache    REVIEW OF SYSTEMS:  General: no fevers/chills/night sweats Eyes: no blurry vision, diplopia, or amaurosis ENT: no sore throat or hearing loss Resp: no cough, wheezing, or hemoptysis CV: no edema or palpitations GI: no abdominal pain, nausea, vomiting, diarrhea, or constipation GU: no dysuria, frequency, or hematuria Skin: no rash Neuro: no headache, numbness, tingling, or weakness of  extremities Musculoskeletal: no joint pain or swelling Heme: no bleeding, DVT, or easy bruising Endo: no polydipsia or polyuria  BP 122/76   Pulse 68   Ht 5' 4.5" (1.638 m)   Wt 145 lb (65.8 kg)   SpO2 98%   BMI 24.50 kg/m   PHYSICAL EXAM: GEN:  AO x 3 in no acute distress HEENT: normal Dentition: Normal Neck: JVP normal. +2 carotid upstrokes without bruits. No thyromegaly. Lungs: equal expansion, clear bilaterally CV: Apex is discrete and nondisplaced, RRR with 3/6 SEM  Abd: soft, non-tender, non-distended; no bruit; positive bowel sounds Ext: no edema, ecchymoses, or cyanosis Vascular: 2+ femoral pulses, 2+ radial pulses       Skin: warm and dry without rash Neuro: CN II-XII  grossly intact; motor and sensory grossly intact    DATA AND STUDIES:  EKG: Sinus rhythm without bundle-branch blocks  2D ECHO: 2020 2 aortic valve area 0.83 cm grade with a mean gradient of 26 mmHg and a peak velocity of 3.3 m/s with a normal ejection fraction and no significant other valvular abnormalities  CARDIAC CATH: March 2023 with minimal obstructive coronary artery disease with a normal cardiac output and index with normal filling pressures  STS RISK CALCULATOR:  Procedure: Isolated AVR Risk of Mortality: 2.014% Renal Failure: 1.368% Permanent Stroke: 1.257% Prolonged Ventilation: 4.763% DSW Infection: 0.049% Reoperation: 3.505% Morbidity or Mortality: 7.955% Short Length of Stay: 41.866% Long Length of Stay: 3.176%  Euro Score: 1.63%  CCS angina class: 1  NHYA CLASS: 2-3    ASSESSMENT AND PLAN:   Nonrheumatic aortic valve stenosis  Moderate aortic stenosis - Plan: ECHOCARDIOGRAM COMPLETE  The patient remained stable.  She did have an echocardiogram today that I reviewed.  It looks like her stenosis appears to be in the moderate range.  I will wait for final echo report and we will review this.  Her symptoms are relatively unchanged.  I will see her back in 6  months or earlier if needed with another echocardiogram.   Orbie Pyo, MD  09/06/2021 3:45 PM    [facility] [address] [place], [place], [place]  [address] Phone: [phone]; Fax: [phone]

## 2021-09-06 ENCOUNTER — Encounter: Payer: Self-pay | Admitting: Internal Medicine

## 2021-09-06 ENCOUNTER — Ambulatory Visit (INDEPENDENT_AMBULATORY_CARE_PROVIDER_SITE_OTHER): Payer: Medicare Other | Admitting: Internal Medicine

## 2021-09-06 ENCOUNTER — Ambulatory Visit (HOSPITAL_COMMUNITY): Payer: Medicare Other | Attending: Internal Medicine

## 2021-09-06 VITALS — BP 122/76 | HR 68 | Ht 64.5 in | Wt 145.0 lb

## 2021-09-06 DIAGNOSIS — I350 Nonrheumatic aortic (valve) stenosis: Secondary | ICD-10-CM | POA: Insufficient documentation

## 2021-09-06 LAB — ECHOCARDIOGRAM COMPLETE
AR max vel: 1.03 cm2
AV Area VTI: 1.04 cm2
AV Area mean vel: 0.98 cm2
AV Mean grad: 22 mmHg
AV Peak grad: 36.5 mmHg
Ao pk vel: 3.02 m/s
Area-P 1/2: 4.06 cm2
P 1/2 time: 435 msec
S' Lateral: 3.2 cm

## 2021-09-06 NOTE — Patient Instructions (Signed)
Medication Instructions:  No changes *If you need a refill on your cardiac medications before your next appointment, please call your pharmacy*   Lab Work: none If you have labs (blood work) drawn today and your tests are completely normal, you will receive your results only by: MyChart Message (if you have MyChart) OR A paper copy in the mail If you have any lab test that is abnormal or we need to change your treatment, we will call you to review the results.   Testing/Procedures: ECHO DUE IN 6 MONTHS - PRIOR TO NEXT VISIT WITH DR. Lynnette Caffey Your physician has requested that you have an echocardiogram. Echocardiography is a painless test that uses sound waves to create images of your heart. It provides your doctor with information about the size and shape of your heart and how well your heart's chambers and valves are working. This procedure takes approximately one hour. There are no restrictions for this procedure.   Follow-Up: At [facility], you and your health needs are our priority.  As part of our continuing mission to provide you with exceptional heart care, we have created designated Provider Care Teams.  These Care Teams include your primary Cardiologist (physician) and Advanced Practice Providers (APPs -  Physician Assistants and Nurse Practitioners) who all work together to provide you with the care you need, when you need it.  Your next appointment:   6 month(s)  The format for your next appointment:   In Person  Provider:   Alverda Skeans, MD

## 2021-09-07 ENCOUNTER — Telehealth: Payer: Self-pay | Admitting: *Deleted

## 2021-09-07 DIAGNOSIS — I350 Nonrheumatic aortic (valve) stenosis: Secondary | ICD-10-CM

## 2021-09-07 NOTE — Telephone Encounter (Signed)
-----   Message from Meriam Sprague, MD sent at 09/07/2021 10:25 AM EDT ----- Her echo shows normal pumping function. Her aortic valve remains moderately narrowed. We will repeat an echo in 6 months for continued monitoring.

## 2021-09-07 NOTE — Telephone Encounter (Signed)
The patient has been notified of the result and verbalized understanding.  All questions (if any) were answered.  Pt aware I will go ahead and place the order for repeat echo in 6 months in the system and send a message to our [facility] Schedulers to call her back closer to that time, to arrange this appt.  Pt verbalized understanding and agrees with this plan.

## 2021-09-10 ENCOUNTER — Ambulatory Visit
Admission: RE | Admit: 2021-09-10 | Discharge: 2021-09-10 | Disposition: A | Discharge status: Home / Self Care | Payer: Medicare Other | Source: Ambulatory Visit | Attending: Family Medicine | Admitting: Family Medicine

## 2021-10-01 ENCOUNTER — Other Ambulatory Visit: Payer: Self-pay

## 2021-10-01 ENCOUNTER — Telehealth: Payer: Self-pay | Admitting: Pharmacy Technician

## 2021-10-01 DIAGNOSIS — M810 Age-related osteoporosis without current pathological fracture: Secondary | ICD-10-CM | POA: Insufficient documentation

## 2021-10-01 NOTE — Telephone Encounter (Signed)
Auth Submission: no auth needed Payer: [employer] Medication & CPT/J Code(s) submitted: Reclast (Zolendronic acid) [id number] Route of submission (phone, fax, portal): portal Auth type: Buy/Bill Units/visits requested: x1 dose Reference number: [id number] Approval from: 10/01/21 to 02/24/22

## 2021-10-10 ENCOUNTER — Ambulatory Visit: Payer: Medicare Other

## 2021-11-02 ENCOUNTER — Other Ambulatory Visit: Payer: Self-pay | Admitting: Family Medicine

## 2021-11-02 DIAGNOSIS — Z1231 Encounter for screening mammogram for malignant neoplasm of breast: Secondary | ICD-10-CM

## 2021-12-07 ENCOUNTER — Other Ambulatory Visit: Payer: Self-pay

## 2021-12-07 MED ORDER — ATORVASTATIN CALCIUM 20 MG PO TABS: 20.0000 mg | ORAL_TABLET | Freq: Every day | ORAL | 2 refills | Status: DC

## 2021-12-09 NOTE — Progress Notes (Unsigned)
Cardiology Office Note:    Date:  12/09/2021   ID:  Regina Tate, Regina Tate Jul 21, 1945, MRN [id number]  PCP:  Maurice Small, MD  [facility] Cardiologist:  Meriam Sprague, MD  [facility] Electrophysiologist:  None   Referring MD: Maurice Small, MD   History of Present Illness:    Regina Tate is a 76 y.o. female with a histiry of RA, scleroderma, and moderate aortic stenosis who was initially referred for moderate AS who now returns to clinic for follow-up.  Patient initially saw Dr. [name] in 2018 for possible enrollment into the pulmonary HTN screening program. At that time, she denied any history of cardiac problems. She was active with walking and yoga with no exertional symptoms. Has a very strong family history of CAD. TTE and PFTs at that time without evidence of PAH or pulmonary fibrosis but noted mild aortic stenosis with peak gradient . She was recommended for yearly follow-up.   She underwent TTE on 11/30/19 which showed interval progression in AS with severe valvular thickening and calcification, moderate AS with mean gradient and AVA 1.4cm2. LVEF remained normal at 60-65%. Ca score 119.  When she saw me in clinic on 11/2019, she was doing well without exertional or HF symptoms. She saw Edd Fabian on 01/22/21 where she continued to feel well. TTE was ordered for serial monitoring of AS.   TTE on 02/02/21 showed EF 60-65%, G1DD, trivial MR, mild AR, moderate AS with AVA 0.83cm, mean gradient , Vmax 3.3, DI 0.33. She called clinic on 03/14/21 with symptoms of decreased exercise capacity, fatigue and SOB. She now presents to clinic for further evaluation.  Was seen in clinic on 02/2021 where she was feeling more fatigue and dyspnea with exercise. We referred her to Dr. Lynnette Caffey for enrollment into the Progress Trial for moderate AS. Cath showed mild CAD, normal filling pressures. She is currently enrolled in the control  group.  Was seen in clinic on 05/2021 where she reported an episode where she had transient loss of vision like a "curtain came over her eye." Carotid 06/2021 without significant disease. Cardiac monitor 06/2021 with occasional PVCs (4.3%) and episodes of nonsustained SVT with longest 11.9s but no Afib. She was referred to Neurology where MRI head showed small vessel disease. MRA head was normal.  Was last seen in clinic by Dr. Lynnette Caffey on 09/06/21 where she had stable dyspnea on exertion. No chest pain.   Today, ***  Family history: Sister had CVA 84. Father died MI 11, Aunt 67 CVA, Grandfather died 37 with MI, Mother died at 76 with CHF.   Past Medical History:  Diagnosis Date   Allergic rhinitis    CREST syndrome (HCC)    Deep vein thrombosis (DVT) (HCC)    GERD (gastroesophageal reflux disease)    Hypercholesteremia    Hyperlipidemia    Osteopenia    RA (rheumatoid arthritis) (HCC)    Raynaud's syndrome without gangrene    Scleroderma (HCC)    Sjogren's syndrome with keratoconjunctivitis sicca (HCC)    Vitamin D deficiency     Past Surgical History:  Procedure Laterality Date   RIGHT/LEFT HEART CATH AND CORONARY ANGIOGRAPHY N/A 04/25/2021   Procedure: RIGHT/LEFT HEART CATH AND CORONARY ANGIOGRAPHY;  Surgeon: Orbie Pyo, MD;  Location: [facility] INVASIVE CV [facility];  Service: Cardiovascular;  Laterality: N/A;    Current Medications: No outpatient medications have been marked as taking for the 12/12/21 encounter (Appointment) with Meriam Sprague, MD.  Allergies:   Codeine, Molds & smuts, and Pollen extract   Social History   Socioeconomic History   Marital status: Divorced    Spouse name: Not on file   Number of children: Not on file   Years of education: Not on file   Highest education level: Not on file  Occupational History   Not on file  Tobacco Use   Smoking status: Never   Smokeless tobacco: Never  Substance and Sexual Activity   Alcohol use: Not on file    Drug use: Not on file   Sexual activity: Not on file  Other Topics Concern   Not on file  Social History Narrative   Not on file   Social Determinants of Health   Financial Resource Strain: Not on file  Food Insecurity: Not on file  Transportation Needs: Not on file  Physical Activity: Not on file  Stress: Not on file  Social Connections: Not on file     Family History: Sister had CVA 26. Father died MI 66, Aunt 94 CVA, Grandfather died 53 with MI, Mother died at 66 with CHF.   ROS:   Please see the history of present illness.    Review of Systems  Constitutional:  Negative for chills, fever and malaise/fatigue.  HENT:  Negative for hearing loss and sore throat.   Eyes:  Positive for blurred vision (bilateral eyes).  Respiratory:  Positive for shortness of breath (exertional). Negative for cough.   Cardiovascular:  Negative for chest pain, palpitations, orthopnea, claudication, leg swelling and PND.  Gastrointestinal:  Negative for nausea and vomiting.  Genitourinary:  Negative for dysuria.  Musculoskeletal:  Positive for myalgias ("body stiffening"). Negative for joint pain.  Skin:  Negative for rash.  Neurological:  Positive for dizziness. Negative for loss of consciousness.  Endo/Heme/Allergies:  Does not bruise/bleed easily.  Psychiatric/Behavioral:  Negative for depression.     EKGs/Labs/Other Studies Reviewed:    The following studies were reviewed today: TTE 09-23-21: IMPRESSIONS     1. Left ventricular ejection fraction, by estimation, is 55 to 60%. The  left ventricle has normal function. The left ventricle has no regional  wall motion abnormalities. Left ventricular diastolic parameters are  consistent with Grade I diastolic  dysfunction (impaired relaxation). The average left ventricular global  longitudinal strain is -18.1 %. The global longitudinal strain is normal.   2. Right ventricular systolic function is normal. The right ventricular  size is  normal. There is normal pulmonary artery systolic pressure.   3. The mitral valve is normal in structure. Trivial mitral valve  regurgitation.   4. The aortic valve is calcified. Aortic valve regurgitation is mild.  Moderate aortic valve stenosis.   5. No significant aortic root or ascending aneurysm.   6. The inferior vena cava is normal in size with greater than 50%  respiratory variability, suggesting right atrial pressure of 3 mmHg.   Comparison(s): No significant change from prior study.  Cardiac Monitor 05/02023: Patch wear time was 11 days and 14 hours Predominant rhythm was NSR with average HR 83bpm One nonsustained episode of VT lasting 11 beats 33 runs of SVT with longest lasting 11.9s Rare SVE (<1%), occasional VE (4.3%) Patient triggered events correlate with PVCs No Afib or significant pauses RHC/LHC 03/23: LV end diastolic pressure is normal.   The left ventricular ejection fraction is 55-65% by visual estimate.   1.  Mild obstructive coronary artery disease 2.  Cardiac output of 8.6 L/min and cardiac index of  5.0 L/min/m. 3.  RA pressure of 8/4 with a mean of 3 mmHg, wedge pressure of 8/6 with a mean of 4 mmHg, pulmonary artery pressure 24/7, and papi of greater than 5.  CT Coronary 04/10/21 IMPRESSION: 1. Tricuspid aortic valve with bulky calcification of the NCC. Aortic Valve Calcium score: 931   2. Annular measurements appropriate for 23 mm S3 TAVR (391 mm2).   3. No significant annular or subannular calcifications.   4. Sufficient coronary to annulus distance.   5. Optimal Fluoroscopic Angle for Delivery: LAO 11 CAU 25  TTE 02/02/21: IMPRESSIONS   1. Left ventricular ejection fraction, by estimation, is 60 to 65%. Left ventricular ejection fraction by 3D volume is 60 %. The left ventricle has normal function. The left ventricle has no regional wall motion abnormalities. There is mild concentric left ventricular hypertrophy. Left ventricular diastolic  parameters are consistent with Grade I diastolic dysfunction (impaired relaxation).   2. Right ventricular systolic function is normal. The right ventricular size is normal.   3. The mitral valve is normal in structure. Trivial mitral valve regurgitation. No evidence of mitral stenosis.   4. The aortic valve is calcified. There is severe calcifcation of the aortic valve. There is severe thickening of the aortic valve. Aortic valve regurgitation is mild. Moderate aortic valve stenosis. AVA 0.83cm2 by continuity, mean gradient , Vmax  3.3 m/s, DI 0.33   5. Aortic dilatation noted. There is mild dilatation of the ascending aorta, measuring 36 mm.   6. The inferior vena cava is normal in size with greater than 50% respiratory variability, suggesting right atrial pressure of 3 mmHg.   7. Left atrial size was mildly dilated.   Comparison(s): Prior TTE in 11/2019 showed EF 60%, GLS -22.6%, AS mean gradient 15 mmHg, peak gradient 23.66mmHg, mild AI. Compared to prior, the mean AoV gradient has increased.   TTE 11/30/19: IMPRESSIONS   1. Left ventricular ejection fraction, by estimation, is 60 to 65%. The left ventricle has normal function. The left ventricle has no regional wall motion abnormalities. Left ventricular diastolic parameters are consistent with Grade I diastolic dysfunction (impaired relaxation). Elevated left atrial pressure. The average left ventricular global longitudinal strain is 22.6 %. The global longitudinal strain is normal.   2. Right ventricular systolic function is normal. The right ventricular size is normal.   3. Left atrial size was mildly dilated.   4. The mitral valve is normal in structure. Trivial mitral valve regurgitation. No evidence of mitral stenosis.   5. Tricuspid valve regurgitation is mild to moderate.   6. The aortic valve is normal in structure. There is severe calcifcation of the aortic valve. There is severe thickening of the aortic valve. Aortic valve  regurgitation is mild. Moderate aortic valve stenosis. Aortic valve mean gradient measures 15.0 mmHg.   7. The inferior vena cava is normal in size with greater than 50% respiratory variability, suggesting right atrial pressure of 3 mmHg.   TTE 12/10/16: Study Conclusions   - Left ventricle: The cavity size was normal. Wall thickness was normal. Systolic function was normal. The estimated ejection fraction was in the range of 55% to 60%. Wall motion was normal;    there were no regional wall motion abnormalities. Doppler    parameters are consistent with abnormal left ventricular    relaxation (grade 1 diastolic dysfunction).  - Aortic valve: There was trivial regurgitation. Valve area (Vmax): 1.37 cm^2.  - Mitral valve: Trivial prolapse, involving the posterior leaflet.   -------------------------------------------------------------------  Study data:  No prior study was available for comparison.  Study  status:  Routine.  Procedure:  Transthoracic echocardiography.  Image quality was adequate.  Study completion:  There were no  complications.          Transthoracic echocardiography.  M-mode,  complete 2D, spectral Doppler, and color Doppler.  Birthdate:  Patient birthdate: [birth year].  Age:  Patient is 76 yr old.  Sex:  Gender: female.    BMI: 24.6 kg/m^2.  Blood pressure:     105/69  Patient status:  Outpatient.  Study date:  Study date: 12/10/2016.  Study time: 09:36 AM.  Location:  Echo laboratory.   -------------------------------------------------------------------   -------------------------------------------------------------------  Left ventricle:  The cavity size was normal. Wall thickness was  normal. Systolic function was normal. The estimated ejection  fraction was in the range of 55% to 60%. Wall motion was normal;  there were no regional wall motion abnormalities. Doppler  parameters are consistent with abnormal left ventricular relaxation  (grade 1 diastolic  dysfunction).   -------------------------------------------------------------------  Aortic valve:   Trileaflet; mildly calcified leaflets. Cusp  separation was normal.  Doppler:  Transvalvular velocity was within  the normal range. There was no stenosis. There was trivial  regurgitation.    Peak velocity ratio of LVOT to aortic valve:  0.54. Valve area (Vmax): 1.37 cm^2. Indexed valve area (Vmax): 0.79  cm^2/m^2.    Peak gradient (S): 15 mm Hg.   -------------------------------------------------------------------  Aorta:  Aortic root: The aortic root was normal in size.  Ascending aorta: The ascending aorta was normal in size.   -------------------------------------------------------------------  Mitral valve:   Trivial prolapse, involving the posterior leaflet.  Doppler:  There was trivial regurgitation.   -------------------------------------------------------------------  Left atrium:  The atrium was normal in size.   -------------------------------------------------------------------  Right ventricle:  The cavity size was normal. Wall thickness was  normal. Systolic function was normal.   -------------------------------------------------------------------  Pulmonic valve:    The valve appears to be grossly normal.  Doppler:  There was trivial regurgitation.   -------------------------------------------------------------------  Tricuspid valve:   Structurally normal valve.   Leaflet separation  was normal.  Doppler:  Transvalvular velocity was within the normal  range. There was trivial regurgitation.   -------------------------------------------------------------------  Right atrium:  The atrium was normal in size.   -------------------------------------------------------------------  Pericardium:  The pericardium was normal in appearance.   -------------------------------------------------------------------  Systemic veins:  Inferior vena cava: The vessel was normal in  size. The  respirophasic diameter changes were in the normal range (>= 50%),  consistent with normal central venous pressure.  Cardiac CT 2018: FINDINGS: Non-cardiac: See separate report from [facility].   Ascending Aorta:  Normal size.   Calcifications of the aortic valve.   Pericardium: Normal.   Coronary arteries:  Normal origin.   IMPRESSION: 1. Coronary calcium score of 119. This was 85 percentile for age and sex matched control.   2. Calcifications of the aortic valve.   Carotid doppler 05/2006: IMPRESSION:  No significant carotid stenosis.   EKG:  EKG was not ordered today 03/19/21: Sinus rhythm, rate 78 bpm 12/24/19: NSR with LAD, q waves inferiorly and poor r-wave progression  Recent Labs: 04/19/2021: ALT 23; BUN 20; Creatinine, Ser 0.78; NT-Pro BNP 193; Platelets 217 04/25/2021: Hemoglobin 11.6; Potassium 3.7; Sodium 139  Recent Lipid Panel    Component Value Date/Time   CHOL 161 02/09/2020 0739   TRIG 68 02/09/2020 0739   HDL 90 02/09/2020 0739   CHOLHDL 1.8  02/09/2020 0739   LDLCALC 58 02/09/2020 0739     Physical Exam:    VS:  There were no vitals taken for this visit.    Wt Readings from Last 3 Encounters:  09/06/21 145 lb (65.8 kg)  06/19/21 148 lb 8 oz (67.4 kg)  06/12/21 148 lb (67.1 kg)     GEN:  Well nourished, well developed in no acute distress HEENT: Normal NECK: No JVD; No carotid bruits CARDIAC: RRR, 3/6 crescendo-descrescendo systolic murmur that peaks mid-systole with preserved A2 RESPIRATORY:  Clear to auscultation without rales, wheezing or rhonchi  ABDOMEN: Soft, non-tender, non-distended MUSCULOSKELETAL:  No edema; No deformity  SKIN: Warm and dry NEUROLOGIC:  Alert and oriented x 3 PSYCHIATRIC:  Normal affect   ASSESSMENT:    No diagnosis found.    PLAN:    In order of problems listed above:  #Concern for TIA: Patient had episode where she has a transient loss of vision where she felt like a "curtain came  over her eyes" in 05/2021. This was bilateral and lasted about 10 seconds before resolving. Symptoms concerning for possible TIA. Cardiac monitor without Afib or arrhythmias. Carotids with minimal disease. MRI head with small vessel disease but no acute pathology. MRA head unremarkable. Has not had recurrence. Can consider ILR if another event. -Continue ASA 81mg  daily and lipitor 20mg  daily  #Moderate AS: #Mild Aortic Regurgitation: TTE 01/2021 with interval progression of moderate AS (still in moderate range however) with mean gradient 76mmHg, Vmax 3.14m/s, DI 0.33, AVA 0.83. Patient reports decreased exercise capacity, fatigue and SOB. Was seen by Dr Ali Lowe for enrollment in Progress Trial. She was randomized to the control arm. Cath showed mild nonobstructive disease and normal filling pressures. Repeat TTE 08/2021 with continued moderate AS with mean gradient 64mmHg, Vmax 30m/s, AVA 1.04cm2.  -Enrolled in Progress Trial as part of control group -Cath without obstructive disease with normal filling pressures -Continue follow-up with Dr. Ali Lowe  #Mild nonobstructive CAD: #Strong family history CAD: Cath 04/2021 with mild disease -Continue lipitor 20mg  daily  #Orthostasis: Patient with lightheadedness with changing positions and with hot showers. Likely worsened with moderate AS due to drop in preload. Discussed importance of hydration and ensuring now low blood pressures at home.  -Increase hydration -Slow position changes -Pre-load sensitive due to AS   #Scleroderma #RA: #Sjogren's #Raynauds Normal RV size and function on TTE. PFTs normal 2018. Followed closely by rheum. -Follow-up with rheum as scheduled -Continue immunosuppressants   Medication Adjustments/Labs and Tests Ordered: Current medicines are reviewed at length with the patient today.  Concerns regarding medicines are outlined above.  No orders of the defined types were placed in this encounter.  No orders of the  defined types were placed in this encounter.   There are no Patient Instructions on file for this visit.   I,Mykaella Javier,acting as a scribe for Freada Bergeron, MD.,have documented all relevant documentation on the behalf of Freada Bergeron, MD,as directed by  Freada Bergeron, MD while in the presence of Freada Bergeron, MD.  I, Freada Bergeron, MD, have reviewed all documentation for this visit. The documentation on 12/09/21 for the exam, diagnosis, procedures, and orders are all accurate and complete.   Signed, Freada Bergeron, MD  12/09/2021 2:36 PM    [facility]

## 2021-12-11 ENCOUNTER — Ambulatory Visit
Admission: RE | Admit: 2021-12-11 | Discharge: 2021-12-11 | Disposition: A | Discharge status: Home / Self Care | Payer: Medicare Other | Source: Ambulatory Visit | Attending: Family Medicine | Admitting: Family Medicine

## 2021-12-11 DIAGNOSIS — Z1231 Encounter for screening mammogram for malignant neoplasm of breast: Secondary | ICD-10-CM

## 2021-12-12 ENCOUNTER — Ambulatory Visit: Payer: Medicare Other | Attending: Cardiology | Admitting: Cardiology

## 2021-12-12 ENCOUNTER — Encounter: Payer: Self-pay | Admitting: Cardiology

## 2021-12-12 VITALS — BP 112/74 | HR 76 | Ht 64.5 in | Wt 149.2 lb

## 2021-12-12 DIAGNOSIS — M349 Systemic sclerosis, unspecified: Secondary | ICD-10-CM

## 2021-12-12 DIAGNOSIS — I2584 Coronary atherosclerosis due to calcified coronary lesion: Secondary | ICD-10-CM

## 2021-12-12 DIAGNOSIS — I2510 Atherosclerotic heart disease of native coronary artery without angina pectoris: Secondary | ICD-10-CM

## 2021-12-12 DIAGNOSIS — I951 Orthostatic hypotension: Secondary | ICD-10-CM | POA: Diagnosis not present

## 2021-12-12 DIAGNOSIS — I350 Nonrheumatic aortic (valve) stenosis: Secondary | ICD-10-CM

## 2021-12-12 DIAGNOSIS — R42 Dizziness and giddiness: Secondary | ICD-10-CM

## 2021-12-12 DIAGNOSIS — M3500 Sicca syndrome, unspecified: Secondary | ICD-10-CM

## 2021-12-12 NOTE — Progress Notes (Signed)
Cardiology Office Note:    Date:  12/12/2021   ID:  Regina Tate, Fruchter August 18, 1945, MRN [id number]  PCP:  Ollen Bowl, MD  [facility] Cardiologist:  Meriam Sprague, MD  [facility] Electrophysiologist:  None   Referring MD: Maurice Small, MD   History of Present Illness:    Regina Tate is a 76 y.o. female with a histiry of RA, scleroderma, and moderate aortic stenosis who was initially referred for moderate AS who now returns to clinic for follow-up.  Patient initially saw Dr. [name] in 2018 for possible enrollment into the pulmonary HTN screening program. At that time, she denied any history of cardiac problems. She was active with walking and yoga with no exertional symptoms. Has a very strong family history of CAD. TTE and PFTs at that time without evidence of PAH or pulmonary fibrosis but noted mild aortic stenosis with peak gradient . She was recommended for yearly follow-up.   She underwent TTE on 11/30/19 which showed interval progression in AS with severe valvular thickening and calcification, moderate AS with mean gradient and AVA 1.4cm2. LVEF remained normal at 60-65%. Ca score 119.  When she saw me in clinic on 11/2019, she was doing well without exertional or HF symptoms. She saw Edd Fabian on 01/22/21 where she continued to feel well. TTE was ordered for serial monitoring of AS.   TTE on 02/02/21 showed EF 60-65%, G1DD, trivial MR, mild AR, moderate AS with AVA 0.83cm, mean gradient , Vmax 3.3, DI 0.33. She called clinic on 03/14/21 with symptoms of decreased exercise capacity, fatigue and SOB. She now presents to clinic for further evaluation.  Was seen in clinic on 02/2021 where she was feeling more fatigue and dyspnea with exercise. We referred her to Dr. Lynnette Caffey for enrollment into the Progress Trial for moderate AS. Cath showed mild CAD, normal filling pressures. She is currently enrolled in the control  group.  Was seen in clinic on 05/2021 where she reported an episode where she had transient loss of vision like a "curtain came over her eye." Carotid 06/2021 without significant disease. Cardiac monitor 06/2021 with occasional PVCs (4.3%) and episodes of nonsustained SVT with longest 11.9s but no Afib. She was referred to Neurology where MRI head showed small vessel disease. MRA head was normal.  Was last seen in clinic by Dr. Lynnette Caffey on 09/06/21 where she had stable dyspnea on exertion. No chest pain. Reviewed echocardiogram 08/2021 which revealed normal pumping function and her aortic valve remained moderately narrowed.  Today, the patient states that she continues to suffer from her ongoing symptoms including shortness of breath. On some days she is only able to tolerate 10 minutes of exercise instead of her goal of 15 minutes. However, she is able to perform water aerobic exercises for up to an hour. She denies any chest pain or palpitations.  This morning she was rushing to arrive at this appointment when she became mildly lightheaded. She sat down until her symptoms subsided. Similar episodes occur about a couple times a week. To stay hydrated she keeps water bottles in her car and at home. Of note, she does not monitor her blood pressures or own a cuff. In clinic today her blood pressure is 112/74; usually she takes amlodipine in the morning.  Regarding her vision changes she has been to neurology. She reports that there were no indications of stroke. It was thought that she developed minimal vessel changes attributable to aging.  Sometimes at  night she may feel some tightness in her ankles. After lying down for a time her swelling resolves.  She denies any headaches, syncope, orthopnea, or PND.  Family history: Sister had CVA 33. Father died MI 6, Aunt 50 CVA, Grandfather died 49 with MI, Mother died at 74 with CHF.   Past Medical History:  Diagnosis Date   Allergic rhinitis    CREST  syndrome (HCC)    Deep vein thrombosis (DVT) (HCC)    GERD (gastroesophageal reflux disease)    Hypercholesteremia    Hyperlipidemia    Osteopenia    RA (rheumatoid arthritis) (HCC)    Raynaud's syndrome without gangrene    Scleroderma (HCC)    Sjogren's syndrome with keratoconjunctivitis sicca (HCC)    Vitamin D deficiency     Past Surgical History:  Procedure Laterality Date   RIGHT/LEFT HEART CATH AND CORONARY ANGIOGRAPHY N/A 04/25/2021   Procedure: RIGHT/LEFT HEART CATH AND CORONARY ANGIOGRAPHY;  Surgeon: Orbie Pyo, MD;  Location: [facility];  Service: Cardiovascular;  Laterality: N/A;    Current Medications: Current Meds  Medication Sig   amLODipine (NORVASC) 5 MG tablet Take 5 mg by mouth daily.   aspirin EC 81 MG tablet Take 1 tablet (81 mg total) by mouth daily. Swallow whole.   atorvastatin (LIPITOR) 20 MG tablet Take 1 tablet (20 mg total) by mouth daily.   Biotin 5 MG CAPS Take 5 mg by mouth daily.   Bromelains 500 MG TABS Take 500 mg by mouth daily.   CALCIUM PO Take 1,200 mg by mouth daily. Gummies   Cholecalciferol (VITAMIN D) 2000 units CAPS Take 2,000 Units by mouth daily.   ENBREL SURECLICK 50 MG/ML injection Inject 50 mg into the skin once a week.   famotidine (PEPCID) 40 MG tablet Take 40 mg by mouth 3 (three) times a week.   Flaxseed, Linseed, (FLAX SEED OIL) 1000 MG CAPS Take 1,000 mg by mouth daily.   fluticasone (FLONASE) 50 MCG/ACT nasal spray Place 1 spray into both nostrils daily.   folic acid (FOLVITE) 1 MG tablet Take 1 mg by mouth daily.   hydroxychloroquine (PLAQUENIL) 200 MG tablet Take 200 mg by mouth 2 (two) times daily.   methotrexate (RHEUMATREX) 2.5 MG tablet Take 10 mg by mouth 2 (two) times a week. Thursday and Friday   Multiple Vitamin (MULTIVITAMIN) tablet Take 1 tablet by mouth daily.   Omega-3 Fatty Acids (FISH OIL) 1000 MG CAPS Take 2,000 mg by mouth daily.   omeprazole (PRILOSEC) 40 MG capsule Take 40 mg by mouth 4 (four)  times a week.   pilocarpine (SALAGEN) 5 MG tablet Take 5 mg by mouth 2 (two) times daily.   Polyethyl Glycol-Propyl Glycol (SYSTANE) 0.4-0.3 % SOLN Place 1 drop into both eyes daily as needed (lubricant).   Probiotic Product (PROBIOTIC PO) Take 1 capsule by mouth 2 (two) times a week.   Turmeric 450 MG CAPS Take 450 mg by mouth 2 (two) times daily.   [DISCONTINUED] saccharomyces boulardii (FLORASTOR) 250 MG capsule 1 capsule Orally Twice a day     Allergies:   Codeine, Molds & smuts, and Pollen extract   Social History   Socioeconomic History   Marital status: Divorced    Spouse name: Not on file   Number of children: Not on file   Years of education: Not on file   Highest education level: Not on file  Occupational History   Not on file  Tobacco Use   Smoking  status: Never   Smokeless tobacco: Never  Substance and Sexual Activity   Alcohol use: Not on file   Drug use: Not on file   Sexual activity: Not on file  Other Topics Concern   Not on file  Social History Narrative   Not on file   Social Determinants of Health   Financial Resource Strain: Not on file  Food Insecurity: Not on file  Transportation Needs: Not on file  Physical Activity: Not on file  Stress: Not on file  Social Connections: Not on file     Family History: Sister had CVA 70. Father died MI 36, Aunt 76 CVA, Grandfather died 35 with MI, Mother died at 17 with CHF.   ROS:   Please see the history of present illness.    Review of Systems  Constitutional:  Negative for chills, fever and malaise/fatigue.  HENT:  Negative for hearing loss and sore throat.   Eyes:  Positive for blurred vision (bilateral eyes).  Respiratory:  Positive for shortness of breath (exertional). Negative for cough.   Cardiovascular:  Positive for leg swelling (Nocturnal tightness in ankles). Negative for chest pain, palpitations, orthopnea, claudication and PND.  Gastrointestinal:  Negative for nausea and vomiting.   Genitourinary:  Negative for dysuria.  Musculoskeletal:  Positive for myalgias ("body stiffening"). Negative for joint pain.  Skin:  Negative for rash.  Neurological:  Positive for dizziness. Negative for loss of consciousness.  Endo/Heme/Allergies:  Does not bruise/bleed easily.  Psychiatric/Behavioral:  Negative for depression.     EKGs/Labs/Other Studies Reviewed:    The following studies were reviewed today:  TTE 09/06/21: IMPRESSIONS   1. Left ventricular ejection fraction, by estimation, is 55 to 60%. The  left ventricle has normal function. The left ventricle has no regional  wall motion abnormalities. Left ventricular diastolic parameters are  consistent with Grade I diastolic  dysfunction (impaired relaxation). The average left ventricular global  longitudinal strain is -18.1 %. The global longitudinal strain is normal.   2. Right ventricular systolic function is normal. The right ventricular  size is normal. There is normal pulmonary artery systolic pressure.   3. The mitral valve is normal in structure. Trivial mitral valve  regurgitation.   4. The aortic valve is calcified. Aortic valve regurgitation is mild.  Moderate aortic valve stenosis.   5. No significant aortic root or ascending aneurysm.   6. The inferior vena cava is normal in size with greater than 50%  respiratory variability, suggesting right atrial pressure of 3 mmHg.   Comparison(s): No significant change from prior study.   Cardiac Monitor 05/02023: Patch wear time was 11 days and 14 hours Predominant rhythm was NSR with average HR 83bpm One nonsustained episode of VT lasting 11 beats 33 runs of SVT with longest lasting 11.9s Rare SVE (<1%), occasional VE (4.3%) Patient triggered events correlate with PVCs No Afib or significant pauses  RHC/LHC 33/29: LV end diastolic pressure is normal.   The left ventricular ejection fraction is 55-65% by visual estimate.   1.  Mild obstructive coronary  artery disease 2.  Cardiac output of 8.6 L/min and cardiac index of 5.0 L/min/m. 3.  RA pressure of 8/4 with a mean of 3 mmHg, wedge pressure of 8/6 with a mean of 4 mmHg, pulmonary artery pressure 24/7, and papi of greater than 5.  CT Coronary 04/10/21 IMPRESSION: 1. Tricuspid aortic valve with bulky calcification of the NCC. Aortic Valve Calcium score: 931   2. Annular measurements appropriate for 23  mm S3 TAVR (391 mm2).   3. No significant annular or subannular calcifications.   4. Sufficient coronary to annulus distance.   5. Optimal Fluoroscopic Angle for Delivery: LAO 11 CAU 25  TTE 02/02/21: IMPRESSIONS   1. Left ventricular ejection fraction, by estimation, is 60 to 65%. Left ventricular ejection fraction by 3D volume is 60 %. The left ventricle has normal function. The left ventricle has no regional wall motion abnormalities. There is mild concentric left ventricular hypertrophy. Left ventricular diastolic parameters are consistent with Grade I diastolic dysfunction (impaired relaxation).   2. Right ventricular systolic function is normal. The right ventricular size is normal.   3. The mitral valve is normal in structure. Trivial mitral valve regurgitation. No evidence of mitral stenosis.   4. The aortic valve is calcified. There is severe calcifcation of the aortic valve. There is severe thickening of the aortic valve. Aortic valve regurgitation is mild. Moderate aortic valve stenosis. AVA 0.83cm2 by continuity, mean gradient , Vmax  3.3 m/s, DI 0.33   5. Aortic dilatation noted. There is mild dilatation of the ascending aorta, measuring 36 mm.   6. The inferior vena cava is normal in size with greater than 50% respiratory variability, suggesting right atrial pressure of 3 mmHg.   7. Left atrial size was mildly dilated.   Comparison(s): Prior TTE in 11/2019 showed EF 60%, GLS -22.6%, AS mean gradient 15 mmHg, peak gradient 23.27mmHg, mild AI. Compared to prior, the mean AoV  gradient has increased.   TTE 11/30/19: IMPRESSIONS   1. Left ventricular ejection fraction, by estimation, is 60 to 65%. The left ventricle has normal function. The left ventricle has no regional wall motion abnormalities. Left ventricular diastolic parameters are consistent with Grade I diastolic dysfunction (impaired relaxation). Elevated left atrial pressure. The average left ventricular global longitudinal strain is 22.6 %. The global longitudinal strain is normal.   2. Right ventricular systolic function is normal. The right ventricular size is normal.   3. Left atrial size was mildly dilated.   4. The mitral valve is normal in structure. Trivial mitral valve regurgitation. No evidence of mitral stenosis.   5. Tricuspid valve regurgitation is mild to moderate.   6. The aortic valve is normal in structure. There is severe calcifcation of the aortic valve. There is severe thickening of the aortic valve. Aortic valve regurgitation is mild. Moderate aortic valve stenosis. Aortic valve mean gradient measures 15.0 mmHg.   7. The inferior vena cava is normal in size with greater than 50% respiratory variability, suggesting right atrial pressure of 3 mmHg.   TTE 12/10/16: Study Conclusions   - Left ventricle: The cavity size was normal. Wall thickness was normal. Systolic function was normal. The estimated ejection fraction was in the range of 55% to 60%. Wall motion was normal;    there were no regional wall motion abnormalities. Doppler    parameters are consistent with abnormal left ventricular    relaxation (grade 1 diastolic dysfunction).  - Aortic valve: There was trivial regurgitation. Valve area (Vmax): 1.37 cm^2.  - Mitral valve: Trivial prolapse, involving the posterior leaflet.   -------------------------------------------------------------------  Study data:  No prior study was available for comparison.  Study  status:  Routine.  Procedure:  Transthoracic echocardiography.  Image  quality was adequate.  Study completion:  There were no  complications.          Transthoracic echocardiography.  M-mode,  complete 2D, spectral Doppler, and color Doppler.  Birthdate:  Patient  birthdate: 1945-12-23.  Age:  Patient is 76 yr old.  Sex:  Gender: female.    BMI: 24.6 kg/m^2.  Blood pressure:     105/69  Patient status:  Outpatient.  Study date:  Study date: 12/10/2016.  Study time: 09:36 AM.  Location:  Echo laboratory.   -------------------------------------------------------------------   -------------------------------------------------------------------  Left ventricle:  The cavity size was normal. Wall thickness was  normal. Systolic function was normal. The estimated ejection  fraction was in the range of 55% to 60%. Wall motion was normal;  there were no regional wall motion abnormalities. Doppler  parameters are consistent with abnormal left ventricular relaxation  (grade 1 diastolic dysfunction).   -------------------------------------------------------------------  Aortic valve:   Trileaflet; mildly calcified leaflets. Cusp  separation was normal.  Doppler:  Transvalvular velocity was within  the normal range. There was no stenosis. There was trivial  regurgitation.    Peak velocity ratio of LVOT to aortic valve:  0.54. Valve area (Vmax): 1.37 cm^2. Indexed valve area (Vmax): 0.79  cm^2/m^2.    Peak gradient (S): 15 mm Hg.   -------------------------------------------------------------------  Aorta:  Aortic root: The aortic root was normal in size.  Ascending aorta: The ascending aorta was normal in size.   -------------------------------------------------------------------  Mitral valve:   Trivial prolapse, involving the posterior leaflet.  Doppler:  There was trivial regurgitation.   -------------------------------------------------------------------  Left atrium:  The atrium was normal in size.    -------------------------------------------------------------------  Right ventricle:  The cavity size was normal. Wall thickness was  normal. Systolic function was normal.   -------------------------------------------------------------------  Pulmonic valve:    The valve appears to be grossly normal.  Doppler:  There was trivial regurgitation.   -------------------------------------------------------------------  Tricuspid valve:   Structurally normal valve.   Leaflet separation  was normal.  Doppler:  Transvalvular velocity was within the normal  range. There was trivial regurgitation.   -------------------------------------------------------------------  Right atrium:  The atrium was normal in size.   -------------------------------------------------------------------  Pericardium:  The pericardium was normal in appearance.   -------------------------------------------------------------------  Systemic veins:  Inferior vena cava: The vessel was normal in size. The  respirophasic diameter changes were in the normal range (>= 50%),  consistent with normal central venous pressure.  Cardiac CT 2018: FINDINGS: Non-cardiac: See separate report from [facility].   Ascending Aorta:  Normal size.   Calcifications of the aortic valve.   Pericardium: Normal.   Coronary arteries:  Normal origin.   IMPRESSION: 1. Coronary calcium score of 119. This was 27 percentile for age and sex matched control.   2. Calcifications of the aortic valve.   Carotid doppler 05/2006: IMPRESSION:  No significant carotid stenosis.   EKG:  EKG is personally reviewed. 12/12/2021:  EKG was not ordered. 04/25/2021 (Dr. Lynnette Caffey):  Sinus rhythm without bundle-branch blocks 03/19/21: Sinus rhythm, rate 78 bpm 12/24/19: NSR with LAD, q waves inferiorly and poor r-wave progression  Recent Labs: 04/19/2021: ALT 23; BUN 20; Creatinine, Ser 0.78; NT-Pro BNP 193; Platelets 217 04/25/2021:  Hemoglobin 11.6; Potassium 3.7; Sodium 139   Recent Lipid Panel    Component Value Date/Time   CHOL 161 02/09/2020 0739   TRIG 68 02/09/2020 0739   HDL 90 02/09/2020 0739   CHOLHDL 1.8 02/09/2020 0739   LDLCALC 58 02/09/2020 0739     Physical Exam:    VS:  BP 112/74   Pulse 76   Ht 5' 4.5" (1.638 m)   Wt 149 lb 3.2 oz (67.7 kg)   BMI 25.21  kg/m     Wt Readings from Last 3 Encounters:  12/12/21 149 lb 3.2 oz (67.7 kg)  09/06/21 145 lb (65.8 kg)  06/19/21 148 lb 8 oz (67.4 kg)     GEN:  Well nourished, well developed in no acute distress HEENT: Normal NECK: No JVD; No carotid bruits CARDIAC: RRR, 3/6 crescendo-descrescendo systolic murmur that peaks mid-systole with preserved A2 RESPIRATORY:  Clear to auscultation without rales, wheezing or rhonchi  ABDOMEN: Soft, non-tender, non-distended MUSCULOSKELETAL:  No edema, warm SKIN: Warm and dry NEUROLOGIC:  Alert and oriented x 3 PSYCHIATRIC:  Normal affect   ASSESSMENT:    1. Moderate aortic stenosis   2. Orthostatic hypotension   3. Coronary artery calcification of native artery   4. Scleroderma (HCC)   5. Sjogren's syndrome, with unspecified organ involvement (HCC)   6. Dizziness     PLAN:    In order of problems listed above:  #Concern for TIA: Patient had episode where she has a transient loss of vision where she felt like a "curtain came over her eyes" in 05/2021. This was bilateral and lasted about 10 seconds before resolving. Symptoms concerning for possible TIA. Cardiac monitor without Afib or arrhythmias. Carotids with minimal disease. MRI head with small vessel disease but no acute pathology. MRA head unremarkable. Has not had recurrence. Can consider ILR if another event. -Continue ASA  daily and lipitor  daily  #Moderate AS: #Mild Aortic Regurgitation: TTE 01/2021 with interval progression of moderate AS (still in moderate range however) with mean gradient , Vmax 3.52m/s, DI 0.33, AVA  0.83. Patient reports decreased exercise capacity, fatigue and SOB. Was seen by Dr Lynnette Caffey for enrollment in Progress Trial. She was randomized to the control arm. Cath showed mild nonobstructive disease and normal filling pressures. Repeat TTE 08/2021 with continued moderate AS with mean gradient , Vmax 6m/s, AVA 1.04cm2.  -Enrolled in Progress Trial as part of control group -Cath without obstructive disease with normal filling pressures -Continue follow-up with Dr. Lynnette Caffey  #Mild nonobstructive CAD: #Strong family history CAD: Cath 04/2021 with mild disease. -Continue lipitor  daily -LDL at goal of 66  #Orthostasis: Patient with lightheadedness with changing positions and with hot showers. Likely worsened with moderate AS due to drop in preload. Discussed importance of hydration and ensuring now low blood pressures at home.  -Increase hydration -Slow position changes -Pre-load sensitive due to AS  -May need to decrease amlodipine (takes for Raynauds)  #Scleroderma #RA: #Sjogren's #Raynauds Normal RV size and function on TTE. PFTs normal 2018. Followed closely by rheum. -Follow-up with rheum as scheduled -Continue immunosuppressants  Follow-up:  6 months.  Medication Adjustments/Labs and Tests Ordered: Current medicines are reviewed at length with the patient today.  Concerns regarding medicines are outlined above.   No orders of the defined types were placed in this encounter.  No orders of the defined types were placed in this encounter.  Patient Instructions  Medication Instructions:   Your physician recommends that you continue on your current medications as directed. Please refer to the Current Medication list given to you today.  *If you need a refill on your cardiac medications before your next appointment, please call your pharmacy*    Follow-Up: At [facility], you and your health needs are our priority.  As part of our continuing mission  to provide you with exceptional heart care, we have created designated Provider Care Teams.  These Care Teams include your primary Cardiologist (physician) and Advanced Practice Providers (APPs -  Physician Assistants and Nurse Practitioners) who all work together to provide you with the care you need, when you need it.  We recommend signing up for the patient portal called "MyChart".  Sign up information is provided on this After Visit Summary.  MyChart is used to connect with patients for Virtual Visits (Telemedicine).  Patients are able to view lab/test results, encounter notes, upcoming appointments, etc.  Non-urgent messages can be sent to your provider as well.   To learn more about what you can do with MyChart, go to [email or web address].    Your next appointment:   6 month(s)  The format for your next appointment:   In Person  Provider:   Meriam Sprague, MD      Important Information About Sugar        I,Mathew Stumpf,acting as a scribe for Meriam Sprague, MD.,have documented all relevant documentation on the behalf of Meriam Sprague, MD,as directed by  Meriam Sprague, MD while in the presence of Meriam Sprague, MD.  I, Meriam Sprague, MD, have reviewed all documentation for this visit. The documentation on 12/12/21 for the exam, diagnosis, procedures, and orders are all accurate and complete.   Signed, Meriam Sprague, MD  12/12/2021 9:47 AM    [facility]

## 2021-12-12 NOTE — Patient Instructions (Signed)
Medication Instructions:   Your physician recommends that you continue on your current medications as directed. Please refer to the Current Medication list given to you today.  *If you need a refill on your cardiac medications before your next appointment, please call your pharmacy*   Follow-Up: At [facility], you and your health needs are our priority.  As part of our continuing mission to provide you with exceptional heart care, we have created designated Provider Care Teams.  These Care Teams include your primary Cardiologist (physician) and Advanced Practice Providers (APPs -  Physician Assistants and Nurse Practitioners) who all work together to provide you with the care you need, when you need it.  We recommend signing up for the patient portal called "MyChart".  Sign up information is provided on this After Visit Summary.  MyChart is used to connect with patients for Virtual Visits (Telemedicine).  Patients are able to view lab/test results, encounter notes, upcoming appointments, etc.  Non-urgent messages can be sent to your provider as well.   To learn more about what you can do with MyChart, go to [email or web address].    Your next appointment:   6 month(s)  The format for your next appointment:   In Person  Provider:   Heather E Pemberton, MD     Important Information About Sugar       

## 2022-03-07 ENCOUNTER — Ambulatory Visit (HOSPITAL_COMMUNITY): Payer: Medicare Other | Attending: Cardiology

## 2022-03-07 DIAGNOSIS — I350 Nonrheumatic aortic (valve) stenosis: Secondary | ICD-10-CM | POA: Diagnosis present

## 2022-03-07 LAB — ECHOCARDIOGRAM COMPLETE
AR max vel: 0.85 cm2
AV Area VTI: 0.81 cm2
AV Area mean vel: 0.76 cm2
AV Mean grad: 27.8 mmHg
AV Peak grad: 43.8 mmHg
Ao pk vel: 3.31 m/s
Area-P 1/2: 3.45 cm2
P 1/2 time: 473 ms
S' Lateral: 2.8 cm

## 2022-03-08 ENCOUNTER — Telehealth: Payer: Self-pay | Admitting: Cardiology

## 2022-03-08 NOTE — Telephone Encounter (Signed)
Patient was returning call for results. Please advise °

## 2022-03-08 NOTE — Telephone Encounter (Signed)
-----  Message from Freada Bergeron, MD sent at 03/08/2022  1:33 PM EST ----- Her echo shows slight worsening of the narrowing of her aortic valve but she still falls within the moderate range. She is enrolled in the PROGRESS trial. Clarene Duke, do you want echoes every 6 months for her?

## 2022-03-08 NOTE — Telephone Encounter (Signed)
The patient has been notified of the result and verbalized understanding.  All questions (if any) were answered.  Pt aware that we will await from Dr. Ali Lowe, exactly when she will need her repeat echo again with the PROGRESS trial she is in.  Pt will see Dr. Ali Lowe on next Monday 1/15 for follow-up.  She is aware that the repeat echo will be arranged at that visit for her.   Pt verbalized understanding and agrees with this plan.  She is aware that I will route this echo result to both Dr. Ali Lowe and his RN for reference and to arrange repeat echo for the pt at the visit with her on 03/11/22.

## 2022-03-11 ENCOUNTER — Ambulatory Visit: Payer: Medicare Other | Attending: Internal Medicine | Admitting: Internal Medicine

## 2022-03-11 ENCOUNTER — Encounter: Payer: Self-pay | Admitting: Internal Medicine

## 2022-03-11 VITALS — BP 110/60 | HR 87 | Ht 64.0 in | Wt 146.8 lb

## 2022-03-11 DIAGNOSIS — M349 Systemic sclerosis, unspecified: Secondary | ICD-10-CM

## 2022-03-11 DIAGNOSIS — M3500 Sicca syndrome, unspecified: Secondary | ICD-10-CM

## 2022-03-11 DIAGNOSIS — I7300 Raynaud's syndrome without gangrene: Secondary | ICD-10-CM

## 2022-03-11 DIAGNOSIS — M069 Rheumatoid arthritis, unspecified: Secondary | ICD-10-CM

## 2022-03-11 DIAGNOSIS — I350 Nonrheumatic aortic (valve) stenosis: Secondary | ICD-10-CM | POA: Diagnosis not present

## 2022-03-11 DIAGNOSIS — M359 Systemic involvement of connective tissue, unspecified: Secondary | ICD-10-CM

## 2022-03-11 NOTE — Progress Notes (Signed)
Patient ID: Brionne Mertz MRN: [id number] DOB/AGE: [birth year] 77 y.o.  Primary Care Physician:Pahwani, Kasandra Knudsen, MD Primary Cardiologist: Laurance Flatten, MD   FOCUSED CARDIOVASCULAR PROBLEM LIST:   1.  Moderate aortic stenosis and aortic valve area 0.83 cm grade, mean gradient 26 mmHg, peak velocity of 3.3 m/s and dimensionless index of 0.33 with normal ejection fraction; EKG demonstrates no bundle-branch blocks; randomized to the clinical surveillance arm of PROGRESS trial 2.  Sjogren's syndrome 3.  Scleroderma 4.  Raynaud's syndrome 5.  Rheumatoid arthritis on Enbrel, methotrexate, and plaquenil   HISTORY OF PRESENT ILLNESS: Today  July 2023 visit:  The patient is seen for follow-up.  When I saw her last she was enrolled in the PROGRESS trial regarding moderate symptomatic aortic stenosis.  She was randomized to the clinical surveillance arm of the trial.  She was seen by Dr. Shari Prows a few months ago.  She reported increasing dyspnea on exertion.  She also reported possible TIA like symptoms.  Carotid ultrasound was negative.  Her monitor was reassuring and showed no atrial fibrillation.  She was seen by neurology and an MRI and MRI were reassuring.  Today she is relatively unchanged.  She can exercise for about 15 minutes and then needs to stop and take a rest.  This is about the same as before.  She denies any chest pain, palpitations, paroxysmal nocturnal dyspnea, orthopnea, presyncope or syncope.  She feels like her symptoms are relatively stable and not gotten worse whatsoever.  She is planning to go to [place] for a few weeks and to visit [employer].  Plan:  6 month follow up with [facility].  Today: Patient had an echocardiogram in the interim which demonstrated some progression of her aortic stenosis.  However she is still squarely in the moderate range, her stroke-volume index is around 40 cc/m.  Yeah the patient tells me that she has developed a little bit more  shortness of breath versus when I saw her last.  When she walks up the ramp to go to the call [place] or when she goes up to her seat at the [employer] she does feel short of breath.  She has had some chronic dizziness issues predating my last visit with her.  This would happen may be once or twice a week particularly in the morning.  It seems to be better if she drinks water or eats something.  Now they might happen may be 2 or 3 times.  She has had no frank syncope however.  They seem to get better when she drinks water.  She was counseled by her primary cardiologist to drink water in the morning before she gets up.  She fortunately has not required any emergency room visits or hospitalizations.  She went on a trip to [facility] in the interim since I saw her last and did fairly well there.   Past Medical History:  Diagnosis Date   Allergic rhinitis    CREST syndrome (HCC)    Deep vein thrombosis (DVT) (HCC)    GERD (gastroesophageal reflux disease)    Hypercholesteremia    Hyperlipidemia    Osteopenia    RA (rheumatoid arthritis) (HCC)    Raynaud's syndrome without gangrene    Scleroderma (HCC)    Sjogren's syndrome with keratoconjunctivitis sicca (HCC)    Vitamin D deficiency     Past Surgical History:  Procedure Laterality Date   RIGHT/LEFT HEART CATH AND CORONARY ANGIOGRAPHY N/A 04/25/2021   Procedure: RIGHT/LEFT HEART  CATH AND CORONARY ANGIOGRAPHY;  Surgeon: Early Osmond, MD;  Location: [facility];  Service: Cardiovascular;  Laterality: N/A;    Family History  Problem Relation Age of Onset   Breast cancer Neg Hx     Social History   Socioeconomic History   Marital status: Divorced    Spouse name: Not on file   Number of children: Not on file   Years of education: Not on file   Highest education level: Not on file  Occupational History   Not on file  Tobacco Use   Smoking status: Never   Smokeless tobacco: Never  Substance and Sexual Activity   Alcohol use: Not on  file   Drug use: Not on file   Sexual activity: Not on file  Other Topics Concern   Not on file  Social History Narrative   Not on file   Social Determinants of Health   Financial Resource Strain: Not on file  Food Insecurity: Not on file  Transportation Needs: Not on file  Physical Activity: Not on file  Stress: Not on file  Social Connections: Not on file  Intimate Partner Violence: Not on file     Prior to Admission medications   Medication Sig Start Date End Date Taking? Authorizing Provider  amLODipine (NORVASC) 5 MG tablet Take 5 mg by mouth daily. 11/12/16   [provider]  atorvastatin (LIPITOR) 20 MG tablet Take 1 tablet (20 mg total) by mouth daily. 12/25/20 03/25/21  Freada Bergeron, MD  Biotin 5 MG CAPS Take 1 capsule by mouth daily.    [provider]  Bromelains 500 MG TABS Take by mouth daily.    [provider]  CALCIUM PO Take 1 tablet by mouth daily.    [provider]  Cholecalciferol (VITAMIN D) 2000 units CAPS Take 1 capsule by mouth daily.    [provider]  ENBREL SURECLICK 50 MG/ML injection Inject 1 mL into the skin once a week. 09/24/16   [provider]  famotidine (PEPCID) 40 MG tablet Take 40 mg by mouth 3 (three) times a week.    [provider]  Flaxseed, Linseed, (FLAX SEED OIL) 1300 MG CAPS Take 1 capsule by mouth daily.    [provider]  fluticasone (FLONASE) 50 MCG/ACT nasal spray Place 1 spray into both nostrils daily.    [provider]  folic acid (FOLVITE) 1 MG tablet Take 1 mg by mouth daily. 11/11/16   [provider]  hydroxychloroquine (PLAQUENIL) 200 MG tablet Take 2 tablets by mouth daily. 11/11/16   [provider]  methotrexate (RHEUMATREX) 2.5 MG tablet Take 8 tablets by mouth once a week. 11/13/16   [provider]  Multiple Vitamin (MULTIVITAMIN) tablet Take 1 tablet by mouth daily.    [provider]  Omega-3  Fatty Acids (FISH OIL) 1000 MG CAPS Take 1 capsule by mouth daily.    [provider]  omeprazole (PRILOSEC) 40 MG capsule Take 1 capsule by mouth 4 (four) times a week. 11/20/16   [provider]  pilocarpine (SALAGEN) 5 MG tablet Take 1 tablet by mouth 2 (two) times daily. 11/12/16   [provider]  Probiotic Product (PROBIOTIC PO) Take 1 tablet by mouth daily.    [provider]  Turmeric 450 MG CAPS Take 1 tablet by mouth daily.    [provider]    Allergies  Allergen Reactions   Codeine     Other reaction(s):  makes pt the next day feel like she was drugged   Molds & Smuts     Itching eyes, congestion, headache   Pollen Extract     Itching eyes, congestion, headache    REVIEW OF SYSTEMS:  General: no fevers/chills/night sweats Eyes: no blurry vision, diplopia, or amaurosis ENT: no sore throat or hearing loss Resp: no cough, wheezing, or hemoptysis CV: no edema or palpitations GI: no abdominal pain, nausea, vomiting, diarrhea, or constipation GU: no dysuria, frequency, or hematuria Skin: no rash Neuro: no headache, numbness, tingling, or weakness of extremities Musculoskeletal: no joint pain or swelling Heme: no bleeding, DVT, or easy bruising Endo: no polydipsia or polyuria  BP 110/60   Pulse 87   Ht 5\' 4"  (1.626 m)   Wt 146 lb 12.8 oz (66.6 kg)   BMI 25.20 kg/m   PHYSICAL EXAM: GEN:  AO x 3 in no acute distress HEENT: normal Dentition: Normal Neck: JVP normal. +2 carotid upstrokes without bruits. No thyromegaly. Lungs: equal expansion, clear bilaterally CV: Apex is discrete and nondisplaced, RRR with 3/6 SEM  Abd: soft, non-tender, non-distended; no bruit; positive bowel sounds Ext: no edema, ecchymoses, or cyanosis Vascular: 2+ femoral pulses, 2+ radial pulses       Skin: warm and dry without rash Neuro: CN II-XII grossly intact; motor and sensory grossly intact    DATA AND STUDIES:  EKG: Performed today that I  reviewed demonstrates normal sinus rhythm  2D ECHO: January 2024  1. Left ventricular ejection fraction, by estimation, is 60 to 65%. The  left ventricle has normal function. The left ventricle has no regional  wall motion abnormalities. Left ventricular diastolic parameters are  consistent with Grade I diastolic  dysfunction (impaired relaxation).   2. Right ventricular systolic function is normal. The right ventricular  size is normal.   3. The mitral valve is normal in structure. Mild mitral valve  regurgitation. No evidence of mitral stenosis.   4. The aortic valve has an indeterminant number of cusps. There is severe  calcifcation of the aortic valve. There is severe thickening of the aortic  valve. Aortic valve regurgitation is mild to moderate. Moderate to severe  aortic valve stenosis. Aortic  regurgitation PHT measures 473 msec. Aortic valve area, by VTI measures  0.81 cm. Aortic valve mean gradient measures 27.8 mmHg. Aortic valve Vmax  measures 3.31 m/s.   5. The inferior vena cava is normal in size with greater than 50%  respiratory variability, suggesting right atrial pressure of 3 mmHg.   6. Compared to prior echo dated 09/06/2021, the mean AVG has increased  from 22 to 09/08/2021, Vmax increased from 3.02 to 3.31cm/s and AVA has  decreased from 1.03cm2 to 0.81cm2. The DVI has decreased from 0.37 to  0.29.   CARDIAC CATH: March 2023 with minimal obstructive coronary artery disease with a normal cardiac output and index with normal filling pressures  STS RISK CALCULATOR:  Procedure: Isolated AVR Risk of Mortality: 2.014% Renal Failure: 1.368% Permanent Stroke: 1.257% Prolonged Ventilation: 4.763% DSW Infection: 0.049% Reoperation: 3.505% Morbidity or Mortality: 7.955% Short Length of Stay: 41.866% Long Length of Stay: 3.176%  Euro Score: 1.63%  CCS angina class: 1  NHYA CLASS: 2-3    ASSESSMENT AND PLAN:   Moderate aortic stenosis - Plan: EKG  12-Lead, ECHOCARDIOGRAM COMPLETE  Scleroderma (HCC)  Sjogren's syndrome, with unspecified organ involvement (HCC)  Rheumatoid arthritis, involving unspecified site, unspecified whether rheumatoid factor present (HCC)  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  I think she has developed NYHA class II symptoms of dyspnea.  We had a long conversation about what to expect in the future.  She is not still not severe per her echocardiographic indices.  I will see her back in 2 months with another echocardiogram.  I have asked her to contact research personnel or my office if she were to develop worsening symptoms prior to that visit.  She understands and agrees with this plan.   Early Osmond, MD  03/11/2022 9:38 AM    [address], [place], [place]  [address] Phone: [phone]; Fax: [phone]

## 2022-03-11 NOTE — Telephone Encounter (Signed)
Hi Taishawn Smaldone, she's scheduled for her next echo and visit w Dr. Ali Lowe in March 13 and March 15.  :))  Thank you,  Caren Hazy

## 2022-03-11 NOTE — Patient Instructions (Addendum)
Medication Instructions:  No changes *If you need a refill on your cardiac medications before your next appointment, please call your pharmacy*   Lab Work: none   Testing/Procedures: ECHO DUE IN [facility]  Your physician has requested that you have an echocardiogram. Echocardiography is a painless test that uses sound waves to create images of your heart. It provides your doctor with information about the size and shape of your heart and how well your heart's chambers and valves are working. This procedure takes approximately one hour. There are no restrictions for this procedure. Please do NOT wear cologne, perfume, aftershave, or lotions (deodorant is allowed). Please arrive 15 minutes prior to your appointment time.    Follow-Up: 2 months with Dr. Ali Lowe with echocardiogram prior

## 2022-05-06 ENCOUNTER — Encounter: Payer: Self-pay | Admitting: Cardiology

## 2022-05-08 ENCOUNTER — Ambulatory Visit (HOSPITAL_COMMUNITY): Payer: Medicare Other | Attending: Cardiovascular Disease

## 2022-05-08 DIAGNOSIS — I350 Nonrheumatic aortic (valve) stenosis: Secondary | ICD-10-CM

## 2022-05-08 LAB — ECHOCARDIOGRAM COMPLETE
AR max vel: 0.91 cm2
AV Area VTI: 0.87 cm2
AV Area mean vel: 0.85 cm2
AV Mean grad: 24.2 mmHg
AV Peak grad: 39.1 mmHg
Ao pk vel: 3.13 m/s
Area-P 1/2: 5.84 cm2
P 1/2 time: 479 msec
S' Lateral: 3.7 cm

## 2022-05-09 NOTE — Progress Notes (Signed)
Patient ID: Regina Tate MRN: [id number] DOB/AGE: 75/14/1947 75 y.o.  Primary Care Physician:Pahwani, Michell Heinrich, MD Primary Cardiologist: Gwyndolyn Kaufman, MD   FOCUSED CARDIOVASCULAR PROBLEM LIST:   1.  Moderate aortic stenosis and aortic valve area 0.83 cm grade, mean gradient 26 mmHg, peak velocity of 3.3 m/s and dimensionless index of 0.33 with normal ejection fraction; EKG demonstrates no bundle-branch blocks; randomized to the clinical surveillance arm of PROGRESS trial; TTE March 2024 with ejection fraction of 45 to 50% with moderate aortic stenosis. 2.  Sjogren's syndrome 3.  Scleroderma 4.  Raynaud's syndrome 5.  Rheumatoid arthritis on Enbrel, methotrexate, and plaquenil   HISTORY OF PRESENT ILLNESS: Today  July 2023 visit:  The patient is seen for follow-up.  When I saw her last she was enrolled in the PROGRESS trial regarding moderate symptomatic aortic stenosis.  She was randomized to the clinical surveillance arm of the trial.  She was seen by Dr. Johney Frame a few months ago.  She reported increasing dyspnea on exertion.  She also reported possible TIA like symptoms.  Carotid ultrasound was negative.  Her monitor was reassuring and showed no atrial fibrillation.  She was seen by neurology and an MRI and MRI were reassuring.  Today she is relatively unchanged.  She can exercise for about 15 minutes and then needs to stop and take a rest.  This is about the same as before.  She denies any chest pain, palpitations, paroxysmal nocturnal dyspnea, orthopnea, presyncope or syncope.  She feels like her symptoms are relatively stable and not gotten worse whatsoever.  She is planning to go to [place] for a few weeks and to visit [employer].  Plan:  75 month follow up with TTE.  January 2024: Patient had an echocardiogram in the interim which demonstrated some progression of her aortic stenosis.  However she is still squarely in the moderate range, her stroke-volume index  is around 40 cc/m.  Yeah the patient tells me that she has developed a little bit more shortness of breath versus when I saw her last.  When she walks up the ramp to go to the call [place] or when she goes up to her seat at the [employer] she does feel short of breath.  She has had some chronic dizziness issues predating my last visit with her.  This would happen may be once or twice a week particularly in the morning.  It seems to be better if she drinks water or eats something.  Now they might happen may be 2 or 3 times.  She has had no frank syncope however.  They seem to get better when she drinks water.  She was counseled by her primary cardiologist to drink water in the morning before she gets up.  She fortunately has not required any emergency room visits or hospitalizations.  She went on a trip to [facility] in the interim since I saw her last and did fairly well there.  Plan: 75-month follow-up.  Today: In the interim the patient had an echocardiogram that I reviewed which demonstrated a decrease in left ventricular ejection fraction down to 45 to 50%.  She tells me she has been more short of breath of late.  She endorses NYHA class II symptoms of shortness of breath.  She has had some mild lightheadedness but no frank syncope.  She is also developed exertional left shoulder pain.  This does not happen at rest.  She fortunately has not recurred required any  emergency room visits or hospitalizations.  She did have a lot of dental work done in December and she tells me her teeth are in good condition.   Past Medical History:  Diagnosis Date   Allergic rhinitis    CREST syndrome (HCC)    Deep vein thrombosis (DVT) (HCC)    GERD (gastroesophageal reflux disease)    Hypercholesteremia    Hyperlipidemia    Osteopenia    RA (rheumatoid arthritis) ([place])    Raynaud's syndrome without gangrene    Scleroderma (HCC)    Sjogren's syndrome with keratoconjunctivitis sicca (HCC)    Vitamin D deficiency      Past Surgical History:  Procedure Laterality Date   RIGHT/LEFT HEART CATH AND CORONARY ANGIOGRAPHY N/A 04/25/2021   Procedure: RIGHT/LEFT HEART CATH AND CORONARY ANGIOGRAPHY;  Surgeon: Early Osmond, MD;  Location: [facility];  Service: Cardiovascular;  Laterality: N/A;    Family History  Problem Relation Age of Onset   Breast cancer Neg Hx     Social History   Socioeconomic History   Marital status: Divorced    Spouse name: Not on file   Number of children: Not on file   Years of education: Not on file   Highest education level: Not on file  Occupational History   Not on file  Tobacco Use   Smoking status: Never   Smokeless tobacco: Never  Substance and Sexual Activity   Alcohol use: Not on file   Drug use: Not on file   Sexual activity: Not on file  Other Topics Concern   Not on file  Social History Narrative   Not on file   Social Determinants of Health   Financial Resource Strain: Not on file  Food Insecurity: Not on file  Transportation Needs: Not on file  Physical Activity: Not on file  Stress: Not on file  Social Connections: Not on file  Intimate Partner Violence: Not on file     Prior to Admission medications   Medication Sig Start Date End Date Taking? Authorizing Provider  amLODipine (NORVASC) 5 MG tablet Take 5 mg by mouth daily. 11/12/16   [provider]  atorvastatin (LIPITOR) 20 MG tablet Take 1 tablet (20 mg total) by mouth daily. 12/25/20 03/25/21  Freada Bergeron, MD  Biotin 5 MG CAPS Take 1 capsule by mouth daily.    [provider]  Bromelains 500 MG TABS Take by mouth daily.    [provider]  CALCIUM PO Take 1 tablet by mouth daily.    [provider]  Cholecalciferol (VITAMIN D) 2000 units CAPS Take 1 capsule by mouth daily.    [provider]  ENBREL SURECLICK 50 MG/ML injection Inject 1 mL into the skin once a week. 09/24/16   [provider]  famotidine (PEPCID) 40 MG  tablet Take 40 mg by mouth 3 (three) times a week.    [provider]  Flaxseed, Linseed, (FLAX SEED OIL) 1300 MG CAPS Take 1 capsule by mouth daily.    [provider]  fluticasone (FLONASE) 50 MCG/ACT nasal spray Place 1 spray into both nostrils daily.    [provider]  folic acid (FOLVITE) 1 MG tablet Take 1 mg by mouth daily. 11/11/16   [provider]  hydroxychloroquine (PLAQUENIL) 200 MG tablet Take 2 tablets by mouth daily. 11/11/16   [provider]  methotrexate (RHEUMATREX) 2.5 MG tablet Take 8 tablets by mouth once a week. 11/13/16   [provider]  Multiple Vitamin (MULTIVITAMIN) tablet Take 1 tablet by mouth daily.    [provider]  Omega-3 Fatty Acids (FISH OIL) 1000 MG CAPS Take 1 capsule by mouth daily.    [provider]  omeprazole (PRILOSEC) 40 MG capsule Take 1 capsule by mouth 4 (four) times a week. 11/20/16   [provider]  pilocarpine (SALAGEN) 5 MG tablet Take 1 tablet by mouth 2 (two) times daily. 11/12/16   [provider]  Probiotic Product (PROBIOTIC PO) Take 1 tablet by mouth daily.    [provider]  Turmeric 450 MG CAPS Take 1 tablet by mouth daily.    [provider]    Allergies  Allergen Reactions   Codeine     Other reaction(s): makes pt the next day feel like she was drugged   Molds & Smuts     Itching eyes, congestion, headache   Pollen Extract Itching    Itching eyes, congestion, headache    REVIEW OF SYSTEMS:  General: no fevers/chills/night sweats Eyes: no blurry vision, diplopia, or amaurosis ENT: no sore throat or hearing loss Resp: no cough, wheezing, or hemoptysis CV: no edema or palpitations GI: no abdominal pain, nausea, vomiting, diarrhea, or constipation GU: no dysuria, frequency, or hematuria Skin: no rash Neuro: no headache, numbness, tingling, or weakness of extremities Musculoskeletal: no joint pain or swelling Heme: no  bleeding, DVT, or easy bruising Endo: no polydipsia or polyuria  BP 108/70   Pulse 81   Ht 5\' 4"  (1.626 m)   Wt 145 lb (65.8 kg)   SpO2 99%   BMI 24.89 kg/m   PHYSICAL EXAM: GEN:  AO x 3 in no acute distress HEENT: normal Dentition: Normal Neck: JVP normal. +2 carotid upstrokes without bruits. No thyromegaly. Lungs: equal expansion, clear bilaterally CV: Apex is discrete and nondisplaced, RRR with 3/6 SEM  Abd: soft, non-tender, non-distended; no bruit; positive bowel sounds Ext: no edema, ecchymoses, or cyanosis Vascular: 2+ femoral pulses, 2+ radial pulses       Skin: warm and dry without rash Neuro: CN II-XII grossly intact; motor and sensory grossly intact    DATA AND STUDIES:  EKG: Performed today that I reviewed demonstrates normal sinus rhythm  2D ECHO: January 2024  1. Left ventricular ejection fraction, by estimation, is 60 to 65%. The  left ventricle has normal function. The left ventricle has no regional  wall motion abnormalities. Left ventricular diastolic parameters are  consistent with Grade I diastolic  dysfunction (impaired relaxation).   2. Right ventricular systolic function is normal. The right ventricular  size is normal.   3. The mitral valve is normal in structure. Mild mitral valve  regurgitation. No evidence of mitral stenosis.   4. The aortic valve has an indeterminant number of cusps. There is severe  calcifcation of the aortic valve. There is severe thickening of the aortic  valve. Aortic valve regurgitation is mild to moderate. Moderate to severe  aortic valve stenosis. Aortic  regurgitation PHT measures 473 msec. Aortic valve area, by VTI measures  0.81 cm. Aortic valve mean gradient measures 27.8 mmHg. Aortic valve Vmax  measures 3.31 m/s.   5. The inferior vena cava is normal in size with greater than 50%  respiratory variability, suggesting right atrial pressure of 3 mmHg.   6. Compared to prior echo dated 09/06/2021, the mean AVG has  increased  from 22 to 69mmHg, Vmax increased from 3.02 to 3.31cm/s and AVA has  decreased from 1.03cm2  to 0.81cm2. The DVI has decreased from 0.37 to  0.29.   March 2024  1. Left ventricular ejection fraction, by estimation, is 45 to 50%. The  left ventricle has mildly decreased function. The left ventricle has no  regional wall motion abnormalities. Left ventricular diastolic parameters  are indeterminate. The average left   ventricular global longitudinal strain is -19.4 %. The global  longitudinal strain is normal.   2. Right ventricular systolic function is normal. The right ventricular  size is normal. There is normal pulmonary artery systolic pressure.   3. The mitral valve is normal in structure. Trivial mitral valve  regurgitation. No evidence of mitral stenosis.   4. The aortic valve is tricuspid. There is moderate calcification of the  aortic valve. There is moderate thickening of the aortic valve. Aortic  valve regurgitation is mild. Moderate aortic valve stenosis. Aortic  regurgitation PHT measures 479 msec.  Aortic valve area, by VTI measures 0.87 cm. Aortic valve mean gradient  measures 24.2 mmHg. Aortic valve Vmax measures 3.13 m/s.   5. The inferior vena cava is normal in size with greater than 50%  respiratory variability, suggesting right atrial pressure of 3 mmHg.   CARDIAC CATH: March 2023 with minimal obstructive coronary artery disease with a normal cardiac output and index with normal filling pressures  STS RISK CALCULATOR:  Procedure: Isolated AVR Risk of Mortality: 2.014% Renal Failure: 1.368% Permanent Stroke: 1.257% Prolonged Ventilation: 4.763% DSW Infection: 0.049% Reoperation: 3.505% Morbidity or Mortality: 7.955% Short Length of Stay: 41.866% Long Length of Stay: 3.176%  Euro Score: 1.63%  CCS angina class: 2  NHYA CLASS: 2    ASSESSMENT AND PLAN:   Moderate aortic stenosis  Scleroderma (HCC)  Sjogren's syndrome, with  unspecified organ involvement (HCC)  Rheumatoid arthritis, involving unspecified site, unspecified whether rheumatoid factor present ([place])  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  Her echocardiogram demonstrates a decrement in LV function.  Given this constellation of clinical signs and symptoms we will refer the patient for transcatheter valve replacement pending trial review.  Early Osmond, MD  05/10/2022 9:47 AM    [facility] [place], [place], [place]  [address] Phone: [phone]; Fax: [phone]

## 2022-05-10 ENCOUNTER — Ambulatory Visit: Payer: Medicare Other | Attending: Internal Medicine | Admitting: Internal Medicine

## 2022-05-10 ENCOUNTER — Encounter: Payer: Self-pay | Admitting: Internal Medicine

## 2022-05-10 VITALS — BP 108/70 | HR 81 | Ht 64.0 in | Wt 145.0 lb

## 2022-05-10 DIAGNOSIS — I350 Nonrheumatic aortic (valve) stenosis: Secondary | ICD-10-CM

## 2022-05-10 DIAGNOSIS — Z006 Encounter for examination for normal comparison and control in clinical research program: Secondary | ICD-10-CM

## 2022-05-10 DIAGNOSIS — M069 Rheumatoid arthritis, unspecified: Secondary | ICD-10-CM | POA: Diagnosis not present

## 2022-05-10 DIAGNOSIS — M3500 Sicca syndrome, unspecified: Secondary | ICD-10-CM

## 2022-05-10 DIAGNOSIS — M349 Systemic sclerosis, unspecified: Secondary | ICD-10-CM | POA: Diagnosis not present

## 2022-05-10 DIAGNOSIS — I7300 Raynaud's syndrome without gangrene: Secondary | ICD-10-CM

## 2022-05-10 DIAGNOSIS — M359 Systemic involvement of connective tissue, unspecified: Secondary | ICD-10-CM

## 2022-05-10 NOTE — Research (Signed)
Orders

## 2022-05-10 NOTE — Research (Signed)
Progress Trial  1 Year follow-up   STS RISK CALCULATOR:  Procedure: Isolated AVR Risk of Mortality: 2.014% Renal Failure: 1.368% Permanent Stroke: 1.257% Prolonged Ventilation: 4.763% DSW Infection: 0.049% Reoperation: 3.505% Morbidity or Mortality: 7.955% Short Length of Stay: 41.866% Long Length of Stay: 3.176%   Euro Score: 1.63%   CCS angina class: 2   NHYA CLASS: 2  NT Pro drawn  TTE done 05/08/2022  BP 108/70   Pulse 81   Ht 5\' 4"  (1.626 m)   Wt 145 lb (65.8 kg)   SpO2 99%   BMI 24.89 kg/m    PHYSICAL EXAM: GEN:  AO x 3 in no acute distress HEENT: normal Dentition: Normal Neck: JVP normal. +2 carotid upstrokes without bruits. No thyromegaly. Lungs: equal expansion, clear bilaterally CV: Apex is discrete and nondisplaced, RRR with 3/6 SEM  Abd: soft, non-tender, non-distended; no bruit; positive bowel sounds Ext: no edema, ecchymoses, or cyanosis Vascular: 2+ femoral pulses, 2+ radial pulses       Skin: warm and dry without rash Neuro: CN II-XII grossly intact; motor and sensory grossly intact    Medications:  Current Outpatient Medications:    amLODipine (NORVASC) 5 MG tablet, Take 5 mg by mouth daily., Disp: , Rfl:    aspirin EC 81 MG tablet, Take 1 tablet (81 mg total) by mouth daily. Swallow whole., Disp: 90 tablet, Rfl: 3   atorvastatin (LIPITOR) 20 MG tablet, Take 1 tablet (20 mg total) by mouth daily., Disp: 90 tablet, Rfl: 2   Biotin 5 MG CAPS, Take 5 mg by mouth daily., Disp: , Rfl:    CALCIUM PO, Take 1,200 mg by mouth 2 (two) times daily. Gummies, Disp: , Rfl:    Cholecalciferol (VITAMIN D) 2000 units CAPS, Take 2,000 Units by mouth daily., Disp: , Rfl:    Elderberry 500 MG CAPS, Take 500 mg by mouth daily., Disp: , Rfl:    ENBREL SURECLICK 50 MG/ML injection, Inject 50 mg into the skin once a week., Disp: , Rfl:    famotidine (PEPCID) 40 MG tablet, Take 40 mg by mouth 4 (four) times a week., Disp: , Rfl:    Flaxseed,  Linseed, (FLAX SEED OIL) 1000 MG CAPS, Take 1,000 mg by mouth daily., Disp: , Rfl:    fluticasone (FLONASE) 50 MCG/ACT nasal spray, Place 1 spray into both nostrils 2 (two) times daily., Disp: , Rfl:    folic acid (FOLVITE) 1 MG tablet, Take 1 mg by mouth daily., Disp: , Rfl:    hydroxychloroquine (PLAQUENIL) 200 MG tablet, Take 200 mg by mouth 2 (two) times daily., Disp: , Rfl:    loratadine (CLARITIN) 10 MG tablet, Take 10 mg by mouth daily as needed for allergies., Disp: , Rfl:    methotrexate (RHEUMATREX) 2.5 MG tablet, Take 10 mg by mouth 2 (two) times a week. Thursday and Friday, Disp: , Rfl:    Multiple Vitamin (MULTIVITAMIN) tablet, Take 1 tablet by mouth daily., Disp: , Rfl:    Omega-3 Fatty Acids (FISH OIL) 1000 MG CAPS, Take 2,000 mg by mouth daily., Disp: , Rfl:    omeprazole (PRILOSEC) 40 MG capsule, Take 40 mg by mouth 3 (three) times a week., Disp: , Rfl:    pilocarpine (SALAGEN) 5 MG tablet, Take 5 mg by mouth 2 (two) times daily., Disp: , Rfl:    Polyethyl Glycol-Propyl Glycol (SYSTANE) 0.4-0.3 % SOLN, Place 1 drop into both eyes daily as needed (lubricant)., Disp: , Rfl:    Probiotic  Product (PROBIOTIC PO), Take 1 capsule by mouth 2 (two) times a week., Disp: , Rfl:    Turmeric 450 MG CAPS, Take 450 mg by mouth 2 (two) times daily., Disp: , Rfl:    KCCQ: See worksheet   [id number]: See worksheet   [id number]:  See worksheet

## 2022-05-10 NOTE — Patient Instructions (Signed)
Medication Instructions:  Your physician recommends that you continue on your current medications as directed. Please refer to the Current Medication list given to you today.  *If you need a refill on your cardiac medications before your next appointment, please call your pharmacy*    Follow-Up: At [facility], you and your health needs are our priority.  As part of our continuing mission to provide you with exceptional heart care, we have created designated Provider Care Teams.  These Care Teams include your primary Cardiologist (physician) and Advanced Practice Providers (APPs -  Physician Assistants and Nurse Practitioners) who all work together to provide you with the care you need, when you need it.    Your next appointment:   As scheduled by the structural team

## 2022-05-10 NOTE — Addendum Note (Signed)
Addended by: Rubie Maid B on: 05/10/2022 10:34 AM   Modules accepted: Orders

## 2022-05-12 LAB — PRO B NATRIURETIC PEPTIDE: NT-Pro BNP: 233 pg/mL (ref 0–301)

## 2022-05-13 ENCOUNTER — Other Ambulatory Visit: Payer: Self-pay

## 2022-05-13 ENCOUNTER — Ambulatory Visit: Payer: Medicare Other | Attending: Cardiology

## 2022-05-13 DIAGNOSIS — I350 Nonrheumatic aortic (valve) stenosis: Secondary | ICD-10-CM

## 2022-05-13 DIAGNOSIS — Z006 Encounter for examination for normal comparison and control in clinical research program: Secondary | ICD-10-CM

## 2022-05-13 LAB — BASIC METABOLIC PANEL
BUN/Creatinine Ratio: 22 (ref 12–28)
BUN: 15 mg/dL (ref 8–27)
CO2: 29 mmol/L (ref 20–29)
Calcium: 9.6 mg/dL (ref 8.7–10.3)
Chloride: 100 mmol/L (ref 96–106)
Creatinine, Ser: 0.67 mg/dL (ref 0.57–1.00)
Glucose: 97 mg/dL (ref 70–99)
Potassium: 4.3 mmol/L (ref 3.5–5.2)
Sodium: 136 mmol/L (ref 134–144)
eGFR: 92 mL/min/{1.73_m2} (ref >59)

## 2022-05-13 MED ORDER — METOPROLOL TARTRATE 50 MG PO TABS: ORAL_TABLET | ORAL | 0 refills | Status: DC

## 2022-05-13 NOTE — Progress Notes (Signed)
Progress labs for review

## 2022-05-14 ENCOUNTER — Ambulatory Visit (HOSPITAL_COMMUNITY)
Admission: RE | Admit: 2022-05-14 | Discharge: 2022-05-14 | Disposition: A | Discharge status: Home / Self Care | Payer: Medicare Other | Source: Ambulatory Visit | Attending: Internal Medicine | Admitting: Internal Medicine

## 2022-05-14 DIAGNOSIS — I350 Nonrheumatic aortic (valve) stenosis: Secondary | ICD-10-CM

## 2022-05-14 DIAGNOSIS — Z006 Encounter for examination for normal comparison and control in clinical research program: Secondary | ICD-10-CM

## 2022-05-14 MED ORDER — IOHEXOL 350 MG/ML SOLN
100.0000 mL | Freq: Once | INTRAVENOUS | Status: AC | PRN
  Administered 2022-05-14: 100 mL via INTRAVENOUS

## 2022-05-15 ENCOUNTER — Other Ambulatory Visit (HOSPITAL_BASED_OUTPATIENT_CLINIC_OR_DEPARTMENT_OTHER): Payer: Medicare Other

## 2022-05-16 ENCOUNTER — Institutional Professional Consult (permissible substitution) (INDEPENDENT_AMBULATORY_CARE_PROVIDER_SITE_OTHER): Payer: Medicare Other | Admitting: Surgery

## 2022-05-16 ENCOUNTER — Encounter: Payer: Self-pay | Admitting: Surgery

## 2022-05-16 VITALS — BP 121/90 | HR 88 | Resp 20 | Ht 64.0 in | Wt 145.0 lb

## 2022-05-16 DIAGNOSIS — I350 Nonrheumatic aortic (valve) stenosis: Secondary | ICD-10-CM

## 2022-05-16 NOTE — Progress Notes (Signed)
Pre Surgical Assessment: 5 M Walk Test  80M=16.16ft  5 Meter Walk Test- trial 1: 3.83 seconds 5 Meter Walk Test- trial 2: 6.26 seconds 5 Meter Walk Test- trial 3: 6.15 seconds 5 Meter Walk Test Average: 5.42 seconds

## 2022-05-16 NOTE — Progress Notes (Signed)
Patient ID: Regina Tate, female   DOB: 1945-11-04, 77 y.o.   MRN: [id number]   [facility]       [address]       ,[place] [address]             [phone]          CARDIOTHORACIC SURGERY CONSULTATION REPORT  PCP is Pahwani, Michell Heinrich, MD Referring Provider is Lenna Sciara, MD Primary Cardiologist is Freada Bergeron, MD  Reason for consultation:  Severe aortic stenosis  HPI:  The patient is a 77 year old woman with a history of hyperlipidemia, rheumatoid arthritis, scleroderma, Sjogren's syndrome, Raynoud's syndrome, and moderate aortic stenosis who was enrolled in the PROGRESS trial and randomized to the clinical surveillance arm of the trial.  Her most recent echo on 05/08/2022 showed a trileaflet aortic valve with moderate calcification and thickening.  The mean gradient was measured at 24.2 mmHg with a valve area by VTI of 0.87 cm and mild aortic insufficiency.  Stroke-volume index was 39.  The left ventricular ejection fraction had decreased to 45 to 50% from her prior echo in January 2024 when it was 60 to 65%.  Her aortic valve mean gradient was measured at 22 mm Hg in July 2023 with a left ventricular ejection fraction of 55 to 60%.  She reports progression of symptoms since January 2024.  She has had worsening exertional shortness of breath and fatigue.  She has also noted repeated episodes of dizziness particularly when she is up on her feet for a while or bending over.  She denies syncope.  She has also had some episodes of left shoulder discomfort with exertion.  She denies orthopnea.  She sleeps on a couple pillows due to sinus drainage.  She has not noted any swelling in her legs although she said that her feet feel tight at the end of the day.  She is divorced and lives with a roommate who is a Pharmacist, hospital.  She remains active doing water aerobics, yoga, and Silver Sneakers.  Past Medical  History:  Diagnosis Date   Allergic rhinitis    CREST syndrome (HCC)    Deep vein thrombosis (DVT) (HCC)    GERD (gastroesophageal reflux disease)    Hypercholesteremia    Hyperlipidemia    Osteopenia    RA (rheumatoid arthritis) ([place])    Raynaud's syndrome without gangrene    Scleroderma (HCC)    Sjogren's syndrome with keratoconjunctivitis sicca (HCC)    Vitamin D deficiency     Past Surgical History:  Procedure Laterality Date   RIGHT/LEFT HEART CATH AND CORONARY ANGIOGRAPHY N/A 04/25/2021   Procedure: RIGHT/LEFT HEART CATH AND CORONARY ANGIOGRAPHY;  Surgeon: Early Osmond, MD;  Location: [facility];  Service: Cardiovascular;  Laterality: N/A;    Family History  Problem Relation Age of Onset   Breast cancer Neg Hx     Social History   Socioeconomic History   Marital status: Divorced    Spouse name: Not on file   Number of children: Not on file   Years of education: Not on file   Highest education level: Not on file  Occupational History   Not on file  Tobacco Use   Smoking status: Never   Smokeless tobacco: Never  Substance and Sexual Activity   Alcohol use: Not on file   Drug use: Not on file   Sexual activity: Not on file  Other  Topics Concern   Not on file  Social History Narrative   Not on file   Social Determinants of Health   Financial Resource Strain: Not on file  Food Insecurity: Not on file  Transportation Needs: Not on file  Physical Activity: Not on file  Stress: Not on file  Social Connections: Not on file  Intimate Partner Violence: Not on file    Prior to Admission medications   Medication Sig Start Date End Date Taking? Authorizing Provider  amLODipine (NORVASC) 5 MG tablet Take 5 mg by mouth daily. 11/12/16  Yes [provider]  aspirin EC 81 MG tablet Take 1 tablet (81 mg total) by mouth daily. Swallow whole. 06/12/21  Yes Freada Bergeron, MD  atorvastatin (LIPITOR) 20 MG tablet Take 1 tablet (20 mg total) by  mouth daily. 12/07/21  Yes Freada Bergeron, MD  Biotin 5 MG CAPS Take 5 mg by mouth daily.   Yes [provider]  CALCIUM PO Take 1,200 mg by mouth 2 (two) times daily. Gummies   Yes [provider]  Cholecalciferol (VITAMIN D) 2000 units CAPS Take 2,000 Units by mouth daily.   Yes [provider]  Elderberry 500 MG CAPS Take 500 mg by mouth daily. 12/26/21  Yes [provider]  ENBREL SURECLICK 50 MG/ML injection Inject 50 mg into the skin once a week. 09/24/16  Yes [provider]  famotidine (PEPCID) 40 MG tablet Take 40 mg by mouth 4 (four) times a week.   Yes [provider]  Flaxseed, Linseed, (FLAX SEED OIL) 1000 MG CAPS Take 1,000 mg by mouth daily.   Yes [provider]  fluticasone (FLONASE) 50 MCG/ACT nasal spray Place 1 spray into both nostrils 2 (two) times daily.   Yes [provider]  folic acid (FOLVITE) 1 MG tablet Take 1 mg by mouth daily. 11/11/16  Yes [provider]  hydroxychloroquine (PLAQUENIL) 200 MG tablet Take 200 mg by mouth 2 (two) times daily. 11/11/16  Yes [provider]  loratadine (CLARITIN) 10 MG tablet Take 10 mg by mouth daily as needed for allergies.   Yes [provider]  methotrexate (RHEUMATREX) 2.5 MG tablet Take 10 mg by mouth 2 (two) times a week. Thursday and Friday 11/13/16  Yes [provider]  metoprolol tartrate (LOPRESSOR) 50 MG tablet Take as directed prior to 3/19 CT scan 05/13/22  Yes Early Osmond, MD  Multiple Vitamin (MULTIVITAMIN) tablet Take 1 tablet by mouth daily.   Yes [provider]  Omega-3 Fatty Acids (FISH OIL) 1000 MG CAPS Take 2,000 mg by mouth daily.   Yes [provider]  omeprazole (PRILOSEC) 40 MG capsule Take 40 mg by mouth 3 (three) times a week. 11/20/16  Yes [provider]  pilocarpine (SALAGEN) 5 MG tablet Take 5 mg by mouth 2 (two) times daily. 11/12/16  Yes [provider]   Polyethyl Glycol-Propyl Glycol (SYSTANE) 0.4-0.3 % SOLN Place 1 drop into both eyes daily as needed (lubricant).   Yes [provider]  Probiotic Product (PROBIOTIC PO) Take 1 capsule by mouth 2 (two) times a week.   Yes [provider]  Turmeric 450 MG CAPS Take 450 mg by mouth 2 (two) times daily.   Yes [provider]    Current Outpatient Medications  Medication Sig Dispense Refill   amLODipine (NORVASC) 5 MG tablet Take 5 mg by mouth daily.     aspirin EC 81 MG tablet Take 1 tablet (  81 mg total) by mouth daily. Swallow whole. 90 tablet 3   atorvastatin (LIPITOR) 20 MG tablet Take 1 tablet (20 mg total) by mouth daily. 90 tablet 2   Biotin 5 MG CAPS Take 5 mg by mouth daily.     CALCIUM PO Take 1,200 mg by mouth 2 (two) times daily. Gummies     Cholecalciferol (VITAMIN D) 2000 units CAPS Take 2,000 Units by mouth daily.     Elderberry 500 MG CAPS Take 500 mg by mouth daily.     ENBREL SURECLICK 50 MG/ML injection Inject 50 mg into the skin once a week.     famotidine (PEPCID) 40 MG tablet Take 40 mg by mouth 4 (four) times a week.     Flaxseed, Linseed, (FLAX SEED OIL) 1000 MG CAPS Take 1,000 mg by mouth daily.     fluticasone (FLONASE) 50 MCG/ACT nasal spray Place 1 spray into both nostrils 2 (two) times daily.     folic acid (FOLVITE) 1 MG tablet Take 1 mg by mouth daily.     hydroxychloroquine (PLAQUENIL) 200 MG tablet Take 200 mg by mouth 2 (two) times daily.     loratadine (CLARITIN) 10 MG tablet Take 10 mg by mouth daily as needed for allergies.     methotrexate (RHEUMATREX) 2.5 MG tablet Take 10 mg by mouth 2 (two) times a week. Thursday and Friday     metoprolol tartrate (LOPRESSOR) 50 MG tablet Take as directed prior to 3/19 CT scan 1 tablet 0   Multiple Vitamin (MULTIVITAMIN) tablet Take 1 tablet by mouth daily.     Omega-3 Fatty Acids (FISH OIL) 1000 MG CAPS Take 2,000 mg by mouth daily.     omeprazole (PRILOSEC) 40 MG capsule Take 40 mg by mouth 3  (three) times a week.     pilocarpine (SALAGEN) 5 MG tablet Take 5 mg by mouth 2 (two) times daily.     Polyethyl Glycol-Propyl Glycol (SYSTANE) 0.4-0.3 % SOLN Place 1 drop into both eyes daily as needed (lubricant).     Probiotic Product (PROBIOTIC PO) Take 1 capsule by mouth 2 (two) times a week.     Turmeric 450 MG CAPS Take 450 mg by mouth 2 (two) times daily.     No current facility-administered medications for this visit.    Allergies  Allergen Reactions   Codeine     Other reaction(s): makes pt the next day feel like she was drugged   Molds & Smuts     Itching eyes, congestion, headache   Pollen Extract Itching    Itching eyes, congestion, headache      Review of Systems:   General:  normal appetite, + decreased energy, no weight gain, no weight loss, no fever  Cardiac:  no chest pain with exertion but has had left shoulder pain, no chest pain at rest, +SOB with moderate exertion, no resting SOB, no PND, no orthopnea, no palpitations, no arrhythmia, no atrial fibrillation, no LE edema, + dizzy spells, no syncope  Respiratory:  + exertional shortness of breath, no home oxygen, no productive cough, no dry cough, no bronchitis, no wheezing, no hemoptysis, no asthma, no pain with inspiration or cough, no sleep apnea, no CPAP at night  GI:   no difficulty swallowing, no reflux, no frequent heartburn, no hiatal hernia, no abdominal pain, no constipation, no diarrhea, no hematochezia, no hematemesis, no melena  GU:   no dysuria,  no frequency, no urinary tract infection, no hematuria,  no kidney stones,  no kidney disease  Vascular:  no pain suggestive of claudication, no pain in feet, no leg cramps, no varicose veins, no DVT, no non-healing foot ulcer  Neuro:   no stroke, no TIA's, no seizures, no headaches, no temporary blindness one eye,  no slurred speech, no peripheral neuropathy, + chronic pain, no instability of gait, no memory/cognitive dysfunction  Musculoskeletal: + rheumatoid  arthritis on Enbrel, methotrexate, and plaquenil  , + joint swelling, no myalgias, no difficulty walking, normal mobility   Skin:   no rash, no itching, no skin infections, no pressure sores or ulcerations  Psych:   no anxiety, n depression, ono nervousness, no unusual recent stress  Eyes:   no blurry vision, no floaters, no recent vision changes, + wears glasses  ENT:   no hearing loss, no loose or painful teeth, no dentures, sees dentis regularly  Hematologic:  no easy bruising, no abnormal bleeding, no clotting disorder, no frequent epistaxis  Endocrine:  no diabetes, does not check CBG's at home     Physical Exam:   BP (!) 121/90 (BP Location: Right Arm, Patient Position: Sitting, Cuff Size: Normal)   Pulse 88   Resp 20   Ht 5\' 4"  (1.626 m)   Wt 145 lb (65.8 kg)   SpO2 99% Comment: RA  BMI 24.89 kg/m   General:  well-appearing  HEENT:  Unremarkable, NCAT, PERLA, EOMI  Neck:   no JVD, no bruits, no adenopathy   Chest:   clear to auscultation, symmetrical breath sounds, no wheezes, no rhonchi   CV:   RRR, 3/6 systolic murmur RSB, no diastolic murmur  Abdomen:  soft, non-tender, no masses   Extremities:  warm, well-perfused, pedal pulses palpable, no lower extremity edema  Rectal/GU  Deferred  Neuro:   Grossly non-focal and symmetrical throughout  Skin:   Clean and dry, no rashes, no breakdown  Diagnostic Tests:  ECHOCARDIOGRAM REPORT       Patient Name:   Regina Tate Date of Exam: 05/08/2022  Medical Rec #:  [id number]                 Height:       64.0 in  Accession #:    [id number]                Weight:       146.8 lb  Date of Birth:  12-07-1945                  BSA:          1.715 m  Patient Age:    83 years                  BP:           138/77 mmHg  Patient Gender: F                         HR:           76 bpm.  Exam Location:  [address]   Procedure: 2D Echo, Cardiac Doppler, Color Doppler and Strain Analysis   Indications:    I35.0 Aortic  Stenosis    History:        Patient has prior history of Echocardiogram examinations,  most                 recent 03/18/2022. Risk Factors:HLD. No cardiac history.    Sonographer:    Marygrace Drought  RCS  Referring Phys: [id number] [place]     1. Left ventricular ejection fraction, by estimation, is 45 to 50%. The  left ventricle has mildly decreased function. The left ventricle has no  regional wall motion abnormalities. Left ventricular diastolic parameters  are indeterminate. The average left   ventricular global longitudinal strain is -19.4 %. The global  longitudinal strain is normal.   2. Right ventricular systolic function is normal. The right ventricular  size is normal. There is normal pulmonary artery systolic pressure.   3. The mitral valve is normal in structure. Trivial mitral valve  regurgitation. No evidence of mitral stenosis.   4. The aortic valve is tricuspid. There is moderate calcification of the  aortic valve. There is moderate thickening of the aortic valve. Aortic  valve regurgitation is mild. Moderate aortic valve stenosis. Aortic  regurgitation PHT measures 479 msec.  Aortic valve area, by VTI measures 0.87 cm. Aortic valve mean gradient  measures 24.2 mmHg. Aortic valve Vmax measures 3.13 m/s.   5. The inferior vena cava is normal in size with greater than 50%  respiratory variability, suggesting right atrial pressure of 3 mmHg.   FINDINGS   Left Ventricle: Left ventricular ejection fraction, by estimation, is 45  to 50%. The left ventricle has mildly decreased function. The left  ventricle has no regional wall motion abnormalities. The average left  ventricular global longitudinal strain is  -19.4 %. The global longitudinal strain is normal. The left ventricular  internal cavity size was normal in size. There is no left ventricular  hypertrophy. Left ventricular diastolic parameters are indeterminate.   Right Ventricle: The right  ventricular size is normal. No increase in  right ventricular wall thickness. Right ventricular systolic function is  normal. There is normal pulmonary artery systolic pressure. The tricuspid  regurgitant velocity is 1.37 m/s, and   with an assumed right atrial pressure of 3 mmHg, the estimated right  ventricular systolic pressure is [id number] mmHg.   Left Atrium: Left atrial size was normal in size.   Right Atrium: Right atrial size was normal in size.   Pericardium: There is no evidence of pericardial effusion.   Mitral Valve: The mitral valve is normal in structure. Trivial mitral  valve regurgitation. No evidence of mitral valve stenosis.   Tricuspid Valve: The tricuspid valve is normal in structure. Tricuspid  valve regurgitation is trivial. No evidence of tricuspid stenosis.   Aortic Valve: The aortic valve is tricuspid. There is moderate  calcification of the aortic valve. There is moderate thickening of the  aortic valve. Aortic valve regurgitation is mild. Aortic regurgitation PHT  measures 479 msec. Moderate aortic stenosis  is present. Aortic valve mean gradient measures 24.2 mmHg. Aortic valve  peak gradient measures 39.1 mmHg. Aortic valve area, by VTI measures 0.87  cm.   Pulmonic Valve: The pulmonic valve was normal in structure. Pulmonic valve  regurgitation is trivial. No evidence of pulmonic stenosis.   Aorta: The aortic root is normal in size and structure.   Venous: The inferior vena cava is normal in size with greater than 50%  respiratory variability, suggesting right atrial pressure of 3 mmHg.   IAS/Shunts: No atrial level shunt detected by color flow Doppler.     LEFT VENTRICLE  PLAX 2D  LVIDd:         4.80 cm   Diastology  LVIDs:         3.70 cm   LV e' medial:  6.20 cm/s  LV PW:         0.80 cm   LV E/e' medial:  11.0  LV IVS:        0.90 cm   LV e' lateral:   10.00 cm/s  LVOT diam:     1.90 cm   LV E/e' lateral: 6.8  LV SV:         66  LV SV  Index:   39        2D Longitudinal Strain  LVOT Area:     2.84 cm  2D Strain GLS (A2C):   -22.1 %                           2D Strain GLS (A3C):   -18.0 %                           2D Strain GLS (A4C):   -18.1 %                           2D Strain GLS Avg:     -19.4 %   RIGHT VENTRICLE  RV Basal diam:  3.20 cm  RV S prime:     10.30 cm/s  TAPSE (M-mode): 1.8 cm  RVSP:           10.5 mmHg   LEFT ATRIUM             Index        RIGHT ATRIUM           Index  LA diam:        3.40 cm 1.98 cm/m   RA Pressure: 3.00 mmHg  LA Vol (A2C):   50.7 ml 29.56 ml/m  RA Area:     12.20 cm  LA Vol (A4C):   31.5 ml 18.36 ml/m  RA Volume:   27.30 ml  15.91 ml/m  LA Biplane Vol: 43.3 ml 25.24 ml/m   AORTIC VALVE  AV Area (Vmax):    0.91 cm  AV Area (Vmean):   0.85 cm  AV Area (VTI):     0.87 cm  AV Vmax:           312.60 cm/s  AV Vmean:          232.800 cm/s  AV VTI:            0.765 m  AV Peak Grad:      39.1 mmHg  AV Mean Grad:      24.2 mmHg  LVOT Vmax:         100.50 cm/s  LVOT Vmean:        70.150 cm/s  LVOT VTI:          0.234 m  LVOT/AV VTI ratio: 0.31  AI PHT:            479 msec    AORTA  Ao Root diam: 3.20 cm  Ao Asc diam:  3.50 cm   MITRAL VALVE               TRICUSPID VALVE  MV Area (PHT):             TR Peak grad:   7.5 mmHg  MV Decel Time:             TR Vmax:        137.00 cm/s  MV E velocity: 68.10 cm/s  Estimated RAP:  3.00 mmHg  MV A velocity: 73.40 cm/s  RVSP:           10.5 mmHg  MV E/A ratio:  0.93                             SHUNTS                             Systemic VTI:  0.23 m                             Systemic Diam: 1.90 cm   Skeet Latch MD  Electronically signed by Skeet Latch MD  Signature Date/Time: 05/08/2022/5:12:14 PM        Final      Physicians  Panel Physicians Referring Physician Case Authorizing Physician  Early Osmond, MD (Primary)     Procedures  RIGHT/LEFT HEART CATH AND CORONARY ANGIOGRAPHY   Conclusion       LV end diastolic pressure is normal.   The left ventricular ejection fraction is 55-65% by visual estimate.   1.  Mild obstructive coronary artery disease 2.  Cardiac output of 8.6 L/min and cardiac index of 5.0 L/min/m. 3.  RA pressure of 8/4 with a mean of 3 mmHg, wedge pressure of 8/6 with a mean of 4 mmHg, pulmonary artery pressure 24/7, and papi of greater than 5.   Indications  Chronic diastolic heart failure (HCC) [I50.32 (ICD-10-CM)]   Clinical Presentation  CHF/Shock Congestive heart failure not present. No shock present.   Procedural Details  Technical Details The patient is a 77 year old female with a history of moderate symptomatic aortic stenosis, Sjogren's disease, scleroderma, Raynaud's disease, and rheumatoid arthritis who was evaluated for dyspnea.  She is referred for right heart catheterization, coronary angiography study, and left heart catheterization for this indication as well as possible inclusion in the PROGRESS trial.  After obtaining consent the patient was brought to the cardiac catheterization laboratory prepped draped sterile fashion ultrasound was used to gain access to the right radial artery and a 6 French glide sheath placed there.  5mg  of verapamil and 5000 units of heparin were administered through the sheath.  A previously placed antecubital IV was exchanged for a 5 French femoral glide sheath.  Right heart catheterization, coronary angiography, and left heart catheterization study was then performed.  At the conclusion of the procedure manual pressure was applied to the antecubital site and a TR band was placed on the radial site.  Estimated blood loss <50 mL.   During this procedure medications were administered to achieve and maintain moderate conscious sedation while the patient's heart rate, blood pressure, and oxygen saturation were continuously monitored and I was present face-to-face 100% of this time.   Medications (Filter: Administrations  occurring from [id number] to 0837 on 04/25/21)  important  Continuous medications are totaled by the amount administered until 04/25/21 0837.   Heparin (Porcine) in NaCl 1000-0.9 UT/500ML-% SOLN (mL)  Total volume: 1,000 mL Date/Time Rate/Dose/Volume Action   04/25/21 0751 500 mL Given   0751 500 mL Given   fentaNYL (SUBLIMAZE) injection (mcg)  Total dose: 25 mcg Date/Time Rate/Dose/Volume Action   04/25/21 0802 25 mcg Given   midazolam (VERSED) injection (mg)  Total dose: 1 mg Date/Time Rate/Dose/Volume Action   04/25/21 0802 1  mg Given   lidocaine (PF) (XYLOCAINE) 1 % injection (mL)  Total volume: 4 mL Date/Time Rate/Dose/Volume Action   04/25/21 0804 2 mL Given   0806 2 mL Given   Radial Cocktail/Verapamil only  Total dose: Cannot be calculated* *Administration dose not documented Date/Time Rate/Dose/Volume Action   04/25/21 0806  Given   Radial Cocktail (Verapamil 5 mg, NTG, Lidocaine) (mL)  Total volume: 10 mL Date/Time Rate/Dose/Volume Action   04/25/21 0806 10 mL Given   heparin sodium (porcine) injection (Units)  Total dose: 5,000 Units Date/Time Rate/Dose/Volume Action   04/25/21 0807 5,000 Units Given    Sedation Time  Sedation Time Physician-1: 19 minutes 58 seconds Radiation/Fluoro  Fluoro time: 3.4 (min) DAP: 7789 (mGycm2) Cumulative Air Kerma: [id number] (mGy) Complications  Complications documented before study signed (04/25/2021  8:37 AM)   RIGHT/LEFT HEART CATH AND CORONARY ANGIOGRAPHY  None Documented by Early Osmond, MD 04/25/2021  8:31 AM  Date Found: 04/25/2021  Time Range: Intraprocedure       Coronary Findings  Diagnostic Dominance: Right Left Anterior Descending  There is mild diffuse disease throughout the vessel.    First Diagonal Branch  The vessel exhibits minimal luminal irregularities.    Right Coronary Artery  The vessel exhibits minimal luminal irregularities.    Intervention   No interventions have been documented.   Wall  Motion     The following segments are normal: mid anterior, mid inferior, basilar anterior, basilar inferior, apical anterior and apical inferior.           Left Heart  Left Ventricle LV end diastolic pressure is normal. The left ventricular ejection fraction is 55-65% by visual estimate.   Coronary Diagrams  Diagnostic Dominance: Right  Intervention   Implants   No implant documentation for this case.   Syngo Images   Show images for CARDIAC CATHETERIZATION Images on Long Term Storage   Show images for Carron, Nordman to Procedure Log  Procedure Log    Hemo Data  Flowsheet Row Most Recent Value  Fick Cardiac Output 8.66 L/min  Fick Cardiac Output Index 5.01 (L/min)/BSA  Aortic Mean Gradient 15.46 mmHg  Aortic Peak Gradient 10.3 mmHg  Aortic Valve Area 2.34  Aortic Value Area Index 1.36 cm2/BSA  RA A Wave 8 mmHg  RA V Wave 4 mmHg  RA Mean 3 mmHg  RV Systolic Pressure 31 mmHg  RV Diastolic Pressure 0 mmHg  RV EDP 3 mmHg  PA Systolic Pressure 24 mmHg  PA Diastolic Pressure 7 mmHg  PA Mean 14 mmHg  PW A Wave 8 mmHg  PW V Wave 6 mmHg  PW Mean 4 mmHg  AO Systolic Pressure [id number] mmHg  AO Diastolic Pressure 71 mmHg  AO Mean 88 mmHg  LV Systolic Pressure [id number] mmHg  LV Diastolic Pressure 1 mmHg  LV EDP 9 mmHg  AOp Systolic Pressure 123456 mmHg  AOp Diastolic Pressure 59 mmHg  AOp Mean Pressure 84 mmHg  LVp Systolic Pressure [id number] mmHg  LVp Diastolic Pressure 18 mmHg  LVp EDP Pressure 23 mmHg  QP/QS 0.89  TPVR Index 3.14 HRUI  TSVR Index 17.57 HRUI  PVR SVR Ratio 0.13  TPVR/TSVR Ratio 0.18    ADDENDUM REPORT: 05/14/2022 18:41   CLINICAL DATA:  Aortic Valve pathology with assessment for TAVR   EXAM: Cardiac TAVR CT   TECHNIQUE: The patient was scanned on a Siemens Force [id number] slice scanner. A 120 kV retrospective scan was triggered in the descending thoracic aorta at  111 HU's. Gantry rotation speed was 270 msecs and collimation was .9 mm.  No beta blockade or nitro were given. The 3D data set was reconstructed in 5% intervals of the R-R cycle. Systolic and diastolic phases were analyzed on a dedicated work station using MPR, MIP and VRT modes. The patient received 100 cc of contrast.   FINDINGS: Aortic Valve: Isolated calcification and thickening of the non coronary cusp leaflet of the aortic valve with reduced excursion of this leaflet; the planimeter valve area is 1.30 Sq cm consistent with moderate aortic stenosis   Number of leaflets: Three   LVOT calcification: None   Annular calcification: None   Aortic Valve Calcium Score: 1007   Perimembranous septal diameter: 5 mm   Mitral Valve: No calcifications   Aortic Annulus Measurements- Systole 30 %   Major annulus diameter: 26 mm   Minor annulus diameter: 21 mm   Annular perimeter: 72 mm   Annular area: 3.88 cm2   LVOT area: 3.68 cm2   Aortic Annulus Measurements- Diastole 70%   Major annulus diameter: 26 mm   Minor annulus diameter: 18 mm   Annular perimeter: 69 mm   Annular area: 3.56 cm2   LVOT area: 3.47 cm2   Aortic Root Measurements   Sinotubular Junction: 28 mm   Ascending Thoracic Aorta: 35 mm   Aortic Arch: 26 mm   Descending Thoracic Aorta: 23 mm   Sinus of Valsalva Measurements:   Right coronary cusp width: 28 mm   Left coronary cusp width: 28 mm   Non coronary cusp width: 28 mm   Coronary Artery Height above Annulus:   Left Main: 15 mm   Left SoV height: 19 mm   Right Coronary: 15 mm   Right SoV height: 19 mm   Non SoV height: 19 mm   Optimum Fluoroscopic Angle for Delivery: LAO 17 CAU 10   Valves for structural team consideration: 23 mm Sapien Valve   Non TAVR Valve Findings:   Coronary Arteries: Normal coronary origin. Study not completed with nitroglycerin.   Coronary Calcium Score:   Left main: 0   Left anterior descending artery: 310   Left circumflex artery: 41   Right coronary artery: 77    Total: 428   Percentile: 85th for age, sex, and race matched control.   Systemic veins: Normal anatomy   Main Pulmonary artery: Normal caliber   Pulmonary veins: Normal anatomy   Left atrial appendage: Patent   Interatrial septum: No clear communications   Left ventricle: Normal size   Left atrium: Normal size   Right ventricle: Normal size   Right atrium: Normal size   Pericardium: No calcifications   Extra Cardiac Findings as per separate reporting.   Notable artifacts: None   IMPRESSION: 1. Moderate Aortic stenosis. Findings pertinent to TAVR procedure are detailed above.   2. Similar valve findings from 2023 study. No change in valve sizing recommendation.   RECOMMENDATIONS:   The proposed cut-off value of 1,651 AU yielded a 93 % sensitivity and 75 % specificity in grading AS severity in patients with classical low-flow, low-gradient AS. Proposed different cut-off values to define severe AS for men and women as 2,065 AU and 1,274 AU, respectively. The joint European and American recommendations for the assessment of AS consider the aortic valve calcium score as a continuum - a very high calcium score suggests severe AS and a low calcium score suggests severe AS is unlikely.   Kerman Passey,  et al. 2017 ESC/EACTS Guidelines for the management of valvular heart disease. Eur Heart J [id number]   Coronary artery calcium (CAC) score is a strong predictor of incident coronary heart disease (CHD) and provides predictive information beyond traditional risk factors. CAC scoring is reasonable to use in the decision to withhold, postpone, or initiate statin therapy in intermediate-risk or selected borderline-risk asymptomatic adults (age 4-75 years and LDL-C >=70 to <190 mg/dL) who do not have diabetes or established atherosclerotic cardiovascular disease (ASCVD).* In intermediate-risk (10-year ASCVD risk >=7.5% to <20%) adults or selected  borderline-risk (10-year ASCVD risk >=5% to <7.5%) adults in whom a CAC score is measured for the purpose of making a treatment decision the following recommendations have been made:   If CAC = 0, it is reasonable to withhold statin therapy and reassess in 5 to 10 years, as long as higher risk conditions are absent (diabetes mellitus, family history of premature CHD in first degree relatives (males <55 years; females <65 years), cigarette smoking, LDL >=190 mg/dL or other independent risk factors).   If CAC is 1 to 99, it is reasonable to initiate statin therapy for patients >=21 years of age.   If CAC is >=100 or >=75th percentile, it is reasonable to initiate statin therapy at any age.   Cardiology referral should be considered for patients with CAC scores >=400 or >=75th percentile.   *2018 AHA/ACC/AACVPR/AAPA/ABC/ACPM/ADA/AGS/APhA/ASPC/NLA/PCNA Guideline on the Management of Blood Cholesterol: A Report of the American College of Cardiology/American Heart Association Task Force on Clinical Practice Guidelines. J Am Coll Cardiol. 2019;73(24):3168-3209.   Mahesh  Chandrasekhar     Electronically Signed   By: Rudean Haskell M.D.   On: 05/14/2022 18:41        Narrative & Impression  CLINICAL DATA:  Preop evaluation for aortic valve replacement   EXAM: CT ANGIOGRAPHY CHEST, ABDOMEN AND PELVIS   TECHNIQUE: Non-contrast CT of the chest was initially obtained.   Multidetector CT imaging through the chest, abdomen and pelvis was performed using the standard protocol during bolus administration of intravenous contrast. Multiplanar reconstructed images and MIPs were obtained and reviewed to evaluate the vascular anatomy.   RADIATION DOSE REDUCTION: This exam was performed according to the departmental dose-optimization program which includes automated exposure control, adjustment of the mA and/or kV according to patient size and/or use of iterative reconstruction  technique.   CONTRAST:  125mL OMNIPAQUE IOHEXOL 350 MG/ML SOLN   COMPARISON:  None Available.   FINDINGS: CTA CHEST FINDINGS   Cardiovascular: Normal heart size. Pericardial effusion. Aortic valve thickening and calcifications. Normal caliber thoracic aorta with mild atherosclerotic disease standard three-vessel aortic arch with no significant stenosis. Three vessel coronary artery disease.   Mediastinum/Nodes: Esophagus and thyroid unremarkable. No pathologically enlarged lymph nodes seen in the chest.   Lungs/Pleura: Central airways are patent. No consolidation, pleural effusion or pneumothorax.   Musculoskeletal: No chest wall abnormality. No acute or significant osseous findings.   CTA ABDOMEN AND PELVIS FINDINGS   Hepatobiliary: No focal liver abnormality is seen. No gallstones, gallbladder wall thickening, or biliary dilatation.   Pancreas: Unremarkable. No pancreatic ductal dilatation or surrounding inflammatory changes.   Spleen: Normal in size without focal abnormality.   Adrenals/Urinary Tract: Adrenal glands are unremarkable. Kidneys are normal, without renal calculi, focal lesion, or hydronephrosis. Bladder is unremarkable.   Stomach/Bowel: Stomach is within normal limits. Appendix appears normal. No evidence of bowel wall thickening, distention, or inflammatory changes.   Vascular/lymphatic: Normal caliber abdominal aorta with mild  atherosclerotic disease. Significant stenosis. No enlarged lymph nodes seen in the abdomen or pelvis.   Reproductive: Uterus and bilateral adnexa are unremarkable.   Other: No abdominal wall hernia or abnormality. No abdominopelvic ascites.   Musculoskeletal: No acute or significant osseous findings.   VASCULAR MEASUREMENTS PERTINENT TO TAVR:   AORTA:   Minimal Aortic Diameter -  14.1 mm   Severity of Aortic Calcification-mild   RIGHT PELVIS:   Right Common Iliac Artery -   Minimal Diameter-8.0 mm    Tortuosity-mild   Calcification-moderate   Right External Iliac Artery -   Minimal Diameter-7.4 mm   Tortuosity-moderate   Calcification-none   Right Common Femoral Artery -   Minimal Diameter-5.8 mm   Tortuosity-none   Calcification-mild   LEFT PELVIS:   Left Common Iliac Artery -   Minimal Diameter-7.4 mm   Tortuosity-mild   Calcification-moderate   Left External Iliac Artery -   Minimal Diameter-6.8 mm   Tortuosity-mild   Calcification-none   Left Common Femoral Artery -   Minimal Diameter-6.1 mm   Tortuosity-none   Calcification-moderate   Review of the MIP images confirms the above findings.   IMPRESSION: 1. Vascular findings and measurements pertinent to potential TAVR procedure, as detailed above. 2. Thickening and calcification of the aortic valve, compatible with reported clinical history of aortic stenosis. 3. 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 Three vessel coronary artery disease.     Electronically Signed   By: Yetta Glassman M.D.   On: 05/14/2022 12:26      Impression:  This 77 year old woman has a history of moderate aortic stenosis in the surveillance arm of the PROGRESS trial and has now developed NYHA class II symptoms of exertional fatigue and shortness of breath as well as exertional dizziness and left shoulder discomfort.  She has had a decrease in her left ventricular ejection fraction to 45 to 50% with a stable mean aortic valve gradient of 24.2 mmHg and a valve area by VTI of 0.87 cm.  Her valve looks like a severely stenotic valve on echocardiogram.  Her progression of symptoms and reduced ejection fraction would suggest development of severe low-flow/low gradient aortic stenosis.  I agree that aortic valve replacement is indicated in this patient for relief of her symptoms and to prevent further left ventricular deterioration.  Her gated cardiac CTA shows anatomy suitable for TAVR using a 23 mm SAPIEN 3 valve.   Her abdominal and pelvic CTA shows adequate pelvic vascular access to allow transfemoral insertion.  She has been scheduled for a dobutamine stress echocardiogram on 05/28/2022 to document progression to severe aortic stenosis.  The patient was counseled at length regarding treatment alternatives for management of severe symptomatic aortic stenosis. The risks and benefits of surgical intervention has been discussed in detail. Long-term prognosis with medical therapy was discussed. Alternative approaches such as conventional surgical aortic valve replacement, transcatheter aortic valve replacement, and palliative medical therapy were compared and contrasted at length. This discussion was placed in the context of the patient's own specific clinical presentation and past medical history. All of her questions have been addressed.   Following the decision to proceed with transcatheter aortic valve replacement, a discussion was held regarding what types of management strategies would be attempted intraoperatively in the event of life-threatening complications, including whether or not the patient would be considered a candidate for the use of cardiopulmonary bypass and/or conversion to open sternotomy for attempted surgical intervention.  She is only 80 and still physically active and exercising several days  per week and I think she would be a candidate for emergent sternotomy to manage any intraoperative complications.  The patient is aware of the fact that transient use of cardiopulmonary bypass may be necessary. The patient has been advised of a variety of complications that might develop including but not limited to risks of death, stroke, paravalvular leak, aortic dissection or other major vascular complications, aortic annulus rupture, device embolization, cardiac rupture or perforation, mitral regurgitation, acute myocardial infarction, arrhythmia, heart block or bradycardia requiring permanent pacemaker  placement, congestive heart failure, respiratory failure, renal failure, pneumonia, infection, other late complications related to structural valve deterioration or migration, or other complications that might ultimately cause a temporary or permanent loss of functional independence or other long term morbidity. The patient provides full informed consent for the procedure as described and all questions were answered.      Plan:  She has tentatively been scheduled for transfemoral TAVR using a SAPIEN 3 valve on 06/04/2022.  She is scheduled for a dobutamine stress echocardiogram on 05/28/2022.  I spent 60 minutes performing this consultation and > 50% of this time was spent face to face counseling and coordinating the care of this patient's severe symptomatic aortic stenosis.   Gaye Pollack, MD 05/16/2022

## 2022-05-16 NOTE — Addendum Note (Signed)
Addended by: Rubie Maid B on: 05/16/2022 11:18 AM   Modules accepted: Orders

## 2022-05-16 NOTE — Addendum Note (Signed)
Addended by: Rubie Maid B on: 05/16/2022 10:01 AM   Modules accepted: Orders

## 2022-05-20 ENCOUNTER — Telehealth (HOSPITAL_COMMUNITY): Payer: Self-pay | Admitting: *Deleted

## 2022-05-20 ENCOUNTER — Ambulatory Visit (HOSPITAL_BASED_OUTPATIENT_CLINIC_OR_DEPARTMENT_OTHER): Payer: Medicare Other

## 2022-05-20 NOTE — Telephone Encounter (Signed)
Left message with instructions for upcoming stress test..  Regina Tate

## 2022-05-23 NOTE — Addendum Note (Signed)
Addended by: Rodman Key on: 05/23/2022 09:54 AM   Modules accepted: Orders

## 2022-05-23 NOTE — Addendum Note (Signed)
Addended by: Lenna Sciara on: 05/23/2022 09:56 AM   Modules accepted: Orders

## 2022-05-24 ENCOUNTER — Other Ambulatory Visit: Payer: Self-pay

## 2022-05-24 DIAGNOSIS — I350 Nonrheumatic aortic (valve) stenosis: Secondary | ICD-10-CM

## 2022-05-28 ENCOUNTER — Ambulatory Visit (HOSPITAL_COMMUNITY): Payer: Medicare Other | Attending: Internal Medicine

## 2022-05-28 ENCOUNTER — Encounter (HOSPITAL_COMMUNITY): Payer: Self-pay | Admitting: Internal Medicine

## 2022-05-28 ENCOUNTER — Ambulatory Visit (HOSPITAL_COMMUNITY): Payer: Medicare Other

## 2022-05-28 VITALS — Wt 145.0 lb

## 2022-05-28 DIAGNOSIS — Z006 Encounter for examination for normal comparison and control in clinical research program: Secondary | ICD-10-CM

## 2022-05-28 DIAGNOSIS — I350 Nonrheumatic aortic (valve) stenosis: Secondary | ICD-10-CM

## 2022-05-28 LAB — ECHOCARDIOGRAM STRESS TEST
AR max vel: 0.84 cm2
AV Mean grad: 57 mmHg
AV Peak grad: 77.4 mmHg
Ao pk vel: 4.4 m/s

## 2022-05-28 MED ORDER — SODIUM CHLORIDE 0.9 % IV SOLN
10.0000 ug/kg/min | INTRAVENOUS | Status: AC
  Administered 2022-05-28: 10 ug/kg/min via INTRAVENOUS

## 2022-05-31 ENCOUNTER — Ambulatory Visit (HOSPITAL_COMMUNITY)
Admission: RE | Admit: 2022-05-31 | Discharge: 2022-05-31 | Disposition: A | Discharge status: Home / Self Care | Payer: Medicare Other | Source: Ambulatory Visit | Attending: Internal Medicine | Admitting: Internal Medicine

## 2022-05-31 ENCOUNTER — Encounter (HOSPITAL_COMMUNITY)
Admission: RE | Admit: 2022-05-31 | Discharge: 2022-05-31 | Disposition: A | Discharge status: Home / Self Care | Payer: Medicare Other | Source: Ambulatory Visit | Attending: Internal Medicine | Admitting: Internal Medicine

## 2022-05-31 ENCOUNTER — Other Ambulatory Visit: Payer: Self-pay

## 2022-05-31 VITALS — BP 122/64 | HR 62 | Resp 14 | Wt 145.0 lb

## 2022-05-31 DIAGNOSIS — Z01818 Encounter for other preprocedural examination: Secondary | ICD-10-CM | POA: Insufficient documentation

## 2022-05-31 DIAGNOSIS — Z1152 Encounter for screening for COVID-19: Secondary | ICD-10-CM | POA: Diagnosis not present

## 2022-05-31 DIAGNOSIS — Z006 Encounter for examination for normal comparison and control in clinical research program: Secondary | ICD-10-CM

## 2022-05-31 DIAGNOSIS — I350 Nonrheumatic aortic (valve) stenosis: Secondary | ICD-10-CM | POA: Insufficient documentation

## 2022-05-31 LAB — COMPREHENSIVE METABOLIC PANEL
ALT: 26 U/L (ref 0–44)
AST: 29 U/L (ref 15–41)
Albumin: 4 g/dL (ref 3.5–5.0)
Alkaline Phosphatase: 60 U/L (ref 38–126)
Anion gap: 8 (ref 5–15)
BUN: 20 mg/dL (ref 8–23)
CO2: 25 mmol/L (ref 22–32)
Calcium: 8.6 mg/dL — ABNORMAL LOW (ref 8.9–10.3)
Chloride: 103 mmol/L (ref 98–111)
Creatinine, Ser: 0.8 mg/dL (ref 0.44–1.00)
GFR, Estimated: >60 mL/min (ref >60)
Glucose, Bld: 78 mg/dL (ref 70–99)
Potassium: 4.1 mmol/L (ref 3.5–5.1)
Sodium: 136 mmol/L (ref 135–145)
Total Bilirubin: 0.7 mg/dL (ref 0.3–1.2)
Total Protein: 6.4 g/dL — ABNORMAL LOW (ref 6.5–8.1)

## 2022-05-31 LAB — URINALYSIS, ROUTINE W REFLEX MICROSCOPIC
Bilirubin Urine: NEGATIVE
Glucose, UA: NEGATIVE mg/dL
Hgb urine dipstick: NEGATIVE
Ketones, ur: NEGATIVE mg/dL
Nitrite: NEGATIVE
Protein, ur: NEGATIVE mg/dL
Specific Gravity, Urine: 1.013 (ref 1.005–1.030)
pH: 5 (ref 5.0–8.0)

## 2022-05-31 LAB — PROTIME-INR
INR: 1 (ref 0.8–1.2)
Prothrombin Time: 13.6 seconds (ref 11.4–15.2)

## 2022-05-31 LAB — CBC
HCT: 39.3 % (ref 36.0–46.0)
Hemoglobin: 12.7 g/dL (ref 12.0–15.0)
MCH: 29.7 pg (ref 26.0–34.0)
MCHC: 32.3 g/dL (ref 30.0–36.0)
MCV: 92 fL (ref 80.0–100.0)
Platelets: 213 10*3/uL (ref 150–400)
RBC: 4.27 MIL/uL (ref 3.87–5.11)
RDW: 15.9 % — ABNORMAL HIGH (ref 11.5–15.5)
WBC: 7.4 10*3/uL (ref 4.0–10.5)
nRBC: 0 % (ref 0.0–0.2)

## 2022-05-31 LAB — TYPE AND SCREEN
ABO/RH(D): A POS
Antibody Screen: NEGATIVE

## 2022-05-31 LAB — SURGICAL PCR SCREEN
MRSA, PCR: NEGATIVE
Staphylococcus aureus: NEGATIVE

## 2022-05-31 NOTE — Progress Notes (Addendum)
Patient signed all consents at PAT lab appointment. CHG soap and instructions were given to patient. CHG surgical prep reviewed with patient and all questions answered.  Pt denies any respiratory illness/infection in the last two months.  Georgie Chard, NP with TAVR team made aware of abnormal UA result.

## 2022-05-31 NOTE — Research (Signed)
Reconsent Progress Informed Consent   Subject Name: Regina Tate  Subject met inclusion and exclusion criteria.  The informed consent form, study requirements and expectations were reviewed with the subject and questions and concerns were addressed prior to the signing of the consent form. The shared decision making tool was reviewed with the subject. The subject verbalized understanding of the trial requirements.  The subject agreed to participate in the Progress trial and signed the informed consent at 1015 on 05/31/2022.  The informed consent was obtained prior to performance of any protocol-specific procedures for the subject.  A copy of the signed informed consent was given to the subject and a copy was placed in the subject's medical record.   Regina Tate    Delayed AVR Pre Procedure Visit     Progress Pre procedure Visit   Medications:  Current Outpatient Medications:    amLODipine (NORVASC) 5 MG tablet, Take 5 mg by mouth daily., Disp: , Rfl:    Ascorbic Acid (VITAMIN C) 1000 MG tablet, Take 1,000 mg by mouth daily., Disp: , Rfl:    aspirin EC 81 MG tablet, Take 1 tablet (81 mg total) by mouth daily. Swallow whole., Disp: 90 tablet, Rfl: 3   atorvastatin (LIPITOR) 20 MG tablet, Take 1 tablet (20 mg total) by mouth daily., Disp: 90 tablet, Rfl: 2   Biotin 5000 MCG TABS, Take 1 capsule by mouth 3 (three) times a week., Disp: , Rfl:    CALCIUM PO, Take 1,000 mg by mouth 2 (two) times daily. Gummies, Disp: , Rfl:    Cholecalciferol (VITAMIN D) 2000 units CAPS, Take 2,000 Units by mouth daily., Disp: , Rfl:    diclofenac Sodium (VOLTAREN) 1 % GEL, Apply 1 Application topically daily as needed (pain)., Disp: , Rfl:    ELDERBERRY PO, Take 1 capsule by mouth daily., Disp: , Rfl:    ENBREL SURECLICK 50 MG/ML injection, Inject 50 mg into the skin once a week., Disp: , Rfl:    famotidine (PEPCID) 20 MG tablet, Take 20 mg by mouth 4 (four) times a week., Disp: , Rfl:     Flaxseed, Linseed, (FLAX SEED OIL) 1000 MG CAPS, Take 1,000 mg by mouth daily., Disp: , Rfl:    fluticasone (FLONASE) 50 MCG/ACT nasal spray, Place 1 spray into both nostrils 2 (two) times daily., Disp: , Rfl:    folic acid (FOLVITE) 1 MG tablet, Take 1 mg by mouth daily., Disp: , Rfl:    hydroxychloroquine (PLAQUENIL) 200 MG tablet, Take 200 mg by mouth 2 (two) times daily., Disp: , Rfl:    loratadine (CLARITIN) 10 MG tablet, Take 10 mg by mouth daily as needed for allergies., Disp: , Rfl:    methotrexate (RHEUMATREX) 2.5 MG tablet, Take 10 mg by mouth 2 (two) times a week. Thursday and Friday, Disp: , Rfl:    Multiple Vitamin (MULTIVITAMIN) tablet, Take 1 tablet by mouth daily., Disp: , Rfl:    Omega-3 Fatty Acids (FISH OIL) 1000 MG CAPS, Take 1,000 mg by mouth in the morning and at bedtime., Disp: , Rfl:    omeprazole (PRILOSEC) 40 MG capsule, Take 40 mg by mouth 3 (three) times a week., Disp: , Rfl:    pilocarpine (SALAGEN) 5 MG tablet, Take 5 mg by mouth 2 (two) times daily., Disp: , Rfl:    Polyethyl Glycol-Propyl Glycol (SYSTANE) 0.4-0.3 % SOLN, Place 1 drop into both eyes daily as needed (lubricant)., Disp: , Rfl:    Probiotic Product (PROBIOTIC PO), Take  1 capsule by mouth 2 (two) times a week., Disp: , Rfl:    TURMERIC PO, Take 482 mg by mouth daily., Disp: , Rfl:     Society of Thoracic Surgeons Score: STS RISK CALCULATOR:  Procedure: Isolated AVR Risk of Mortality: 2.014% Renal Failure: 1.368% Permanent Stroke: 1.257% Prolonged Ventilation: 4.763% DSW Infection: 0.049% Reoperation: 3.505% Morbidity or Mortality: 7.955% Short Length of Stay: 41.866% Long Length of Stay: 3.176%   European System for Cardiac Operative Risk Evaluation (EuroSCORE) ll: 1.63%    Surgical Risk assessed by Heart Team:  [x]  - Low []  - Intermediate []  - High []  - Extreme   New York Heart Association  (NYHA) ll   [place] Cardiovascular Society Score (CCS) angina  grade: 2   Transthoracic echocardiogram (TTE), including DSE as applicable: 05-08-2022  DSE: 05-28-2022   12- lead electrocardiogram: 05-31-2022   Cardiac CTA with 3D within 1 year: 05-14-2022   PFT: N/A   NIHSS MRS 05/31/2022   Labs: See lab results  KCCQ: See worksheet   [id number]: See worksheet   SF-36:  See worksheet

## 2022-06-01 LAB — PRO B NATRIURETIC PEPTIDE: NT-Pro BNP: 310 pg/mL (ref 0–738)

## 2022-06-01 LAB — SARS CORONAVIRUS 2 (TAT 6-24 HRS): SARS Coronavirus 2: NEGATIVE

## 2022-06-03 NOTE — Progress Notes (Signed)
Progress labs for review

## 2022-06-03 NOTE — H&P (Signed)
[address]       [name] [address]             [phone]      Cardiothoracic Surgery Admission History and Physical   PCP is Pahwani, Kasandra Knudsen, MD Referring Provider is Alverda Skeans, MD Primary Cardiologist is Meriam Sprague, MD   Reason for admission:  Severe aortic stenosis   HPI:   The patient is a 77 year old woman with a history of hyperlipidemia, rheumatoid arthritis, scleroderma, Sjogren's syndrome, Raynoud's syndrome, and moderate aortic stenosis who was enrolled in the PROGRESS trial and randomized to the clinical surveillance arm of the trial.  Her most recent echo on 05/08/2022 showed a trileaflet aortic valve with moderate calcification and thickening.  The mean gradient was measured at 24.2 mmHg with a valve area by VTI of 0.87 cm and mild aortic insufficiency.  Stroke-volume index was 39.  The left ventricular ejection fraction had decreased to 45 to 50% from her prior echo in January 2024 when it was 60 to 65%.  Her aortic valve mean gradient was measured at 22 mm Hg in July 2023 with a left ventricular ejection fraction of 55 to 60%.  She reports progression of symptoms since January 2024.  She has had worsening exertional shortness of breath and fatigue.  She has also noted repeated episodes of dizziness particularly when she is up on her feet for a while or bending over.  She denies syncope.  She has also had some episodes of left shoulder discomfort with exertion.  She denies orthopnea.  She sleeps on a couple pillows due to sinus drainage.  She has not noted any swelling in her legs although she said that her feet feel tight at the end of the day.   She is divorced and lives with a roommate who is a Runner, broadcasting/film/video.  She remains active doing water aerobics, yoga, and Silver Sneakers.       Past Medical History:  Diagnosis Date   Allergic rhinitis     CREST syndrome (HCC)     Deep vein thrombosis (DVT) (HCC)     GERD (gastroesophageal reflux  disease)     Hypercholesteremia     Hyperlipidemia     Osteopenia     RA (rheumatoid arthritis) (HCC)     Raynaud's syndrome without gangrene     Scleroderma (HCC)     Sjogren's syndrome with keratoconjunctivitis sicca (HCC)     Vitamin D deficiency             Past Surgical History:  Procedure Laterality Date   RIGHT/LEFT HEART CATH AND CORONARY ANGIOGRAPHY N/A 04/25/2021    Procedure: RIGHT/LEFT HEART CATH AND CORONARY ANGIOGRAPHY;  Surgeon: Orbie Pyo, MD;  Location: [facility];  Service: Cardiovascular;  Laterality: N/A;           Family History  Problem Relation Age of Onset   Breast cancer Neg Hx        Social History         Socioeconomic History   Marital status: Divorced      Spouse name: Not on file   Number of children: Not on file   Years of education: Not on file   Highest education level: Not on file  Occupational History   Not on file  Tobacco Use   Smoking status: Never   Smokeless tobacco: Never  Substance and Sexual Activity   Alcohol use: Not on file  Drug use: Not on file   Sexual activity: Not on file  Other Topics Concern   Not on file  Social History Narrative   Not on file    Social Determinants of Health    Financial Resource Strain: Not on file  Food Insecurity: Not on file  Transportation Needs: Not on file  Physical Activity: Not on file  Stress: Not on file  Social Connections: Not on file  Intimate Partner Violence: Not on file             Prior to Admission medications   Medication Sig Start Date End Date Taking? Authorizing Provider  amLODipine (NORVASC) 5 MG tablet Take 5 mg by mouth daily. 11/12/16   Yes [provider]  aspirin EC 81 MG tablet Take 1 tablet (81 mg total) by mouth daily. Swallow whole. 06/12/21   Yes Meriam Sprague, MD  atorvastatin (LIPITOR) 20 MG tablet Take 1 tablet (20 mg total) by mouth daily. 12/07/21   Yes Meriam Sprague, MD  Biotin 5 MG CAPS Take 5 mg by mouth  daily.     Yes [provider]  CALCIUM PO Take 1,200 mg by mouth 2 (two) times daily. Gummies     Yes [provider]  Cholecalciferol (VITAMIN D) 2000 units CAPS Take 2,000 Units by mouth daily.     Yes [provider]  Elderberry 500 MG CAPS Take 500 mg by mouth daily. 12/26/21   Yes [provider]  ENBREL SURECLICK 50 MG/ML injection Inject 50 mg into the skin once a week. 09/24/16   Yes [provider]  famotidine (PEPCID) 40 MG tablet Take 40 mg by mouth 4 (four) times a week.     Yes [provider]  Flaxseed, Linseed, (FLAX SEED OIL) 1000 MG CAPS Take 1,000 mg by mouth daily.     Yes [provider]  fluticasone (FLONASE) 50 MCG/ACT nasal spray Place 1 spray into both nostrils 2 (two) times daily.     Yes [provider]  folic acid (FOLVITE) 1 MG tablet Take 1 mg by mouth daily. 11/11/16   Yes [provider]  hydroxychloroquine (PLAQUENIL) 200 MG tablet Take 200 mg by mouth 2 (two) times daily. 11/11/16   Yes [provider]  loratadine (CLARITIN) 10 MG tablet Take 10 mg by mouth daily as needed for allergies.     Yes [provider]  methotrexate (RHEUMATREX) 2.5 MG tablet Take 10 mg by mouth 2 (two) times a week. Thursday and Friday 11/13/16   Yes [provider]  metoprolol tartrate (LOPRESSOR) 50 MG tablet Take as directed prior to 3/19 CT scan 05/13/22   Yes Orbie Pyo, MD  Multiple Vitamin (MULTIVITAMIN) tablet Take 1 tablet by mouth daily.     Yes [provider]  Omega-3 Fatty Acids (FISH OIL) 1000 MG CAPS Take 2,000 mg by mouth daily.     Yes [provider]  omeprazole (PRILOSEC) 40 MG capsule Take 40 mg by mouth 3 (three) times a week. 11/20/16   Yes [provider]  pilocarpine (SALAGEN) 5 MG tablet Take 5 mg by mouth 2 (two) times daily. 11/12/16   Yes [provider]  Polyethyl Glycol-Propyl Glycol (SYSTANE) 0.4-0.3 % SOLN Place 1 drop  into both eyes daily as needed (lubricant).     Yes [provider]  Probiotic Product (PROBIOTIC PO) Take 1 capsule by mouth 2 (two) times a week.     Yes [provider]  Turmeric 450 MG CAPS Take 450 mg by mouth 2 (two) times daily.     Yes [provider]            Current Outpatient Medications  Medication Sig Dispense Refill   amLODipine (NORVASC) 5 MG tablet Take 5 mg by mouth daily.       aspirin EC 81 MG tablet Take 1 tablet (81 mg total) by mouth daily. Swallow whole. 90 tablet 3   atorvastatin (LIPITOR) 20 MG tablet Take 1 tablet (20 mg total) by mouth daily. 90 tablet 2   Biotin 5 MG CAPS Take 5 mg by mouth daily.       CALCIUM PO Take 1,200 mg by mouth 2 (two) times daily. Gummies       Cholecalciferol (VITAMIN D) 2000 units CAPS Take 2,000 Units by mouth daily.       Elderberry 500 MG CAPS Take 500 mg by mouth daily.       ENBREL SURECLICK 50 MG/ML injection Inject 50 mg into the skin once a week.       famotidine (PEPCID) 40 MG tablet Take 40 mg by mouth 4 (four) times a week.       Flaxseed, Linseed, (FLAX SEED OIL) 1000 MG CAPS Take 1,000 mg by mouth daily.       fluticasone (FLONASE) 50 MCG/ACT nasal spray Place 1 spray into both nostrils 2 (two) times daily.       folic acid (FOLVITE) 1 MG tablet Take 1 mg by mouth daily.       hydroxychloroquine (PLAQUENIL) 200 MG tablet Take 200 mg by mouth 2 (two) times daily.       loratadine (CLARITIN) 10 MG tablet Take 10 mg by mouth daily as needed for allergies.       methotrexate (RHEUMATREX) 2.5 MG tablet Take 10 mg by mouth 2 (two) times a week. Thursday and Friday       metoprolol tartrate (LOPRESSOR) 50 MG tablet Take as directed prior to 3/19 CT scan 1 tablet 0   Multiple Vitamin (MULTIVITAMIN) tablet Take 1 tablet by mouth daily.       Omega-3 Fatty Acids (FISH OIL) 1000 MG CAPS Take 2,000 mg by mouth daily.       omeprazole (PRILOSEC) 40 MG capsule Take 40 mg by mouth 3 (three) times a week.        pilocarpine (SALAGEN) 5 MG tablet Take 5 mg by mouth 2 (two) times daily.       Polyethyl Glycol-Propyl Glycol (SYSTANE) 0.4-0.3 % SOLN Place 1 drop into both eyes daily as needed (lubricant).       Probiotic Product (PROBIOTIC PO) Take 1 capsule by mouth 2 (two) times a week.       Turmeric 450 MG CAPS Take 450 mg by mouth 2 (two) times daily.        No current facility-administered medications for this visit.           Allergies  Allergen Reactions   Codeine        Other reaction(s): makes pt the next day feel like she was drugged   Molds & Smuts        Itching eyes, congestion, headache   Pollen Extract Itching      Itching eyes, congestion, headache          Review of Systems:               General:  normal appetite, + decreased energy, no weight gain, no weight loss, no fever             Cardiac:                       no chest pain with exertion but has had left shoulder pain, no chest pain at rest, +SOB with moderate exertion, no resting SOB, no PND, no orthopnea, no palpitations, no arrhythmia, no atrial fibrillation, no LE edema, + dizzy spells, no syncope             Respiratory:                 + exertional shortness of breath, no home oxygen, no productive cough, no dry cough, no bronchitis, no wheezing, no hemoptysis, no asthma, no pain with inspiration or cough, no sleep apnea, no CPAP at night             GI:                               no difficulty swallowing, no reflux, no frequent heartburn, no hiatal hernia, no abdominal pain, no constipation, no diarrhea, no hematochezia, no hematemesis, no melena             GU:                              no dysuria,  no frequency, no urinary tract infection, no hematuria,  no kidney stones, no kidney disease             Vascular:                     no pain suggestive of claudication, no pain in feet, no leg cramps, no varicose veins, no DVT, no non-healing foot ulcer             Neuro:                          no stroke, no TIA's, no seizures, no headaches, no temporary blindness one eye,  no slurred speech, no peripheral neuropathy, + chronic pain, no instability of gait, no memory/cognitive dysfunction             Musculoskeletal:         + rheumatoid arthritis on Enbrel, methotrexate, and plaquenil  , + joint swelling, no myalgias, no difficulty walking, normal mobility              Skin:                            no rash, no itching, no skin infections, no pressure sores or ulcerations             Psych:                         no anxiety, n depression, ono nervousness, no unusual recent stress             Eyes:                           no blurry vision, no floaters, no recent vision changes, + wears glasses  ENT:                            no hearing loss, no loose or painful teeth, no dentures, sees dentis regularly             Hematologic:               no easy bruising, no abnormal bleeding, no clotting disorder, no frequent epistaxis             Endocrine:                   no diabetes, does not check CBG's at home                            Physical Exam:               BP (!) 121/90 (BP Location: Right Arm, Patient Position: Sitting, Cuff Size: Normal)   Pulse 88   Resp 20   Ht 5\' 4"  (1.626 m)   Wt 145 lb (65.8 kg)   SpO2 99% Comment: RA  BMI 24.89 kg/m              General:                      well-appearing             HEENT:                       Unremarkable, NCAT, PERLA, EOMI             Neck:                           no JVD, no bruits, no adenopathy              Chest:                          clear to auscultation, symmetrical breath sounds, no wheezes, no rhonchi              CV:                              RRR, 3/6 systolic murmur RSB, no diastolic murmur             Abdomen:                    soft, non-tender, no masses              Extremities:                 warm, well-perfused, pedal pulses palpable, no lower extremity edema             Rectal/GU                    Deferred             Neuro:                         Grossly non-focal and symmetrical throughout             Skin:  Clean and dry, no rashes, no breakdown   Diagnostic Tests:   ECHOCARDIOGRAM REPORT       Patient Name:   Regina Tate Date of Exam: 05/08/2022  Medical Rec #:  [id number]                 Height:       64.0 in  Accession #:    [id number]                Weight:       146.8 lb  Date of Birth:  08/11/1945                  BSA:          1.715 m  Patient Age:    76 years                  BP:           138/77 mmHg  Patient Gender: F                         HR:           76 bpm.  Exam Location:  [address]   Procedure: 2D Echo, Cardiac Doppler, Color Doppler and Strain Analysis   Indications:    I35.0 Aortic Stenosis    History:        Patient has prior history of Echocardiogram examinations,  most                 recent 03/18/2022. Risk Factors:HLD. No cardiac history.    Sonographer:    Clearence Pedammie Crouch RCS  Referring Phys: [id number] Orbie PyoARUN K THUKKANI   IMPRESSIONS     1. Left ventricular ejection fraction, by estimation, is 45 to 50%. The  left ventricle has mildly decreased function. The left ventricle has no  regional wall motion abnormalities. Left ventricular diastolic parameters  are indeterminate. The average left   ventricular global longitudinal strain is -19.4 %. The global  longitudinal strain is normal.   2. Right ventricular systolic function is normal. The right ventricular  size is normal. There is normal pulmonary artery systolic pressure.   3. The mitral valve is normal in structure. Trivial mitral valve  regurgitation. No evidence of mitral stenosis.   4. The aortic valve is tricuspid. There is moderate calcification of the  aortic valve. There is moderate thickening of the aortic valve. Aortic  valve regurgitation is mild. Moderate aortic valve stenosis. Aortic  regurgitation PHT measures 479 msec.   Aortic valve area, by VTI measures 0.87 cm. Aortic valve mean gradient  measures 24.2 mmHg. Aortic valve Vmax measures 3.13 m/s.   5. The inferior vena cava is normal in size with greater than 50%  respiratory variability, suggesting right atrial pressure of 3 mmHg.   FINDINGS   Left Ventricle: Left ventricular ejection fraction, by estimation, is 45  to 50%. The left ventricle has mildly decreased function. The left  ventricle has no regional wall motion abnormalities. The average left  ventricular global longitudinal strain is  -19.4 %. The global longitudinal strain is normal. The left ventricular  internal cavity size was normal in size. There is no left ventricular  hypertrophy. Left ventricular diastolic parameters are indeterminate.   Right Ventricle: The right ventricular size is normal. No increase in  right ventricular wall thickness. Right ventricular systolic function is  normal. There is normal pulmonary artery systolic pressure.  The tricuspid  regurgitant velocity is 1.37 m/s, and   with an assumed right atrial pressure of 3 mmHg, the estimated right  ventricular systolic pressure is 10.5 mmHg.   Left Atrium: Left atrial size was normal in size.   Right Atrium: Right atrial size was normal in size.   Pericardium: There is no evidence of pericardial effusion.   Mitral Valve: The mitral valve is normal in structure. Trivial mitral  valve regurgitation. No evidence of mitral valve stenosis.   Tricuspid Valve: The tricuspid valve is normal in structure. Tricuspid  valve regurgitation is trivial. No evidence of tricuspid stenosis.   Aortic Valve: The aortic valve is tricuspid. There is moderate  calcification of the aortic valve. There is moderate thickening of the  aortic valve. Aortic valve regurgitation is mild. Aortic regurgitation PHT  measures 479 msec. Moderate aortic stenosis  is present. Aortic valve mean gradient measures 24.2 mmHg. Aortic valve  peak  gradient measures 39.1 mmHg. Aortic valve area, by VTI measures 0.87  cm.   Pulmonic Valve: The pulmonic valve was normal in structure. Pulmonic valve  regurgitation is trivial. No evidence of pulmonic stenosis.   Aorta: The aortic root is normal in size and structure.   Venous: The inferior vena cava is normal in size with greater than 50%  respiratory variability, suggesting right atrial pressure of 3 mmHg.   IAS/Shunts: No atrial level shunt detected by color flow Doppler.     LEFT VENTRICLE  PLAX 2D  LVIDd:         4.80 cm   Diastology  LVIDs:         3.70 cm   LV e' medial:    6.20 cm/s  LV PW:         0.80 cm   LV E/e' medial:  11.0  LV IVS:        0.90 cm   LV e' lateral:   10.00 cm/s  LVOT diam:     1.90 cm   LV E/e' lateral: 6.8  LV SV:         66  LV SV Index:   39        2D Longitudinal Strain  LVOT Area:     2.84 cm  2D Strain GLS (A2C):   -22.1 %                           2D Strain GLS (A3C):   -18.0 %                           2D Strain GLS (A4C):   -18.1 %                           2D Strain GLS Avg:     -19.4 %   RIGHT VENTRICLE  RV Basal diam:  3.20 cm  RV S prime:     10.30 cm/s  TAPSE (M-mode): 1.8 cm  RVSP:           10.5 mmHg   LEFT ATRIUM             Index        RIGHT ATRIUM           Index  LA diam:        3.40 cm 1.98 cm/m   RA Pressure: 3.00 mmHg  LA Vol (  A2C):   50.7 ml 29.56 ml/m  RA Area:     12.20 cm  LA Vol (A4C):   31.5 ml 18.36 ml/m  RA Volume:   27.30 ml  15.91 ml/m  LA Biplane Vol: 43.3 ml 25.24 ml/m   AORTIC VALVE  AV Area (Vmax):    0.91 cm  AV Area (Vmean):   0.85 cm  AV Area (VTI):     0.87 cm  AV Vmax:           312.60 cm/s  AV Vmean:          232.800 cm/s  AV VTI:            0.765 m  AV Peak Grad:      39.1 mmHg  AV Mean Grad:      24.2 mmHg  LVOT Vmax:         100.50 cm/s  LVOT Vmean:        70.150 cm/s  LVOT VTI:          0.234 m  LVOT/AV VTI ratio: 0.31  AI PHT:            479 msec    AORTA  Ao Root diam:  3.20 cm  Ao Asc diam:  3.50 cm   MITRAL VALVE               TRICUSPID VALVE  MV Area (PHT):             TR Peak grad:   7.5 mmHg  MV Decel Time:             TR Vmax:        137.00 cm/s  MV E velocity: 68.10 cm/s  Estimated RAP:  3.00 mmHg  MV A velocity: 73.40 cm/s  RVSP:           10.5 mmHg  MV E/A ratio:  0.93                             SHUNTS                             Systemic VTI:  0.23 m                             Systemic Diam: 1.90 cm   Chilton Si MD  Electronically signed by Chilton Si MD  Signature Date/Time: 05/08/2022/5:12:14 PM        Final        Physicians   Panel Physicians Referring Physician Case Authorizing Physician  Orbie Pyo, MD (Primary)        Procedures   RIGHT/LEFT HEART CATH AND CORONARY ANGIOGRAPHY    Conclusion       LV end diastolic pressure is normal.   The left ventricular ejection fraction is 55-65% by visual estimate.   1.  Mild obstructive coronary artery disease 2.  Cardiac output of 8.6 L/min and cardiac index of 5.0 L/min/m. 3.  RA pressure of 8/4 with a mean of 3 mmHg, wedge pressure of 8/6 with a mean of 4 mmHg, pulmonary artery pressure 24/7, and papi of greater than 5.   Indications   Chronic diastolic heart failure (HCC) [I50.32 (ICD-10-CM)]    Clinical Presentation   CHF/Shock Congestive heart failure not present. No shock present.    Procedural  Details   Technical Details The patient is a 77 year old female with a history of moderate symptomatic aortic stenosis, Sjogren's disease, scleroderma, Raynaud's disease, and rheumatoid arthritis who was evaluated for dyspnea.  She is referred for right heart catheterization, coronary angiography study, and left heart catheterization for this indication as well as possible inclusion in the PROGRESS trial.  After obtaining consent the patient was brought to the cardiac catheterization laboratory prepped draped sterile fashion ultrasound was used to gain  access to the right radial artery and a 6 French glide sheath placed there.  5mg  of verapamil and 5000 units of heparin were administered through the sheath.  A previously placed antecubital IV was exchanged for a 5 French femoral glide sheath.  Right heart catheterization, coronary angiography, and left heart catheterization study was then performed.  At the conclusion of the procedure manual pressure was applied to the antecubital site and a TR band was placed on the radial site.  Estimated blood loss <50 mL.   During this procedure medications were administered to achieve and maintain moderate conscious sedation while the patient's heart rate, blood pressure, and oxygen saturation were continuously monitored and I was present face-to-face 100% of this time.    Medications (Filter: Administrations occurring from 0744 to 0837 on 04/25/21)  important  Continuous medications are totaled by the amount administered until 04/25/21 0837.    Heparin (Porcine) in NaCl 1000-0.9 UT/500ML-% SOLN (mL)  Total volume: 1,000 mL Date/Time Rate/Dose/Volume Action    04/25/21 0751 500 mL Given    0751 500 mL Given    fentaNYL (SUBLIMAZE) injection (mcg)  Total dose: 25 mcg Date/Time Rate/Dose/Volume Action    04/25/21 0802 25 mcg Given    midazolam (VERSED) injection (mg)  Total dose: 1 mg Date/Time Rate/Dose/Volume Action    04/25/21 0802 1 mg Given    lidocaine (PF) (XYLOCAINE) 1 % injection (mL)  Total volume: 4 mL Date/Time Rate/Dose/Volume Action    04/25/21 0804 2 mL Given    0806 2 mL Given    Radial Cocktail/Verapamil only  Total dose: Cannot be calculated* *Administration dose not documented Date/Time Rate/Dose/Volume Action    04/25/21 0806   Given    Radial Cocktail (Verapamil 5 mg, NTG, Lidocaine) (mL)  Total volume: 10 mL Date/Time Rate/Dose/Volume Action    04/25/21 0806 10 mL Given    heparin sodium (porcine) injection (Units)  Total dose: 5,000 Units Date/Time Rate/Dose/Volume  Action    04/25/21 0807 5,000 Units Given      Sedation Time   Sedation Time Physician-1: 19 minutes 58 seconds Radiation/Fluoro   Fluoro time: 3.4 (min) DAP: 7789 (mGycm2) Cumulative Air Kerma: 107 (mGy) Complications   Complications documented before study signed (04/25/2021  8:37 AM)    RIGHT/LEFT HEART CATH AND CORONARY ANGIOGRAPHY   None Documented by Orbie Pyo, MD 04/25/2021  8:31 AM  Date Found: 04/25/2021  Time Range: Intraprocedure         Coronary Findings   Diagnostic Dominance: Right Left Anterior Descending  There is mild diffuse disease throughout the vessel.    First Diagonal Branch  The vessel exhibits minimal luminal irregularities.    Right Coronary Artery  The vessel exhibits minimal luminal irregularities.    Intervention    No interventions have been documented.    Wall Motion       The following segments are normal: mid anterior, mid inferior, basilar anterior, basilar inferior, apical anterior and apical inferior.  Left Heart   Left Ventricle LV end diastolic pressure is normal. The left ventricular ejection fraction is 55-65% by visual estimate.    Coronary Diagrams   Diagnostic Dominance: Right  Intervention    Implants    No implant documentation for this case.    [employer]ng[employer] Images    Show images for CARDIAC CATHETERIZATION Images on Long Term Storage    Show images for Sharece, Fleischhacker to Procedure Log   Procedure Log    Hemo Data   Flowsheet Row Most Recent Value  Fick Cardiac Output 8.66 L/min  Fick Cardiac Output Index 5.01 (L/min)/BSA  Aortic Mean Gradient 15.46 mmHg  Aortic Peak Gradient 10.3 mmHg  Aortic Valve Area 2.34  Aortic Value Area Index 1.36 cm2/BSA  RA A Wave 8 mmHg  RA V Wave 4 mmHg  RA Mean 3 mmHg  RV Systolic Pressure 31 mmHg  RV Diastolic Pressure 0 mmHg  RV EDP 3 mmHg  PA Systolic Pressure 24 mmHg  PA Diastolic Pressure 7 mmHg  PA Mean 14 mmHg  PW A Wave 8  mmHg  PW V Wave 6 mmHg  PW Mean 4 mmHg  AO Systolic Pressure 107 mmHg  AO Diastolic Pressure 71 mmHg  AO Mean 88 mmHg  LV Systolic Pressure 136 mmHg  LV Diastolic Pressure 1 mmHg  LV EDP 9 mmHg  AOp Systolic Pressure 118 mmHg  AOp Diastolic Pressure 59 mmHg  AOp Mean Pressure 84 mmHg  LVp Systolic Pressure 130 mmHg  LVp Diastolic Pressure 18 mmHg  LVp EDP Pressure 23 mmHg  QP/QS 0.89  TPVR Index 3.14 HRUI  TSVR Index 17.57 HRUI  PVR SVR Ratio 0.13  TPVR/TSVR Ratio 0.18      ADDENDUM REPORT: 05/14/2022 18:41   CLINICAL DATA:  Aortic Valve pathology with assessment for TAVR   EXAM: Cardiac TAVR CT   TECHNIQUE: The patient was scanned on a Siemens Force 192 slice scanner. A 120 kV retrospective scan was triggered in the descending thoracic aorta at 111 HU's. Gantry rotation speed was 270 msecs and collimation was .9 mm. No beta blockade or nitro were given. The 3D data set was reconstructed in 5% intervals of the R-R cycle. Systolic and diastolic phases were analyzed on a dedicated work station using MPR, MIP and VRT modes. The patient received 100 cc of contrast.   FINDINGS: Aortic Valve: Isolated calcification and thickening of the non coronary cusp leaflet of the aortic valve with reduced excursion of this leaflet; the planimeter valve area is 1.30 Sq cm consistent with moderate aortic stenosis   Number of leaflets: Three   LVOT calcification: None   Annular calcification: None   Aortic Valve Calcium Score: 1007   Perimembranous septal diameter: 5 mm   Mitral Valve: No calcifications   Aortic Annulus Measurements- Systole 30 %   Major annulus diameter: 26 mm   Minor annulus diameter: 21 mm   Annular perimeter: 72 mm   Annular area: 3.88 cm2   LVOT area: 3.68 cm2   Aortic Annulus Measurements- Diastole 70%   Major annulus diameter: 26 mm   Minor annulus diameter: 18 mm   Annular perimeter: 69 mm   Annular area: 3.56 cm2   LVOT area: 3.47  cm2   Aortic Root Measurements   Sinotubular Junction: 28 mm   Ascending Thoracic Aorta: 35 mm   Aortic Arch: 26 mm   Descending Thoracic Aorta: 23 mm   Sinus of Valsalva Measurements:   Right coronary cusp width:  28 mm   Left coronary cusp width: 28 mm   Non coronary cusp width: 28 mm   Coronary Artery Height above Annulus:   Left Main: 15 mm   Left SoV height: 19 mm   Right Coronary: 15 mm   Right SoV height: 19 mm   Non SoV height: 19 mm   Optimum Fluoroscopic Angle for Delivery: LAO 17 CAU 10   Valves for structural team consideration: 23 mm Sapien Valve   Non TAVR Valve Findings:   Coronary Arteries: Normal coronary origin. Study not completed with nitroglycerin.   Coronary Calcium Score:   Left main: 0   Left anterior descending artery: 310   Left circumflex artery: 41   Right coronary artery: 77   Total: 428   Percentile: 85th for age, sex, and race matched control.   Systemic veins: Normal anatomy   Main Pulmonary artery: Normal caliber   Pulmonary veins: Normal anatomy   Left atrial appendage: Patent   Interatrial septum: No clear communications   Left ventricle: Normal size   Left atrium: Normal size   Right ventricle: Normal size   Right atrium: Normal size   Pericardium: No calcifications   Extra Cardiac Findings as per separate reporting.   Notable artifacts: None   IMPRESSION: 1. Moderate Aortic stenosis. Findings pertinent to TAVR procedure are detailed above.   2. Similar valve findings from 2023 study. No change in valve sizing recommendation.   RECOMMENDATIONS:   The proposed cut-off value of 1,651 AU yielded a 93 % sensitivity and 75 % specificity in grading AS severity in patients with classical low-flow, low-gradient AS. Proposed different cut-off values to define severe AS for men and women as 2,065 AU and 1,274 AU, respectively. The joint European and American recommendations for the assessment of AS  consider the aortic valve calcium score as a continuum - a very high calcium score suggests severe AS and a low calcium score suggests severe AS is unlikely.   Regina Tate, et al. 2017 ESC/EACTS Guidelines for the management of valvular heart disease. Eur Heart J [phone]   Coronary artery calcium (CAC) score is a strong predictor of incident coronary heart disease (CHD) and provides predictive information beyond traditional risk factors. CAC scoring is reasonable to use in the decision to withhold, postpone, or initiate statin therapy in intermediate-risk or selected borderline-risk asymptomatic adults (age 15-75 years and LDL-C >=70 to <190 mg/dL) who do not have diabetes or established atherosclerotic cardiovascular disease (ASCVD).* In intermediate-risk (10-year ASCVD risk >=7.5% to <20%) adults or selected borderline-risk (10-year ASCVD risk >=5% to <7.5%) adults in whom a CAC score is measured for the purpose of making a treatment decision the following recommendations have been made:   If CAC = 0, it is reasonable to withhold statin therapy and reassess in 5 to 10 years, as long as higher risk conditions are absent (diabetes mellitus, family history of premature CHD in first degree relatives (males <55 years; females <65 years), cigarette smoking, LDL >=190 mg/dL or other independent risk factors).   If CAC is 1 to 99, it is reasonable to initiate statin therapy for patients >=67 years of age.   If CAC is >=100 or >=75th percentile, it is reasonable to initiate statin therapy at any age.   Cardiology referral should be considered for patients with CAC scores >=400 or >=75th percentile.   *2018 AHA/ACC/AACVPR/AAPA/ABC/ACPM/ADA/AGS/APhA/ASPC/NLA/PCNA Guideline on the Management of Blood Cholesterol: A Report of the [employer] of Cardiology/American  Heart Association Task Force on Clinical Practice Guidelines. J Am Coll  Cardiol. 2019;73(24):3168-3209.   Regina Tate     Electronically Signed   By: Regina Tate M.D.   On: 05/14/2022 18:41          Narrative & Impression  CLINICAL DATA:  Preop evaluation for aortic valve replacement   EXAM: CT ANGIOGRAPHY CHEST, ABDOMEN AND PELVIS   TECHNIQUE: Non-contrast CT of the chest was initially obtained.   Multidetector CT imaging through the chest, abdomen and pelvis was performed using the standard protocol during bolus administration of intravenous contrast. Multiplanar reconstructed images and MIPs were obtained and reviewed to evaluate the vascular anatomy.   RADIATION DOSE REDUCTION: This exam was performed according to the departmental dose-optimization program which includes automated exposure control, adjustment of the mA and/or kV according to patient size and/or use of iterative reconstruction technique.   CONTRAST:  OMNIPAQUE IOHEXOL 350 MG/ML SOLN   COMPARISON:  None Available.   FINDINGS: CTA CHEST FINDINGS   Cardiovascular: Normal heart size. Pericardial effusion. Aortic valve thickening and calcifications. Normal caliber thoracic aorta with mild atherosclerotic disease standard three-vessel aortic arch with no significant stenosis. Three vessel coronary artery disease.   Mediastinum/Nodes: Esophagus and thyroid unremarkable. No pathologically enlarged lymph nodes seen in the chest.   Lungs/Pleura: Central airways are patent. No consolidation, pleural effusion or pneumothorax.   Musculoskeletal: No chest wall abnormality. No acute or significant osseous findings.   CTA ABDOMEN AND PELVIS FINDINGS   Hepatobiliary: No focal liver abnormality is seen. No gallstones, gallbladder wall thickening, or biliary dilatation.   Pancreas: Unremarkable. No pancreatic ductal dilatation or surrounding inflammatory changes.   Spleen: Normal in size without focal abnormality.   Adrenals/Urinary Tract: Adrenal  glands are unremarkable. Kidneys are normal, without renal calculi, focal lesion, or hydronephrosis. Bladder is unremarkable.   Stomach/Bowel: Stomach is within normal limits. Appendix appears normal. No evidence of bowel wall thickening, distention, or inflammatory changes.   Vascular/lymphatic: Normal caliber abdominal aorta with mild atherosclerotic disease. Significant stenosis. No enlarged lymph nodes seen in the abdomen or pelvis.   Reproductive: Uterus and bilateral adnexa are unremarkable.   Other: No abdominal wall hernia or abnormality. No abdominopelvic ascites.   Musculoskeletal: No acute or significant osseous findings.   VASCULAR MEASUREMENTS PERTINENT TO TAVR:   AORTA:   Minimal Aortic Diameter -  14.1 mm   Severity of Aortic Calcification-mild   RIGHT PELVIS:   Right Common Iliac Artery -   Minimal Diameter-8.0 mm   Tortuosity-mild   Calcification-moderate   Right External Iliac Artery -   Minimal Diameter-7.4 mm   Tortuosity-moderate   Calcification-none   Right Common Femoral Artery -   Minimal Diameter-5.8 mm   Tortuosity-none   Calcification-mild   LEFT PELVIS:   Left Common Iliac Artery -   Minimal Diameter-7.4 mm   Tortuosity-mild   Calcification-moderate   Left External Iliac Artery -   Minimal Diameter-6.8 mm   Tortuosity-mild   Calcification-none   Left Common Femoral Artery -   Minimal Diameter-6.1 mm   Tortuosity-none   Calcification-moderate   Review of the MIP images confirms the above findings.   IMPRESSION: 1. Vascular findings and measurements pertinent to potential TAVR procedure, as detailed above. 2. Thickening and calcification of the aortic valve, compatible with reported clinical history of aortic stenosis. 3. 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 Three vessel coronary artery disease.     Electronically Signed   By: Aliene Altes.D.  On: 05/14/2022 12:26           Impression:   This 77 year old woman has a history of moderate aortic stenosis in the surveillance arm of the PROGRESS trial and has now developed NYHA class II symptoms of exertional fatigue and shortness of breath as well as exertional dizziness and left shoulder discomfort.  She has had a decrease in her left ventricular ejection fraction to 45 to 50% with a stable mean aortic valve gradient of 24.2 mmHg and a valve area by VTI of 0.87 cm.  Her valve looks like a severely stenotic valve on echocardiogram.  Her progression of symptoms and reduced ejection fraction would suggest development of severe low-flow/low gradient aortic stenosis. Dobutamine stress echo showed and increase in the mean gradient from 23 mm Hg to 46 mm Hg during stress consistent with severe AS.  I agree that aortic valve replacement is indicated in this patient for relief of her symptoms and to prevent further left ventricular deterioration.  Her gated cardiac CTA shows anatomy suitable for TAVR using a 23 mm SAPIEN 3 valve.  Her abdominal and pelvic CTA shows adequate pelvic vascular access to allow transfemoral insertion.  She has been scheduled for a dobutamine stress echocardiogram on 05/28/2022 to document progression to severe aortic stenosis.   The patient was counseled at length regarding treatment alternatives for management of severe symptomatic aortic stenosis. The risks and benefits of surgical intervention has been discussed in detail. Long-term prognosis with medical therapy was discussed. Alternative approaches such as conventional surgical aortic valve replacement, transcatheter aortic valve replacement, and palliative medical therapy were compared and contrasted at length. This discussion was placed in the context of the patient's own specific clinical presentation and past medical history. All of her questions have been addressed.    Following the decision to proceed with transcatheter aortic valve replacement, a  discussion was held regarding what types of management strategies would be attempted intraoperatively in the event of life-threatening complications, including whether or not the patient would be considered a candidate for the use of cardiopulmonary bypass and/or conversion to open sternotomy for attempted surgical intervention.  She is only 73 and still physically active and exercising several days per week and I think she would be a candidate for emergent sternotomy to manage any intraoperative complications.  The patient is aware of the fact that transient use of cardiopulmonary bypass may be necessary. The patient has been advised of a variety of complications that might develop including but not limited to risks of death, stroke, paravalvular leak, aortic dissection or other major vascular complications, aortic annulus rupture, device embolization, cardiac rupture or perforation, mitral regurgitation, acute myocardial infarction, arrhythmia, heart block or bradycardia requiring permanent pacemaker placement, congestive heart failure, respiratory failure, renal failure, pneumonia, infection, other late complications related to structural valve deterioration or migration, or other complications that might ultimately cause a temporary or permanent loss of functional independence or other long term morbidity. The patient provides full informed consent for the procedure as described and all questions were answered.       Plan:   Transfemoral TAVR using a SAPIEN 3 valve.       Alleen Borne, MD

## 2022-06-04 ENCOUNTER — Other Ambulatory Visit: Payer: Self-pay

## 2022-06-04 ENCOUNTER — Inpatient Hospital Stay (HOSPITAL_COMMUNITY)
Admission: RE | Admit: 2022-06-04 | Discharge: 2022-06-05 | DRG: 267 | Disposition: A | Discharge status: Home / Self Care | Payer: Medicare Other | Attending: Internal Medicine | Admitting: Internal Medicine

## 2022-06-04 ENCOUNTER — Inpatient Hospital Stay (HOSPITAL_COMMUNITY): Payer: Medicare Other | Admitting: Emergency Medicine

## 2022-06-04 ENCOUNTER — Encounter (HOSPITAL_COMMUNITY)
Admission: RE | Disposition: A | Discharge status: Home / Self Care | Payer: Self-pay | Source: Home / Self Care | Attending: Internal Medicine

## 2022-06-04 ENCOUNTER — Inpatient Hospital Stay (HOSPITAL_COMMUNITY): Payer: Medicare Other

## 2022-06-04 ENCOUNTER — Encounter (HOSPITAL_COMMUNITY): Payer: Self-pay | Admitting: Internal Medicine

## 2022-06-04 ENCOUNTER — Other Ambulatory Visit: Payer: Self-pay | Admitting: Cardiology

## 2022-06-04 DIAGNOSIS — E559 Vitamin D deficiency, unspecified: Secondary | ICD-10-CM | POA: Diagnosis present

## 2022-06-04 DIAGNOSIS — I7300 Raynaud's syndrome without gangrene: Secondary | ICD-10-CM | POA: Insufficient documentation

## 2022-06-04 DIAGNOSIS — M8580 Other specified disorders of bone density and structure, unspecified site: Secondary | ICD-10-CM | POA: Diagnosis present

## 2022-06-04 DIAGNOSIS — Z79899 Other long term (current) drug therapy: Secondary | ICD-10-CM | POA: Diagnosis not present

## 2022-06-04 DIAGNOSIS — Z952 Presence of prosthetic heart valve: Secondary | ICD-10-CM | POA: Diagnosis not present

## 2022-06-04 DIAGNOSIS — I5030 Unspecified diastolic (congestive) heart failure: Secondary | ICD-10-CM | POA: Diagnosis not present

## 2022-06-04 DIAGNOSIS — M3500 Sicca syndrome, unspecified: Secondary | ICD-10-CM | POA: Diagnosis present

## 2022-06-04 DIAGNOSIS — Z86718 Personal history of other venous thrombosis and embolism: Secondary | ICD-10-CM | POA: Diagnosis not present

## 2022-06-04 DIAGNOSIS — Z7982 Long term (current) use of aspirin: Secondary | ICD-10-CM

## 2022-06-04 DIAGNOSIS — I350 Nonrheumatic aortic (valve) stenosis: Secondary | ICD-10-CM

## 2022-06-04 DIAGNOSIS — K219 Gastro-esophageal reflux disease without esophagitis: Secondary | ICD-10-CM | POA: Diagnosis present

## 2022-06-04 DIAGNOSIS — M349 Systemic sclerosis, unspecified: Secondary | ICD-10-CM | POA: Insufficient documentation

## 2022-06-04 DIAGNOSIS — I10 Essential (primary) hypertension: Secondary | ICD-10-CM

## 2022-06-04 DIAGNOSIS — M341 CR(E)ST syndrome: Secondary | ICD-10-CM | POA: Diagnosis present

## 2022-06-04 DIAGNOSIS — M1990 Unspecified osteoarthritis, unspecified site: Secondary | ICD-10-CM

## 2022-06-04 DIAGNOSIS — Z006 Encounter for examination for normal comparison and control in clinical research program: Secondary | ICD-10-CM

## 2022-06-04 DIAGNOSIS — Z885 Allergy status to narcotic agent status: Secondary | ICD-10-CM | POA: Diagnosis not present

## 2022-06-04 DIAGNOSIS — M069 Rheumatoid arthritis, unspecified: Secondary | ICD-10-CM | POA: Diagnosis present

## 2022-06-04 DIAGNOSIS — E7800 Pure hypercholesterolemia, unspecified: Secondary | ICD-10-CM | POA: Diagnosis present

## 2022-06-04 HISTORY — DX: Dyspnea, unspecified: R06.00

## 2022-06-04 HISTORY — DX: Presence of prosthetic heart valve: Z95.2

## 2022-06-04 HISTORY — PX: INTRAOPERATIVE TRANSTHORACIC ECHOCARDIOGRAM: SHX6523

## 2022-06-04 HISTORY — PX: TRANSCATHETER AORTIC VALVE REPLACEMENT, TRANSFEMORAL: SHX6400

## 2022-06-04 LAB — POCT I-STAT, CHEM 8
BUN: 18 mg/dL (ref 8–23)
Calcium, Ion: 1.15 mmol/L (ref 1.15–1.40)
Chloride: 103 mmol/L (ref 98–111)
Creatinine, Ser: 0.6 mg/dL (ref 0.44–1.00)
Glucose, Bld: 100 mg/dL — ABNORMAL HIGH (ref 70–99)
HCT: 32 % — ABNORMAL LOW (ref 36.0–46.0)
Hemoglobin: 10.9 g/dL — ABNORMAL LOW (ref 12.0–15.0)
Potassium: 4.2 mmol/L (ref 3.5–5.1)
Sodium: 141 mmol/L (ref 135–145)
TCO2: 28 mmol/L (ref 22–32)

## 2022-06-04 LAB — ECHOCARDIOGRAM LIMITED
AR max vel: 3.4 cm2
AV Area VTI: 2.99 cm2
AV Area mean vel: 3.4 cm2
AV Mean grad: 4 mmHg
AV Peak grad: 8.8 mmHg
Ao pk vel: 1.48 m/s
Calc EF: 44.1 %
Single Plane A2C EF: 37.4 %
Single Plane A4C EF: 46.6 %

## 2022-06-04 LAB — ABO/RH: ABO/RH(D): A POS

## 2022-06-04 SURGERY — IMPLANTATION, AORTIC VALVE, TRANSCATHETER, FEMORAL APPROACH
Anesthesia: Monitor Anesthesia Care

## 2022-06-04 MED ORDER — HEPARIN 30,000 UNITS/1000 ML (OHS) CELLSAVER SOLUTION
Status: DC
  Filled 2022-06-04: qty 1000

## 2022-06-04 MED ORDER — PROPOFOL 500 MG/50ML IV EMUL
INTRAVENOUS | Status: DC | PRN
  Administered 2022-06-04: 15 ug/kg/min via INTRAVENOUS

## 2022-06-04 MED ORDER — MAGNESIUM SULFATE 50 % IJ SOLN
40.0000 meq | INTRAMUSCULAR | Status: DC
  Filled 2022-06-04: qty 9.85

## 2022-06-04 MED ORDER — ATORVASTATIN CALCIUM 10 MG PO TABS
20.0000 mg | ORAL_TABLET | Freq: Every day | ORAL | Status: DC
  Administered 2022-06-04 – 2022-06-05 (×2): 20 mg via ORAL
  Filled 2022-06-04 (×2): qty 2

## 2022-06-04 MED ORDER — CEFAZOLIN SODIUM-DEXTROSE 2-4 GM/100ML-% IV SOLN
2.0000 g | INTRAVENOUS | Status: AC
  Administered 2022-06-04: 2 g via INTRAVENOUS
  Filled 2022-06-04 (×2): qty 100

## 2022-06-04 MED ORDER — FENTANYL CITRATE (PF) 100 MCG/2ML IJ SOLN
INTRAMUSCULAR | Status: AC
  Filled 2022-06-04: qty 2

## 2022-06-04 MED ORDER — NOREPINEPHRINE 4 MG/250ML-% IV SOLN
0.0000 ug/min | INTRAVENOUS | Status: DC
  Filled 2022-06-04: qty 250

## 2022-06-04 MED ORDER — ONDANSETRON HCL 4 MG/2ML IJ SOLN: 4.0000 mg | Freq: Four times a day (QID) | INTRAMUSCULAR | Status: DC | PRN

## 2022-06-04 MED ORDER — CHLORHEXIDINE GLUCONATE 4 % EX LIQD: 60.0000 mL | Freq: Once | CUTANEOUS | Status: DC

## 2022-06-04 MED ORDER — CHLORHEXIDINE GLUCONATE 4 % EX LIQD: 30.0000 mL | CUTANEOUS | Status: DC

## 2022-06-04 MED ORDER — NOREPINEPHRINE 4 MG/250ML-% IV SOLN
INTRAVENOUS | Status: DC | PRN
  Administered 2022-06-04: 2 ug/min via INTRAVENOUS

## 2022-06-04 MED ORDER — PHENYLEPHRINE HCL (PRESSORS) 10 MG/ML IV SOLN
INTRAVENOUS | Status: DC | PRN
  Administered 2022-06-04: 40 ug via INTRAVENOUS

## 2022-06-04 MED ORDER — DEXMEDETOMIDINE HCL IN NACL 400 MCG/100ML IV SOLN
0.1000 ug/kg/h | INTRAVENOUS | Status: AC
  Administered 2022-06-04: 65.8 ug via INTRAVENOUS
  Filled 2022-06-04: qty 100

## 2022-06-04 MED ORDER — NITROGLYCERIN IN D5W 200-5 MCG/ML-% IV SOLN
0.0000 ug/min | INTRAVENOUS | Status: DC
  Filled 2022-06-04: qty 250

## 2022-06-04 MED ORDER — EPHEDRINE SULFATE (PRESSORS) 50 MG/ML IJ SOLN
INTRAMUSCULAR | Status: DC | PRN
  Administered 2022-06-04: 5 mg via INTRAVENOUS

## 2022-06-04 MED ORDER — PROPOFOL 10 MG/ML IV BOLUS: INTRAVENOUS | Status: DC | PRN

## 2022-06-04 MED ORDER — POTASSIUM CHLORIDE 2 MEQ/ML IV SOLN
80.0000 meq | INTRAVENOUS | Status: DC
  Filled 2022-06-04: qty 40

## 2022-06-04 MED ORDER — SODIUM CHLORIDE 0.9% FLUSH
3.0000 mL | Freq: Two times a day (BID) | INTRAVENOUS | Status: DC
  Administered 2022-06-04 – 2022-06-05 (×2): 3 mL via INTRAVENOUS

## 2022-06-04 MED ORDER — ONDANSETRON HCL 4 MG/2ML IJ SOLN
INTRAMUSCULAR | Status: DC | PRN
  Administered 2022-06-04: 4 mg via INTRAVENOUS

## 2022-06-04 MED ORDER — SODIUM CHLORIDE 0.9 % IV SOLN: 250.0000 mL | INTRAVENOUS | Status: DC

## 2022-06-04 MED ORDER — PROTAMINE SULFATE 10 MG/ML IV SOLN
INTRAVENOUS | Status: AC
  Filled 2022-06-04: qty 20

## 2022-06-04 MED ORDER — HEPARIN (PORCINE) IN NACL 1000-0.9 UT/500ML-% IV SOLN
INTRAVENOUS | Status: DC | PRN
  Administered 2022-06-04 (×3): 500 mL

## 2022-06-04 MED ORDER — SODIUM CHLORIDE 0.9 % IV SOLN: INTRAVENOUS | Status: AC

## 2022-06-04 MED ORDER — ASPIRIN 81 MG PO TBEC
81.0000 mg | DELAYED_RELEASE_TABLET | Freq: Every day | ORAL | Status: DC
  Administered 2022-06-04 – 2022-06-05 (×2): 81 mg via ORAL
  Filled 2022-06-04 (×2): qty 1

## 2022-06-04 MED ORDER — OXYCODONE HCL 5 MG PO TABS: 5.0000 mg | ORAL_TABLET | ORAL | Status: DC | PRN

## 2022-06-04 MED ORDER — HEPARIN SODIUM (PORCINE) 1000 UNIT/ML IJ SOLN
INTRAMUSCULAR | Status: DC | PRN
  Administered 2022-06-04: 10000 [IU] via INTRAVENOUS

## 2022-06-04 MED ORDER — ACETAMINOPHEN 325 MG PO TABS: 650.0000 mg | ORAL_TABLET | Freq: Four times a day (QID) | ORAL | Status: DC | PRN

## 2022-06-04 MED ORDER — SODIUM CHLORIDE 0.9 % IV SOLN: INTRAVENOUS | Status: DC

## 2022-06-04 MED ORDER — PILOCARPINE HCL 5 MG PO TABS
5.0000 mg | ORAL_TABLET | Freq: Two times a day (BID) | ORAL | Status: DC
  Administered 2022-06-04 – 2022-06-05 (×3): 5 mg via ORAL
  Filled 2022-06-04 (×3): qty 1

## 2022-06-04 MED ORDER — LIDOCAINE HCL (PF) 1 % IJ SOLN
INTRAMUSCULAR | Status: DC | PRN
  Administered 2022-06-04 (×2): 12 mL

## 2022-06-04 MED ORDER — SODIUM CHLORIDE 0.9% FLUSH: 3.0000 mL | INTRAVENOUS | Status: DC | PRN

## 2022-06-04 MED ORDER — CHLORHEXIDINE GLUCONATE 0.12 % MT SOLN
15.0000 mL | Freq: Once | OROMUCOSAL | Status: AC
  Administered 2022-06-04: 15 mL via OROMUCOSAL
  Filled 2022-06-04 (×2): qty 15

## 2022-06-04 MED ORDER — CEFAZOLIN SODIUM-DEXTROSE 2-4 GM/100ML-% IV SOLN
2.0000 g | Freq: Three times a day (TID) | INTRAVENOUS | Status: AC
  Administered 2022-06-04 (×2): 2 g via INTRAVENOUS
  Filled 2022-06-04 (×2): qty 100

## 2022-06-04 MED ORDER — LACTATED RINGERS IV SOLN: INTRAVENOUS | Status: DC

## 2022-06-04 MED ORDER — FENTANYL CITRATE (PF) 100 MCG/2ML IJ SOLN
INTRAMUSCULAR | Status: DC | PRN
  Administered 2022-06-04: 50 ug via INTRAVENOUS

## 2022-06-04 MED ORDER — LIDOCAINE HCL (PF) 1 % IJ SOLN
INTRAMUSCULAR | Status: AC
  Filled 2022-06-04: qty 30

## 2022-06-04 MED ORDER — HYDROXYCHLOROQUINE SULFATE 200 MG PO TABS
200.0000 mg | ORAL_TABLET | Freq: Two times a day (BID) | ORAL | Status: DC
  Administered 2022-06-04 – 2022-06-05 (×3): 200 mg via ORAL
  Filled 2022-06-04 (×3): qty 1

## 2022-06-04 MED ORDER — ACETAMINOPHEN 650 MG RE SUPP: 650.0000 mg | Freq: Four times a day (QID) | RECTAL | Status: DC | PRN

## 2022-06-04 MED ORDER — PROTAMINE SULFATE 10 MG/ML IV SOLN
INTRAVENOUS | Status: DC | PRN
  Administered 2022-06-04: 100 mg via INTRAVENOUS

## 2022-06-04 MED ORDER — IOHEXOL 350 MG/ML SOLN
INTRAVENOUS | Status: DC | PRN
  Administered 2022-06-04: 90 mL via INTRA_ARTERIAL

## 2022-06-04 MED ORDER — SODIUM CHLORIDE 0.9 % IV SOLN: 250.0000 mL | INTRAVENOUS | Status: DC | PRN

## 2022-06-04 SURGICAL SUPPLY — 30 items
BAG SNAP BAND KOVER 36X36 (MISCELLANEOUS) ×2 IMPLANT
CABLE SURGICAL S-101-97-12 (CABLE) IMPLANT
CATH 23 ULTRA DELIVERY (CATHETERS) IMPLANT
CATH INFINITI 5FR ANG PIGTAIL (CATHETERS) IMPLANT
CATH INFINITI 6F AL1 (CATHETERS) IMPLANT
CLOSURE MYNX CONTROL 6F/7F (Vascular Products) IMPLANT
CLOSURE PERCLOSE PROSTYLE (VASCULAR PRODUCTS) IMPLANT
CRIMPER (MISCELLANEOUS) IMPLANT
DEVICE INFLATION ATRION QL2530 (MISCELLANEOUS) IMPLANT
KIT HEART LEFT (KITS) ×1 IMPLANT
PACK CARDIAC CATHETERIZATION (CUSTOM PROCEDURE TRAY) ×1 IMPLANT
SHEATH INTRODUCER SET 20-26 (SHEATH) IMPLANT
SHEATH PINNACLE 6F 10CM (SHEATH) IMPLANT
SHEATH PINNACLE 8F 10CM (SHEATH) IMPLANT
SHEATH PROBE COVER 6X72 (BAG) IMPLANT
STOPCOCK MORSE 400PSI 3WAY (MISCELLANEOUS) ×2 IMPLANT
SYR MEDRAD MARK 7 150ML (SYRINGE) IMPLANT
TRANSDUCER W/STOPCOCK (MISCELLANEOUS) ×2 IMPLANT
TUBING ART PRESS 72  MALE/FEM (TUBING) ×1
TUBING ART PRESS 72 MALE/FEM (TUBING) IMPLANT
TUBING CIL FLEX 10 FLL-RA (TUBING) IMPLANT
TUBING CONTRAST HIGH PRESS 48 (TUBING) IMPLANT
VALV SAP RESI3 ULT 23 RESEARCH (Valve) ×1 IMPLANT
VALVE SAP RESI3 ULT 23 RESERCH (Valve) IMPLANT
WIRE AMPLATZ SS-J .035X180CM (WIRE) IMPLANT
WIRE EMERALD 3MM-J .035X150CM (WIRE) IMPLANT
WIRE EMERALD 3MM-J .035X260CM (WIRE) IMPLANT
WIRE EMERALD ST .035X260CM (WIRE) ×1 IMPLANT
WIRE MICRO SET SILHO 5FR 7 (SHEATH) IMPLANT
WIRE SAFARI SM CURVE 275 (WIRE) IMPLANT

## 2022-06-04 NOTE — Op Note (Signed)
[facility]   MULTIDISCIPLINARY HEART VALVE TEAM   TAVR OPERATIVE NOTE   Date of Procedure:  06/04/2022  Preoperative Diagnosis: Severe Aortic Stenosis   Postoperative Diagnosis: Same   Procedure:   Transcatheter Aortic Valve Replacement - Percutaneous Right Transfemoral Approach  Edwards Sapien 3 Ultra Resilia THV (size 23 mm, model # 9755RSL, serial # [id number])   Co-Surgeons:  Alleen Borne, MD and Alverda Skeans, MD   Anesthesiologist:  R. Sampson Goon, MD  Echocardiographer:  Demetrius Charity. Eden Emms, MD  Pre-operative Echo Findings: Severe aortic stenosis Normal left ventricular systolic function  Post-operative Echo Findings: No paravalvular leak Normal left ventricular systolic function   BRIEF CLINICAL NOTE AND INDICATIONS FOR SURGERY  This 77 year old woman has a history of moderate aortic stenosis in the surveillance arm of the PROGRESS trial and has now developed NYHA class II symptoms of exertional fatigue and shortness of breath as well as exertional dizziness and left shoulder discomfort.  She has had a decrease in her left ventricular ejection fraction to 45 to 50% with a stable mean aortic valve gradient of 24.2 mmHg and a valve area by VTI of 0.87 cm.  Her valve looks like a severely stenotic valve on echocardiogram.  Her progression of symptoms and reduced ejection fraction would suggest development of severe low-flow/low gradient aortic stenosis. Dobutamine stress echo showed and increase in the mean gradient from 23 mm Hg to 46 mm Hg during stress consistent with severe AS.  I agree that aortic valve replacement is indicated in this patient for relief of her symptoms and to prevent further left ventricular deterioration.  Her gated cardiac CTA shows anatomy suitable for TAVR using a 23 mm SAPIEN 3 valve.  Her abdominal and pelvic CTA shows adequate pelvic vascular access to allow transfemoral insertion.  She has been scheduled for a dobutamine stress  echocardiogram on 05/28/2022 to document progression to severe aortic stenosis.   The patient was counseled at length regarding treatment alternatives for management of severe symptomatic aortic stenosis. The risks and benefits of surgical intervention has been discussed in detail. Long-term prognosis with medical therapy was discussed. Alternative approaches such as conventional surgical aortic valve replacement, transcatheter aortic valve replacement, and palliative medical therapy were compared and contrasted at length. This discussion was placed in the context of the patient's own specific clinical presentation and past medical history. All of her questions have been addressed.    Following the decision to proceed with transcatheter aortic valve replacement, a discussion was held regarding what types of management strategies would be attempted intraoperatively in the event of life-threatening complications, including whether or not the patient would be considered a candidate for the use of cardiopulmonary bypass and/or conversion to open sternotomy for attempted surgical intervention.  She is only 77 and still physically active and exercising several days per week and I think she would be a candidate for emergent sternotomy to manage any intraoperative complications.  The patient is aware of the fact that transient use of cardiopulmonary bypass may be necessary. The patient has been advised of a variety of complications that might develop including but not limited to risks of death, stroke, paravalvular leak, aortic dissection or other major vascular complications, aortic annulus rupture, device embolization, cardiac rupture or perforation, mitral regurgitation, acute myocardial infarction, arrhythmia, heart block or bradycardia requiring permanent pacemaker placement, congestive heart failure, respiratory failure, renal failure, pneumonia, infection, other late complications related to structural valve  deterioration or migration, or other complications that might ultimately  cause a temporary or permanent loss of functional independence or other long term morbidity. The patient provides full informed consent for the procedure as described and all questions were answered.     DETAILS OF THE OPERATIVE PROCEDURE  PREPARATION:    The patient was brought to the operating room on the above mentioned date and appropriate monitoring was established by the anesthesia team. The patient was placed in the supine position on the operating table.  Intravenous antibiotics were administered. The patient was monitored closely throughout the procedure under conscious sedation.  General endotracheal anesthesia was induced uneventfully.  Baseline transthoracic echocardiogram was performed. The patient's abdomen and both groins were prepped and draped in a sterile manner. A time out procedure was performed.   PERIPHERAL ACCESS:    Using the modified Seldinger technique, femoral arterial and venous access was obtained with placement of 6 Fr sheaths on the left and right sides respectively.  A pigtail diagnostic catheter was passed through the left arterial sheath under fluoroscopic guidance into the aortic root.  Aortic root angiography was performed in order to determine the optimal angiographic angle for valve deployment.   TRANSFEMORAL ACCESS:   Percutaneous transfemoral access and sheath placement was performed using ultrasound guidance.  The right common femoral artery was cannulated using a micropuncture needle and appropriate location was verified using hand injection angiogram.  A pair of Abbott Perclose percutaneous closure devices were placed and a 6 French sheath replaced into the femoral artery.  The patient was heparinized systemically and ACT verified > 250 seconds.    A 14 Fr transfemoral E-sheath was introduced into the right common femoral artery after progressively dilating over an Amplatz  superstiff wire. An AL-1 catheter was used to direct a straight-tip exchange length wire across the native aortic valve into the left ventricle. This was exchanged out for a pigtail catheter and position was confirmed in the LV apex. Simultaneous LV and Ao pressures were recorded.  The pigtail catheter was exchanged for a Safari wire in the LV apex.   BALLOON AORTIC VALVULOPLASTY:   Not performed    TRANSCATHETER HEART VALVE DEPLOYMENT:   An Edwards Sapien 3 Ultra transcatheter heart valve (size 23 mm) was prepared and crimped per manufacturer's guidelines, and the proper orientation of the valve is confirmed on the [employer] delivery system. The valve was advanced through the introducer sheath using normal technique until in an appropriate position in the abdominal aorta beyond the sheath tip. The balloon was then retracted and using the fine-tuning wheel was centered on the valve. The valve was then advanced across the aortic arch using appropriate flexion of the catheter. The valve was carefully positioned across the aortic valve annulus. The Commander catheter was retracted using normal technique. Once final position of the valve has been confirmed by angiographic assessment, the valve is deployed during rapid ventricular pacing to maintain systolic blood pressure < 50 mmHg and pulse pressure < 10 mmHg. The balloon inflation is held for >3 seconds after reaching full deployment volume. Once the balloon has fully deflated the balloon is retracted into the ascending aorta and valve function is assessed using echocardiography. There is felt to be no paravalvular leak and no central aortic insufficiency.  The patient's hemodynamic recovery following valve deployment is good.  The deployment balloon and guidewire are both removed.    PROCEDURE COMPLETION:   The sheath was removed and femoral artery closure performed.  Protamine was administered once femoral arterial repair was complete. The  temporary pacemaker, pigtail catheter and femoral sheaths were removed with manual pressure used for venous hemostasis.  A Mynx femoral closure device was utilized following removal of the diagnostic sheath in the left femoral artery.  The patient tolerated the procedure well and is transported to the cath lab recovery area in stable condition. There were no immediate intraoperative complications. All sponge instrument and needle counts are verified correct at completion of the operation.   No blood products were administered during the operation.  The patient received a total of 90 mL of intravenous contrast during the procedure.   Alleen BorneBryan K Azaan Leask, MD 06/04/2022

## 2022-06-04 NOTE — Anesthesia Preprocedure Evaluation (Signed)
Anesthesia Evaluation  Patient identified by MRN, date of birth, ID band Patient awake    Reviewed: Allergy & Precautions, NPO status , Patient's Chart, lab work & pertinent test results  Airway Mallampati: II  TM Distance: >3 FB Neck ROM: Full    Dental   Pulmonary neg pulmonary ROS   breath sounds clear to auscultation       Cardiovascular hypertension, Pt. on medications + Valvular Problems/Murmurs AS  Rhythm:Regular Rate:Normal + Systolic murmurs    Neuro/Psych negative neurological ROS     GI/Hepatic Neg liver ROS,GERD  ,,  Endo/Other  negative endocrine ROS    Renal/GU negative Renal ROS     Musculoskeletal  (+) Arthritis ,    Abdominal   Peds  Hematology negative hematology ROS (+)   Anesthesia Other Findings   Reproductive/Obstetrics                             Anesthesia Physical Anesthesia Plan  ASA: 4  Anesthesia Plan: MAC   Post-op Pain Management: Tylenol PO (pre-op)* and Minimal or no pain anticipated   Induction:   PONV Risk Score and Plan: 2 and Propofol infusion, Ondansetron and Treatment may vary due to age or medical condition  Airway Management Planned: Natural Airway and Simple Face Mask  Additional Equipment: Arterial line  Intra-op Plan:   Post-operative Plan:   Informed Consent: I have reviewed the patients History and Physical, chart, labs and discussed the procedure including the risks, benefits and alternatives for the proposed anesthesia with the patient or authorized representative who has indicated his/her understanding and acceptance.       Plan Discussed with: CRNA  Anesthesia Plan Comments:        Anesthesia Quick Evaluation

## 2022-06-04 NOTE — Transfer of Care (Signed)
Immediate Anesthesia Transfer of Care Note  Patient: Regina Tate  Procedure(s) Performed: Transcatheter Aortic Valve Replacement, Transfemoral INTRAOPERATIVE TRANSTHORACIC ECHOCARDIOGRAM  Patient Location: Cath Lab  Anesthesia Type:MAC  Level of Consciousness: drowsy  Airway & Oxygen Therapy: Patient Spontanous Breathing and Patient connected to nasal cannula oxygen  Post-op Assessment: Report given to RN and Post -op Vital signs reviewed and stable  Post vital signs: Reviewed and stable  Last Vitals:  Vitals Value Taken Time  BP 97/59 06/04/22 1150  Temp    Pulse 62 06/04/22 1154  Resp 11 06/04/22 1154  SpO2 99 % 06/04/22 1154  Vitals shown include unvalidated device data.  Last Pain:  Vitals:   06/04/22 0808  TempSrc:   PainSc: 0-No pain         Complications: There were no known notable events for this encounter.

## 2022-06-04 NOTE — Progress Notes (Signed)
°  [facility]   °MULTIDISCIPLINARY HEART VALVE TEAM ° °Patient doing well s/p TAVR. She is hemodynamically stable. Groin sites stable. ECG with no high grade block. Plan to DC arterial line and transfer to 4E.  Plan for early ambulation after bedrest completed and hopeful discharge over the next 24-48 hours.  ° °Laylana Gerwig NP-C °Structural Heart Team  °Pager: [phone] °Phone: [phone] ° ° °

## 2022-06-04 NOTE — Anesthesia Postprocedure Evaluation (Signed)
Anesthesia Post Note  Patient: Akua Grubich Henneman  Procedure(s) Performed: Transcatheter Aortic Valve Replacement, Transfemoral INTRAOPERATIVE TRANSTHORACIC ECHOCARDIOGRAM     Patient location during evaluation: PACU Anesthesia Type: MAC Level of consciousness: awake and alert Pain management: pain level controlled Vital Signs Assessment: post-procedure vital signs reviewed and stable Respiratory status: spontaneous breathing, nonlabored ventilation, respiratory function stable and patient connected to nasal cannula oxygen Cardiovascular status: stable and blood pressure returned to baseline Postop Assessment: no apparent nausea or vomiting Anesthetic complications: no  There were no known notable events for this encounter.  Last Vitals:  Vitals:   06/04/22 1552 06/04/22 1600  BP: (!) 111/53 (!) 118/57  Pulse: 65 67  Resp: 16 13  Temp: 36.6 C   SpO2: 99% 99%    Last Pain:  Vitals:   06/04/22 1600  TempSrc:   PainSc: 0-No pain                 Kennieth Rad

## 2022-06-04 NOTE — Progress Notes (Signed)
  Echocardiogram 2D Echocardiogram has been performed.  Regina Tate 06/04/2022, 11:27 AM

## 2022-06-04 NOTE — Interval H&P Note (Signed)
History and Physical Interval Note:  06/04/2022 6:53 AM  Regina Tate  has presented today for surgery, with the diagnosis of Severe Aortic Stenosis.  The various methods of treatment have been discussed with the patient and family. After consideration of risks, benefits and other options for treatment, the patient has consented to  Procedure(s): Transcatheter Aortic Valve Replacement, Transfemoral (N/A) INTRAOPERATIVE TRANSTHORACIC ECHOCARDIOGRAM (N/A) as a surgical intervention.  The patient's history has been reviewed, patient examined, no change in status, stable for surgery.  I have reviewed the patient's chart and labs.  Questions were answered to the patient's satisfaction.     Alleen Borne

## 2022-06-04 NOTE — Op Note (Signed)
[facility]  [facility] OPERATIVE NOTE   Date of Procedure:  06/04/2022  Preoperative Diagnosis: Severe Aortic Stenosis   Postoperative Diagnosis: Same   Procedure:   Transcatheter Aortic Valve Replacement - Transfemoral Approach  Edwards Sapien 3 Resilia THV (size 23 mm, model # 9755RSL, serial # [id number])    Co-Surgeons:   Alleen Borne, MD and Alverda Skeans, MD  Anesthesiologist:                  Glade Stanford, MD   Echocardiographer:              Burna Forts, MD   Pre-operative Echo Findings: Severe aortic stenosis Normal left ventricular systolic function   Post-operative Echo Findings: No paravalvular leak Normal left ventricular systolic function  Left Heart Catheterization Findings: Left ventricular end-diastolic pressure of   BRIEF CLINICAL NOTE AND INDICATIONS FOR SURGERY  The patient is a 77 year old female with a history of moderate aortic stenosis in the surveillance arm of the PROGRESS trial.  Over the last several months she has developed increasing symptoms and now endorses NYHA class II and [place] cardiovascular Society class II symptoms.  A dobutamine stress echocardiogram suggested severe aortic stenosis.  For this reason after review by the steering committee, she is referred for elective transcatheter arctic valve replacement for symptomatic moderate aortic stenosis in conjunction with a decrement in her left ventricular function.  During the course of the patient's preoperative work up they have been evaluated comprehensively by a multidisciplinary team of specialists coordinated through the [facility] in the [facility].  They have been demonstrated to suffer from symptomatic severe aortic stenosis as noted above. The patient has been counseled extensively as to the relative risks and benefits of all options for the treatment of severe aortic stenosis including long term medical therapy,  conventional surgery for aortic valve replacement, and transcatheter aortic valve replacement.  The patient has been independently evaluated by Dr. Laneta Simmers with CT surgery and they are felt to be at high risk for conventional surgical aortic valve replacement. The surgeon indicated the patient would be a poor candidate for conventional surgery. Based upon review of all of the patient's preoperative diagnostic tests they are felt to be candidate for transcatheter aortic valve replacement using the transfemoral approach as an alternative to high risk conventional surgery.    Following the decision to proceed with transcatheter aortic valve replacement, a discussion has been held regarding what types of management strategies would be attempted intraoperatively in the event of life-threatening complications, including whether or not the patient would be considered a candidate for the use of cardiopulmonary bypass and/or conversion to open sternotomy for attempted surgical intervention.  The patient has been advised of a variety of complications that might develop peculiar to this approach including but not limited to risks of death, stroke, paravalvular leak, aortic dissection or other major vascular complications, aortic annulus rupture, device embolization, cardiac rupture or perforation, acute myocardial infarction, arrhythmia, heart block or bradycardia requiring permanent pacemaker placement, congestive heart failure, respiratory failure, renal failure, pneumonia, infection, other late complications related to structural valve deterioration or migration, or other complications that might ultimately cause a temporary or permanent loss of functional independence or other long term morbidity.  The patient provides full informed consent for the procedure as described and all questions were answered preoperatively.    DETAILS OF THE OPERATIVE PROCEDURE  PREPARATION:   The patient is brought to  the operating room  on the above mentioned date and central monitoring was established by the anesthesia team including placement of a radial arterial line. The patient is placed in the supine position on the operating table.  Intravenous antibiotics are administered. Conscious sedation is used.   Baseline transthoracic echocardiogram was performed. The patient's chest, abdomen, both groins, and both lower extremities are prepared and draped in a sterile manner. A time out procedure is performed.   PERIPHERAL ACCESS:   Using the modified Seldinger technique, femoral arterial and venous access were obtained with placement of a 6 Fr sheath in the artery and a 7 Fr sheath in the vein on the left and right sides using u/s guidance.  A pigtail diagnostic catheter was passed through the femoral arterial sheath under fluoroscopic guidance into the aortic root.  Aortic root angiography was performed in order to determine the optimal angiographic angle for valve deployment.  TRANSFEMORAL ACCESS:  A micropuncture kit was used to gain access to the right femoral artery using u/s guidance. Position confirmed with angiography. Pre-closure with double ProGlide closure devices. The patient was heparinized systemically and ACT verified > 250 seconds.    A 14 Fr transfemoral E-sheath was introduced into the right femoral artery after progressively dilating over an Amplatz superstiff wire. An AL-1 catheter was used to direct a straight-tip exchange length wire across the native aortic valve into the left ventricle. This was exchanged out for a pigtail catheter and position was confirmed in the LV apex. Simultaneous left ventricular, aortic, and left ventricular end-diastolic pressures were recorded.  The pigtail catheter was then exchanged for an Safari wire in the LV apex.  Direct LV pacing thresholds were assessed and found to be adequate.   TRANSCATHETER HEART VALVE DEPLOYMENT:  An Edwards Sapien 3 THV (size 23 mm) was prepared and  crimped per manufacturer's guidelines, and the proper orientation of the valve is confirmed on the [employer] Commander delivery system. The valve was advanced through the introducer sheath using normal technique until in an appropriate position in the abdominal aorta beyond the sheath tip. The balloon was then retracted and using the fine-tuning wheel was centered on the valve. The valve was then advanced across the aortic arch using appropriate flexion of the catheter. The valve was carefully positioned across the aortic valve annulus. The Commander catheter was retracted using normal technique. Once final position of the valve has been confirmed by angiographic assessment, the valve is deployed while temporarily holding ventilation and during rapid ventricular pacing to maintain systolic blood pressure < 50 mmHg and pulse pressure < 10 mmHg. The balloon inflation is held for >3 seconds after reaching full deployment volume. Once the balloon has fully deflated the balloon is retracted into the ascending aorta and valve function is assessed using TTE. There is felt to be no paravalvular leak and no central aortic insufficiency.  The patient's hemodynamic recovery following valve deployment is good.  The deployment balloon and guidewire are both removed. Echo demostrated acceptable post-procedural gradients, stable mitral valve function, and no AI.   PROCEDURE COMPLETION:  The sheath was then removed and closure devices were completed. Protamine was administered once femoral arterial repair was complete. The pigtail catheters and femoral sheaths were removed and manual pressure used for venous hemostasis.    The patient tolerated the procedure well and is transported to the surgical intensive care in stable condition. There were no immediate intraoperative complications. All sponge instrument and needle counts are verified correct at completion  of the operation.   No blood products were administered during the  operation.  The patient received a total of 90 mL of intravenous contrast during the procedure.  Orbie Pyo MD 06/04/2022 3:38 PM

## 2022-06-04 NOTE — Discharge Summary (Incomplete)
[facility]   MULTIDISCIPLINARY HEART VALVE TEAM  Discharge Summary    Patient ID: Regina Tate MRN: [id number]; DOB: 02/18/1946  Admit date: 06/04/2022 Discharge date: 06/04/2022  Primary Care Provider: Ollen Bowl, MD  Primary Cardiologist: Meriam Sprague, MD / Dr. Lynnette Caffey, MD and Dr. Laneta Simmers, MD (TAVR)   Discharge Diagnoses    Principal Problem:   S/P TAVR (transcatheter aortic valve replacement) Active Problems:   Aortic stenosis   Sjogren's disease   Scleroderma   Raynaud's disease   Rheumatoid arthritis  Allergies Allergies  Allergen Reactions   Codeine     Other reaction(s): makes pt the next day feel like she was drugged   Molds & Smuts     Itching eyes, congestion, headache   Pollen Extract Itching    Itching eyes, congestion, headache    Diagnostic Studies/Procedures    TAVR OPERATIVE NOTE     Date of Procedure:                06/04/2022   Preoperative Diagnosis:      Severe Aortic Stenosis    Postoperative Diagnosis:    Same    Procedure:        Transcatheter Aortic Valve Replacement - Percutaneous Right Transfemoral Approach             Edwards Sapien 3 Ultra Resilia THV (size 23 mm, model # 9755RSL, serial # [id number])              Co-Surgeons:                        Alleen Borne, MD and Alverda Skeans, MD     Anesthesiologist:                  Glade Stanford, MD   Echocardiographer:              Burna Forts, MD   Pre-operative Echo Findings: Severe aortic stenosis Normal left ventricular systolic function   Post-operative Echo Findings: No paravalvular leak Normal left ventricular systolic function _____________   Echo ***: completed but pending formal read at the time of discharge   History of Present Illness     Regina Tate is a 77 y.o. female with a history of Sjogren's syndrome, Scleroderma, Raynaud's syndrome, Rheumatoid arthritis on Enbrel, methotrexate, and plaquenil and moderate  aortic stenosis. Randomized to surveillance arm of PROGRESS trial now with drop in EF and worsening symptoms who presented to [facility] on 06/04/22 for planned TAVR.   Ms. Zilberman was initially seen by Dr. [name] in 2018 for possible enrollment into the pulmonary HTN screening program. At that time, she denied any history of cardiac problems with no symptoms. TTE and PFTs at that time without evidence of PAH or pulmonary fibrosis but noted mild aortic stenosis with peak gradient . She was recommended for yearly follow-up.    Echocardiogram from 11/30/19 showed interval progression in AS with severe valvular thickening and calcification, moderate AS with mean gradient and AVA 1.4cm2. LVEF remained normal at 60-65%. Ca score 119. Repeat echo 01/2021 performed for serial monitoring of AS that showed an EF 60-65%, G1DD, trivial MR, mild AR, moderate AS with AVA 0.83cm, mean gradient , Vmax 3.3, DI 0.33.   She then called the office with symptoms of decreased exercise capacity, fatigue, and SOB. At that time, she was referred to the structural heart team and PROGRESS TRIAL assessment for further evaluation.  She randomized to the surveillance arm and was monitored closely.   On follow up evaluation 02/2022 she was having symptoms of dyspnea on exertion and chest pain. Echo at that time with reduced LV function at 45%, mean grad 24.2 mmHg, peak grad 39.1 mmHg, AVA 0.87 cm2, DVI 0.31, SVI 39, BSA: 1.71 m2, and mild AI.   She was then evaluated by the multidisciplinary valve team and felt to have severe, symptomatic aortic stenosis and to be a suitable candidate for TAVR, which was set up for 06/04/22.     Hospital Course    Severe AS: s/p successful TAVR with a *** mm Edwards Sapien 3 THV via the TF *** approach on ***. Post operative echo pending. Groin sites are stable. ECG with NSR and no high grade heart block. Continue ASA and plavix. Discontinue central line and transfer to the floor. Early  mobilization and hopeful discharge home tomorrow.     Sjogren's syndrome  Scleroderma   Raynaud's syndrome   Rheumatoid arthritis: on Enbrel, methotrexate, and plaquenil    Elevated RAISE score: Noted to have a pre-TAVR elevated      Consultants: None    *** _____________  Discharge Vitals Blood pressure (!) 109/57, pulse 63, temperature (!) 97.5 F (36.4 C), temperature source Temporal, resp. rate 17, height 5\' 4"  (1.626 m), weight 65.8 kg, SpO2 95 %.  Filed Weights   06/04/22 0752  Weight: 65.8 kg    *** physical exam  Labs & Radiologic Studies    CBC Recent Labs    06/04/22 1204  HGB 10.9*  HCT 32.0*   Basic Metabolic Panel Recent Labs    16/10/96 1204  NA 141  K 4.2  CL 103  GLUCOSE 100*  BUN 18  CREATININE 0.60   Liver Function Tests No results for input(s): "AST", "ALT", "ALKPHOS", "BILITOT", "PROT", "ALBUMIN" in the last 72 hours. No results for input(s): "LIPASE", "AMYLASE" in the last 72 hours. Cardiac Enzymes No results for input(s): "CKTOTAL", "CKMB", "CKMBINDEX", "TROPONINI" in the last 72 hours. BNP Invalid input(s): "POCBNP" D-Dimer No results for input(s): "DDIMER" in the last 72 hours. Hemoglobin A1C No results for input(s): "HGBA1C" in the last 72 hours. Fasting Lipid Panel No results for input(s): "CHOL", "HDL", "LDLCALC", "TRIG", "CHOLHDL", "LDLDIRECT" in the last 72 hours. Thyroid Function Tests No results for input(s): "TSH", "T4TOTAL", "T3FREE", "THYROIDAB" in the last 72 hours.  Invalid input(s): "FREET3" _____________  Structural Heart Procedure  Result Date: 06/04/2022 See surgical note for result.  ECHOCARDIOGRAM LIMITED  Result Date: 06/04/2022    ECHOCARDIOGRAM LIMITED REPORT   Patient Name:   Regina Tate Date of Exam: 06/04/2022 Medical Rec #:  [id number]                 Height:       64.0 in Accession #:    [id number]                Weight:       145.0 lb Date of Birth:  1945/03/27                  BSA:           1.706 m Patient Age:    77 years                  BP:           148/74 mmHg Patient Gender: F  HR:           78 bpm. Exam Location:  Inpatient Procedure: Limited Echo, Cardiac Doppler and Color Doppler Indications:     I35.0 Nonrheumatic aortic (valve) stenosis  History:         Patient has prior history of Echocardiogram examinations, most                  recent 05/08/2022. Aortic Valve Disease. Aortic stenosis.                  Aortic Valve: 23 mm Sapien prosthetic, stented (TAVR) valve is                  present in the aortic position. Procedure Date: 06/04/2022.  Sonographer:     Sheralyn Boatman RDCS Referring Phys:  [id number] Charlies Constable [facility] Diagnosing Phys: Charlton Haws MD IMPRESSIONS  1. Left ventricular ejection fraction, by estimation, is 55 to 60%. The left ventricle has normal function.  2. The mitral valve is abnormal. Mild mitral valve regurgitation.  3. Pre TAVR: not well seen tri leaflet calcified mean gradient 18 peak 31 mmHg with mild AR AVA/BSA 1.2 cm2         Post TAVR: well positioned 23 mm Sapien 3 valve no significant PVL mean gradient 3 peak 8 mmhg AVA 3.3 cm2 . The aortic valve has been repaired/replaced. There is a 23 mm Sapien prosthetic (TAVR) valve present in the aortic position. Procedure Date: 06/04/2022. FINDINGS  Left Ventricle: Left ventricular ejection fraction, by estimation, is 55 to 60%. The left ventricle has normal function. The left ventricular internal cavity size was normal in size. Pericardium: There is no evidence of pericardial effusion. Mitral Valve: The mitral valve is abnormal. There is mild thickening of the mitral valve leaflet(s). There is mild calcification of the mitral valve leaflet(s). Mild mitral valve regurgitation. Tricuspid Valve: Tricuspid valve regurgitation is trivial. Aortic Valve: Pre TAVR: not well seen tri leaflet calcified mean gradient 18 peak 31 mmHg with mild AR AVA/BSA 1.2 cm2 Post TAVR: well positioned 23 mm Sapien 3 valve  no significant PVL mean gradient 3 peak 8 mmhg AVA 3.3 cm2. The aortic valve has been repaired/replaced. Aortic valve mean gradient measures 4.0 mmHg. Aortic valve peak gradient measures 8.8 mmHg. Aortic valve area, by VTI measures 2.99 cm. There is a 23 mm Sapien prosthetic, stented (TAVR) valve present in the aortic position. Procedure Date: 06/04/2022. Additional Comments: Spectral Doppler performed. Color Doppler performed.  LEFT VENTRICLE PLAX 2D LVOT diam:     2.30 cm LV SV:         107 LV SV Index:   63 LVOT Area:     4.15 cm  LV Volumes (MOD) LV vol d, MOD A2C: 54.5 ml LV vol d, MOD A4C: 67.6 ml LV vol s, MOD A2C: 34.1 ml LV vol s, MOD A4C: 36.1 ml LV SV MOD A2C:     20.4 ml LV SV MOD A4C:     67.6 ml LV SV MOD BP:      28.7 ml AORTIC VALVE AV Area (Vmax):    3.40 cm AV Area (Vmean):   3.40 cm AV Area (VTI):     2.99 cm AV Vmax:           148.00 cm/s AV Vmean:          92.500 cm/s AV VTI:            0.359 m AV Peak Grad:  8.8 mmHg AV Mean Grad:      4.0 mmHg LVOT Vmax:         121.00 cm/s LVOT Vmean:        75.700 cm/s LVOT VTI:          0.258 m LVOT/AV VTI ratio: 0.72  SHUNTS Systemic VTI:  0.26 m Systemic Diam: 2.30 cm Charlton HawsPeter Nishan MD Electronically signed by Charlton HawsPeter Nishan MD Signature Date/Time: 06/04/2022/11:38:48 AM    Final    DG Chest 2 View  Result Date: 06/03/2022 CLINICAL DATA:  Preop heart surgery EXAM: CHEST - 2 VIEW COMPARISON:  CTA chest dated 05/14/2022 FINDINGS: Lungs are clear.  No pleural effusion or pneumothorax. The heart is normal in size. Visualized osseous structures are within normal limits. IMPRESSION: Normal chest radiographs. Electronically Signed   By: Charline BillsSriyesh  Krishnan M.D.   On: 06/03/2022 01:12   ECHOCARDIOGRAM STRESS TEST  Result Date: 05/28/2022     DOBUTAMINE STRESS ECHO REPORT    -------------------------------------------------------------------------------- Patient Name:   Chesley MiresSusan Westlake Harvie Date of Exam: 05/28/2022 Medical Rec #:  [id number]                  Height:       64.0 in Accession #:    [id number]                Weight:       145.0 lb Date of Birth:  02/23/1946                  BSA:          1.706 m Patient Age:    76 years                  BP:           138/95 mmHg Patient Gender: F                         HR:           72 bpm. Exam Location:  [address] Procedure: 2D Echo, Dobutamine Stress, Limited Color Doppler and Color Doppler Indications:    I35.0 Aortic stenosis  History:        Patient has no prior history of Echocardiogram examinations.  Sonographer:    Chanetta MarshallEdwina Douglas BA, RDCS Referring Phys: AMY B TERRELL IMPRESSIONS  1. The findings of the study demonstrate that true severe aortic stenosis is present. 2. Dobutaine stress echo non diagnostic for ischemia due to study was done only to assess degree of aortic stenosis,             3  2. This is a high risk study. FINDINGS Exam Protocol: Dobutamine was infused in increasing doses starting with 5 mcg/kg/min and ending with 20 mcg/kg/min.  Patient Performance: The baseline heart rate was 76 bpm. The heart rate at peak stress was 141 bpm. The target heart rate was calculated to be 123 bpm. The percentage of maximum predicted heart rate achieved was 97.2 %. The baseline blood pressure was 138/65 mmHg. The blood pressure at peak stress was 157/75 mmHg. The blood pressure response was Bp dropped from 157/1075mmHg during during 5mcg of dobutamine to 129/4066mmHg on 20mcg of Dobutamine. The patient developed no symptoms during the stress exam.  EKG: Resting EKG showed normal sinus rhythm with no abnormal findings. The patient developed frequent premature ventricular contractions during exercise.  2D Echo Findings: The baseline ejection fraction was 60%. The peak  ejection fraction at stress was 80%. Baseline regional wall motion abnormalities were not present. This is an inconclusive stress echocardiogram for ischemia.  Stress Doppler: Aortic Valve: Baseline hemodynamics were: aortic valve V max 3.1 m/s, aortic  valve mean gradient mmHG, . Stress hemodynamics demonstrated: aortic valve V max 4.4 m/s, aortic valve mean gradient mmHG, . The findings of the study demonstrate that true severe aortic stenosis is present.  Armanda Magic MD Electronically signed on 05/28/2022 at 4:20:12 PM    Final    CT CORONARY MORPH W/CTA COR W/SCORE W/CA W/CM &/OR WO/CM  Addendum Date: 05/14/2022   ADDENDUM REPORT: 05/14/2022 18:41 CLINICAL DATA:  Aortic Valve pathology with assessment for TAVR EXAM: Cardiac TAVR CT TECHNIQUE: The patient was scanned on a Siemens Force 192 slice scanner. A 120 kV retrospective scan was triggered in the descending thoracic aorta at 111 HU's. Gantry rotation speed was 270 msecs and collimation was .9 mm. No beta blockade or nitro were given. The 3D data set was reconstructed in 5% intervals of the R-R cycle. Systolic and diastolic phases were analyzed on a dedicated work station using MPR, MIP and VRT modes. The patient received 100 cc of contrast. FINDINGS: Aortic Valve: Isolated calcification and thickening of the non coronary cusp leaflet of the aortic valve with reduced excursion of this leaflet; the planimeter valve area is 1.30 Sq cm consistent with moderate aortic stenosis Number of leaflets: Three LVOT calcification: None Annular calcification: None Aortic Valve Calcium Score: 1007 Perimembranous septal diameter: 5 mm Mitral Valve: No calcifications Aortic Annulus Measurements- Systole 30 % Major annulus diameter: 26 mm Minor annulus diameter: 21 mm Annular perimeter: 72 mm Annular area: 3.88 cm2 LVOT area: 3.68 cm2 Aortic Annulus Measurements- Diastole 70% Major annulus diameter: 26 mm Minor annulus diameter: 18 mm Annular perimeter: 69 mm Annular area: 3.56 cm2 LVOT area: 3.47 cm2 Aortic Root Measurements Sinotubular Junction: 28 mm Ascending Thoracic Aorta: 35 mm Aortic Arch: 26 mm Descending Thoracic Aorta: 23 mm Sinus of Valsalva Measurements: Right coronary cusp width: 28 mm Left  coronary cusp width: 28 mm Non coronary cusp width: 28 mm Coronary Artery Height above Annulus: Left Main: 15 mm Left SoV height: 19 mm Right Coronary: 15 mm Right SoV height: 19 mm Non SoV height: 19 mm Optimum Fluoroscopic Angle for Delivery: LAO 17 CAU 10 Valves for structural team consideration: 23 mm Sapien Valve Non TAVR Valve Findings: Coronary Arteries: Normal coronary origin. Study not completed with nitroglycerin. Coronary Calcium Score: Left main: 0 Left anterior descending artery: 310 Left circumflex artery: 41 Right coronary artery: 77 Total: 428 Percentile: 85th for age, sex, and race matched control. Systemic veins: Normal anatomy Main Pulmonary artery: Normal caliber Pulmonary veins: Normal anatomy Left atrial appendage: Patent Interatrial septum: No clear communications Left ventricle: Normal size Left atrium: Normal size Right ventricle: Normal size Right atrium: Normal size Pericardium: No calcifications Extra Cardiac Findings as per separate reporting. Notable artifacts: None IMPRESSION: 1. Moderate Aortic stenosis. Findings pertinent to TAVR procedure are detailed above. 2. Similar valve findings from 2023 study. No change in valve sizing recommendation. RECOMMENDATIONS: The proposed cut-off value of 1,651 AU yielded a 93 % sensitivity and 75 % specificity in grading AS severity in patients with classical low-flow, low-gradient AS. Proposed different cut-off values to define severe AS for men and women as 2,065 AU and 1,274 AU, respectively. The joint European and American recommendations for the assessment of AS consider the aortic valve calcium score as a continuum -  a very high calcium score suggests severe AS and a low calcium score suggests severe AS is unlikely. Sunday Shams, et al. 2017 ESC/EACTS Guidelines for the management of valvular heart disease. Eur Heart J [phone] Coronary artery calcium (CAC) score is a strong predictor of incident coronary heart disease  (CHD) and provides predictive information beyond traditional risk factors. CAC scoring is reasonable to use in the decision to withhold, postpone, or initiate statin therapy in intermediate-risk or selected borderline-risk asymptomatic adults (age 53-75 years and LDL-C >=70 to <190 mg/dL) who do not have diabetes or established atherosclerotic cardiovascular disease (ASCVD).* In intermediate-risk (10-year ASCVD risk >=7.5% to <20%) adults or selected borderline-risk (10-year ASCVD risk >=5% to <7.5%) adults in whom a CAC score is measured for the purpose of making a treatment decision the following recommendations have been made: If CAC = 0, it is reasonable to withhold statin therapy and reassess in 5 to 10 years, as long as higher risk conditions are absent (diabetes mellitus, family history of premature CHD in first degree relatives (males <55 years; females <65 years), cigarette smoking, LDL >=190 mg/dL or other independent risk factors). If CAC is 1 to 99, it is reasonable to initiate statin therapy for patients >=45 years of age. If CAC is >=100 or >=75th percentile, it is reasonable to initiate statin therapy at any age. Cardiology referral should be considered for patients with CAC scores >=400 or >=75th percentile. *2018 AHA/ACC/AACVPR/AAPA/ABC/ACPM/ADA/AGS/APhA/ASPC/NLA/PCNA Guideline on the Management of Blood Cholesterol: A Report of the American College of Cardiology/American Heart Association Task Force on Clinical Practice Guidelines. J Am Coll Cardiol. 2019;73(24):3168-3209. Mahesh  Chandrasekhar Electronically Signed   By: Riley Lam M.D.   On: 05/14/2022 18:41   Result Date: 05/14/2022 EXAM: OVER-READ INTERPRETATION  CT CHEST The following report is a limited chest CT over-read performed by radiologist Dr. Allegra Lai of [facility] on 05/14/2022. This over-read does not include interpretation of cardiac or coronary anatomy or pathology. The cardiac TAVR interpretation  by the cardiologist is attached. COMPARISON:  None Available. FINDINGS: Extracardiac findings will be described separately under dictation for contemporaneously obtained CTA chest, abdomen and pelvis. IMPRESSION: Please see separate dictation for contemporaneously obtained CTA chest, abdomen and pelvis dated 05/14/2022 for full description of relevant extracardiac findings. Electronically Signed: By: Allegra Lai M.D. On: 05/14/2022 14:38   CT ANGIO ABDOMEN PELVIS  W &/OR WO CONTRAST  Result Date: 05/14/2022 CLINICAL DATA:  Preop evaluation for aortic valve replacement EXAM: CT ANGIOGRAPHY CHEST, ABDOMEN AND PELVIS TECHNIQUE: Non-contrast CT of the chest was initially obtained. Multidetector CT imaging through the chest, abdomen and pelvis was performed using the standard protocol during bolus administration of intravenous contrast. Multiplanar reconstructed images and MIPs were obtained and reviewed to evaluate the vascular anatomy. RADIATION DOSE REDUCTION: This exam was performed according to the departmental dose-optimization program which includes automated exposure control, adjustment of the mA and/or kV according to patient size and/or use of iterative reconstruction technique. CONTRAST:  OMNIPAQUE IOHEXOL 350 MG/ML SOLN COMPARISON:  None Available. FINDINGS: CTA CHEST FINDINGS Cardiovascular: Normal heart size. Pericardial effusion. Aortic valve thickening and calcifications. Normal caliber thoracic aorta with mild atherosclerotic disease standard three-vessel aortic arch with no significant stenosis. Three vessel coronary artery disease. Mediastinum/Nodes: Esophagus and thyroid unremarkable. No pathologically enlarged lymph nodes seen in the chest. Lungs/Pleura: Central airways are patent. No consolidation, pleural effusion or pneumothorax. Musculoskeletal: No chest wall abnormality. No acute or significant osseous findings. CTA  ABDOMEN AND PELVIS FINDINGS Hepatobiliary: No focal liver  abnormality is seen. No gallstones, gallbladder wall thickening, or biliary dilatation. Pancreas: Unremarkable. No pancreatic ductal dilatation or surrounding inflammatory changes. Spleen: Normal in size without focal abnormality. Adrenals/Urinary Tract: Adrenal glands are unremarkable. Kidneys are normal, without renal calculi, focal lesion, or hydronephrosis. Bladder is unremarkable. Stomach/Bowel: Stomach is within normal limits. Appendix appears normal. No evidence of bowel wall thickening, distention, or inflammatory changes. Vascular/lymphatic: Normal caliber abdominal aorta with mild atherosclerotic disease. Significant stenosis. No enlarged lymph nodes seen in the abdomen or pelvis. Reproductive: Uterus and bilateral adnexa are unremarkable. Other: No abdominal wall hernia or abnormality. No abdominopelvic ascites. Musculoskeletal: No acute or significant osseous findings. VASCULAR MEASUREMENTS PERTINENT TO TAVR: AORTA: Minimal Aortic Diameter -  14.1 mm Severity of Aortic Calcification-mild RIGHT PELVIS: Right Common Iliac Artery - Minimal Diameter-8.0 mm Tortuosity-mild Calcification-moderate Right External Iliac Artery - Minimal Diameter-7.4 mm Tortuosity-moderate Calcification-none Right Common Femoral Artery - Minimal Diameter-5.8 mm Tortuosity-none Calcification-mild LEFT PELVIS: Left Common Iliac Artery - Minimal Diameter-7.4 mm Tortuosity-mild Calcification-moderate Left External Iliac Artery - Minimal Diameter-6.8 mm Tortuosity-mild Calcification-none Left Common Femoral Artery - Minimal Diameter-6.1 mm Tortuosity-none Calcification-moderate Review of the MIP images confirms the above findings. IMPRESSION: 1. Vascular findings and measurements pertinent to potential TAVR procedure, as detailed above. 2. Thickening and calcification of the aortic valve, compatible with reported clinical history of aortic stenosis. 3. 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 Three vessel coronary artery disease.  Electronically Signed   By: Allegra Lai M.D.   On: 05/14/2022 12:26   CT ANGIO CHEST AORTA W/CM & OR WO/CM  Result Date: 05/14/2022 CLINICAL DATA:  Preop evaluation for aortic valve replacement EXAM: CT ANGIOGRAPHY CHEST, ABDOMEN AND PELVIS TECHNIQUE: Non-contrast CT of the chest was initially obtained. Multidetector CT imaging through the chest, abdomen and pelvis was performed using the standard protocol during bolus administration of intravenous contrast. Multiplanar reconstructed images and MIPs were obtained and reviewed to evaluate the vascular anatomy. RADIATION DOSE REDUCTION: This exam was performed according to the departmental dose-optimization program which includes automated exposure control, adjustment of the mA and/or kV according to patient size and/or use of iterative reconstruction technique. CONTRAST:  OMNIPAQUE IOHEXOL 350 MG/ML SOLN COMPARISON:  None Available. FINDINGS: CTA CHEST FINDINGS Cardiovascular: Normal heart size. Pericardial effusion. Aortic valve thickening and calcifications. Normal caliber thoracic aorta with mild atherosclerotic disease standard three-vessel aortic arch with no significant stenosis. Three vessel coronary artery disease. Mediastinum/Nodes: Esophagus and thyroid unremarkable. No pathologically enlarged lymph nodes seen in the chest. Lungs/Pleura: Central airways are patent. No consolidation, pleural effusion or pneumothorax. Musculoskeletal: No chest wall abnormality. No acute or significant osseous findings. CTA ABDOMEN AND PELVIS FINDINGS Hepatobiliary: No focal liver abnormality is seen. No gallstones, gallbladder wall thickening, or biliary dilatation. Pancreas: Unremarkable. No pancreatic ductal dilatation or surrounding inflammatory changes. Spleen: Normal in size without focal abnormality. Adrenals/Urinary Tract: Adrenal glands are unremarkable. Kidneys are normal, without renal calculi, focal lesion, or hydronephrosis. Bladder is unremarkable.  Stomach/Bowel: Stomach is within normal limits. Appendix appears normal. No evidence of bowel wall thickening, distention, or inflammatory changes. Vascular/lymphatic: Normal caliber abdominal aorta with mild atherosclerotic disease. Significant stenosis. No enlarged lymph nodes seen in the abdomen or pelvis. Reproductive: Uterus and bilateral adnexa are unremarkable. Other: No abdominal wall hernia or abnormality. No abdominopelvic ascites. Musculoskeletal: No acute or significant osseous findings. VASCULAR MEASUREMENTS PERTINENT TO TAVR: AORTA: Minimal Aortic Diameter -  14.1 mm Severity of Aortic Calcification-mild RIGHT PELVIS: Right Common Iliac Artery -  Minimal Diameter-8.0 mm Tortuosity-mild Calcification-moderate Right External Iliac Artery - Minimal Diameter-7.4 mm Tortuosity-moderate Calcification-none Right Common Femoral Artery - Minimal Diameter-5.8 mm Tortuosity-none Calcification-mild LEFT PELVIS: Left Common Iliac Artery - Minimal Diameter-7.4 mm Tortuosity-mild Calcification-moderate Left External Iliac Artery - Minimal Diameter-6.8 mm Tortuosity-mild Calcification-none Left Common Femoral Artery - Minimal Diameter-6.1 mm Tortuosity-none Calcification-moderate Review of the MIP images confirms the above findings. IMPRESSION: 1. Vascular findings and measurements pertinent to potential TAVR procedure, as detailed above. 2. Thickening and calcification of the aortic valve, compatible with reported clinical history of aortic stenosis. 3. 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 Three vessel coronary artery disease. Electronically Signed   By: Allegra Lai M.D.   On: 05/14/2022 12:26   ECHOCARDIOGRAM COMPLETE  Result Date: 05/08/2022    ECHOCARDIOGRAM REPORT   Patient Name:   TONA QUALLEY Date of Exam: 05/08/2022 Medical Rec #:  [id number]                 Height:       64.0 in Accession #:    [id number]                Weight:       146.8 lb Date of Birth:  08-Jan-1946                   BSA:          1.715 m Patient Age:    76 years                  BP:           138/77 mmHg Patient Gender: F                         HR:           76 bpm. Exam Location:  [address] Procedure: 2D Echo, Cardiac Doppler, Color Doppler and Strain Analysis Indications:    I35.0 Aortic Stenosis  History:        Patient has prior history of Echocardiogram examinations, most                 recent 03/18/2022. Risk Factors:HLD. No cardiac history.  Sonographer:    Clearence Ped RCS Referring Phys: [id number] Orbie Pyo IMPRESSIONS  1. Left ventricular ejection fraction, by estimation, is 45 to 50%. The left ventricle has mildly decreased function. The left ventricle has no regional wall motion abnormalities. Left ventricular diastolic parameters are indeterminate. The average left  ventricular global longitudinal strain is -19.4 %. The global longitudinal strain is normal.  2. Right ventricular systolic function is normal. The right ventricular size is normal. There is normal pulmonary artery systolic pressure.  3. The mitral valve is normal in structure. Trivial mitral valve regurgitation. No evidence of mitral stenosis.  4. The aortic valve is tricuspid. There is moderate calcification of the aortic valve. There is moderate thickening of the aortic valve. Aortic valve regurgitation is mild. Moderate aortic valve stenosis. Aortic regurgitation PHT measures 479 msec. Aortic valve area, by VTI measures 0.87 cm. Aortic valve mean gradient measures 24.2 mmHg. Aortic valve Vmax measures 3.13 m/s.  5. The inferior vena cava is normal in size with greater than 50% respiratory variability, suggesting right atrial pressure of 3 mmHg. FINDINGS  Left Ventricle: Left ventricular ejection fraction, by estimation, is 45 to 50%. The left ventricle has mildly decreased function. The left ventricle has no regional wall motion abnormalities. The average left ventricular  global longitudinal strain is -19.4 %. The global longitudinal  strain is normal. The left ventricular internal cavity size was normal in size. There is no left ventricular hypertrophy. Left ventricular diastolic parameters are indeterminate. Right Ventricle: The right ventricular size is normal. No increase in right ventricular wall thickness. Right ventricular systolic function is normal. There is normal pulmonary artery systolic pressure. The tricuspid regurgitant velocity is 1.37 m/s, and  with an assumed right atrial pressure of 3 mmHg, the estimated right ventricular systolic pressure is 10.5 mmHg. Left Atrium: Left atrial size was normal in size. Right Atrium: Right atrial size was normal in size. Pericardium: There is no evidence of pericardial effusion. Mitral Valve: The mitral valve is normal in structure. Trivial mitral valve regurgitation. No evidence of mitral valve stenosis. Tricuspid Valve: The tricuspid valve is normal in structure. Tricuspid valve regurgitation is trivial. No evidence of tricuspid stenosis. Aortic Valve: The aortic valve is tricuspid. There is moderate calcification of the aortic valve. There is moderate thickening of the aortic valve. Aortic valve regurgitation is mild. Aortic regurgitation PHT measures 479 msec. Moderate aortic stenosis is present. Aortic valve mean gradient measures 24.2 mmHg. Aortic valve peak gradient measures 39.1 mmHg. Aortic valve area, by VTI measures 0.87 cm. Pulmonic Valve: The pulmonic valve was normal in structure. Pulmonic valve regurgitation is trivial. No evidence of pulmonic stenosis. Aorta: The aortic root is normal in size and structure. Venous: The inferior vena cava is normal in size with greater than 50% respiratory variability, suggesting right atrial pressure of 3 mmHg. IAS/Shunts: No atrial level shunt detected by color flow Doppler.  LEFT VENTRICLE PLAX 2D LVIDd:         4.80 cm   Diastology LVIDs:         3.70 cm   LV e' medial:    6.20 cm/s LV PW:         0.80 cm   LV E/e' medial:  11.0 LV IVS:         0.90 cm   LV e' lateral:   10.00 cm/s LVOT diam:     1.90 cm   LV E/e' lateral: 6.8 LV SV:         66 LV SV Index:   39        2D Longitudinal Strain LVOT Area:     2.84 cm  2D Strain GLS (A2C):   -22.1 %                          2D Strain GLS (A3C):   -18.0 %                          2D Strain GLS (A4C):   -18.1 %                          2D Strain GLS Avg:     -19.4 % RIGHT VENTRICLE RV Basal diam:  3.20 cm RV S prime:     10.30 cm/s TAPSE (M-mode): 1.8 cm RVSP:           10.5 mmHg LEFT ATRIUM             Index        RIGHT ATRIUM           Index LA diam:        3.40 cm 1.98 cm/m   RA  Pressure: 3.00 mmHg LA Vol (A2C):   50.7 ml 29.56 ml/m  RA Area:     12.20 cm LA Vol (A4C):   31.5 ml 18.36 ml/m  RA Volume:   27.30 ml  15.91 ml/m LA Biplane Vol: 43.3 ml 25.24 ml/m  AORTIC VALVE AV Area (Vmax):    0.91 cm AV Area (Vmean):   0.85 cm AV Area (VTI):     0.87 cm AV Vmax:           312.60 cm/s AV Vmean:          232.800 cm/s AV VTI:            0.765 m AV Peak Grad:      39.1 mmHg AV Mean Grad:      24.2 mmHg LVOT Vmax:         100.50 cm/s LVOT Vmean:        70.150 cm/s LVOT VTI:          0.234 m LVOT/AV VTI ratio: 0.31 AI PHT:            479 msec  AORTA Ao Root diam: 3.20 cm Ao Asc diam:  3.50 cm MITRAL VALVE               TRICUSPID VALVE MV Area (PHT):             TR Peak grad:   7.5 mmHg MV Decel Time:             TR Vmax:        137.00 cm/s MV E velocity: 68.10 cm/s  Estimated RAP:  3.00 mmHg MV A velocity: 73.40 cm/s  RVSP:           10.5 mmHg MV E/A ratio:  0.93                            SHUNTS                            Systemic VTI:  0.23 m                            Systemic Diam: 1.90 cm Chilton Si MD Electronically signed by Chilton Si MD Signature Date/Time: 05/08/2022/5:12:14 PM    Final    Disposition   Pt is being discharged home today in good condition.  Follow-up Plans & Appointments     Follow-up Information     Filbert Schilder, NP Follow up on 06/12/2022.   Specialty:  Cardiology Why: @ 1:30pm. Please arrive at 1:15pm Contact information: [address] [place] [address] [phone]                  Discharge Medications   Allergies as of 06/04/2022       Reactions   Codeine    Other reaction(s): makes pt the next day feel like she was drugged   Molds & Smuts    Itching eyes, congestion, headache   Pollen Extract Itching   Itching eyes, congestion, headache     Med Rec must be completed prior to using this SMARTLINK***           Outstanding Labs/Studies   ***  Duration of Discharge Encounter   Greater than 30 minutes including physician time.  SignedGeorgie Chard, NP 06/04/2022, 3:32 PM [phone]

## 2022-06-04 NOTE — Progress Notes (Signed)
Pt arrived from ...cath.., A/ox 4...pt denies any pain, MD aware,CCMD called. CHG bath given,no further needs at this time   

## 2022-06-04 NOTE — Anesthesia Procedure Notes (Signed)
Procedure Name: MAC Date/Time: 06/04/2022 10:06 AM  Performed by: Cheree Ditto, CRNAPre-anesthesia Checklist: Emergency Drugs available, Suction available and Patient being monitored Patient Re-evaluated:Patient Re-evaluated prior to induction Oxygen Delivery Method: Simple face mask Induction Type: IV induction

## 2022-06-05 ENCOUNTER — Inpatient Hospital Stay (HOSPITAL_COMMUNITY): Payer: Medicare Other

## 2022-06-05 DIAGNOSIS — I5030 Unspecified diastolic (congestive) heart failure: Secondary | ICD-10-CM

## 2022-06-05 DIAGNOSIS — I350 Nonrheumatic aortic (valve) stenosis: Secondary | ICD-10-CM

## 2022-06-05 DIAGNOSIS — Z952 Presence of prosthetic heart valve: Secondary | ICD-10-CM | POA: Diagnosis not present

## 2022-06-05 DIAGNOSIS — I10 Essential (primary) hypertension: Secondary | ICD-10-CM | POA: Diagnosis not present

## 2022-06-05 DIAGNOSIS — M341 CR(E)ST syndrome: Secondary | ICD-10-CM | POA: Diagnosis not present

## 2022-06-05 DIAGNOSIS — Z006 Encounter for examination for normal comparison and control in clinical research program: Secondary | ICD-10-CM | POA: Diagnosis not present

## 2022-06-05 LAB — BASIC METABOLIC PANEL
Anion gap: 9 (ref 5–15)
BUN: 12 mg/dL (ref 8–23)
CO2: 26 mmol/L (ref 22–32)
Calcium: 8.4 mg/dL — ABNORMAL LOW (ref 8.9–10.3)
Chloride: 103 mmol/L (ref 98–111)
Creatinine, Ser: 0.63 mg/dL (ref 0.44–1.00)
GFR, Estimated: >60 mL/min (ref >60)
Glucose, Bld: 109 mg/dL — ABNORMAL HIGH (ref 70–99)
Potassium: 3.9 mmol/L (ref 3.5–5.1)
Sodium: 138 mmol/L (ref 135–145)

## 2022-06-05 LAB — CBC
HCT: 33.8 % — ABNORMAL LOW (ref 36.0–46.0)
Hemoglobin: 10.9 g/dL — ABNORMAL LOW (ref 12.0–15.0)
MCH: 29.5 pg (ref 26.0–34.0)
MCHC: 32.2 g/dL (ref 30.0–36.0)
MCV: 91.6 fL (ref 80.0–100.0)
Platelets: 153 10*3/uL (ref 150–400)
RBC: 3.69 MIL/uL — ABNORMAL LOW (ref 3.87–5.11)
RDW: 15.9 % — ABNORMAL HIGH (ref 11.5–15.5)
WBC: 7.3 10*3/uL (ref 4.0–10.5)
nRBC: 0 % (ref 0.0–0.2)

## 2022-06-05 LAB — MAGNESIUM: Magnesium: 1.8 mg/dL (ref 1.7–2.4)

## 2022-06-05 NOTE — Progress Notes (Signed)
Alert and oriented, verbalized understanding of dc order. Piv dcd. Ccmd notified of order. All belongings and dc paperwork given to patient.

## 2022-06-05 NOTE — Progress Notes (Signed)
  Echocardiogram 2D Echocardiogram has been performed.  Janalyn Harder 06/05/2022, 8:26 AM

## 2022-06-05 NOTE — Progress Notes (Signed)
CARDIAC REHAB PHASE I   PRE:  Rate/Rhythm: 90 SR    BP: sitting 118/60    SpO2:   MODE:  Ambulation: 440 ft   POST:  Rate/Rhythm: 120 ST    BP: sitting 127/68     SpO2: 99 RA  Pt ambulated independently. No c/o. HR elevated somewhat. Discussed restrictions, walking/exercise, and CRPII. Pt receptive. Will refer to [place] CRPII.  [id number]   Ethelda Chick BS, ACSM-CEP 06/05/2022 8:54 AM

## 2022-06-06 ENCOUNTER — Telehealth: Payer: Self-pay | Admitting: Cardiology

## 2022-06-06 NOTE — Telephone Encounter (Signed)
Attempted TOC call today s/p discharge 06/05/22 with no answer.   Georgie Chard NP-C Structural Heart Team  Pager: [phone] Phone: [phone]

## 2022-06-07 LAB — MULTIPLE MYELOMA PANEL, SERUM
Albumin SerPl Elph-Mcnc: 3.5 g/dL (ref 2.9–4.4)
Albumin/Glob SerPl: 2.1 — ABNORMAL HIGH (ref 0.7–1.7)
Alpha 1: 0.2 g/dL (ref 0.0–0.4)
Alpha2 Glob SerPl Elph-Mcnc: 0.4 g/dL (ref 0.4–1.0)
B-Globulin SerPl Elph-Mcnc: 0.7 g/dL (ref 0.7–1.3)
Gamma Glob SerPl Elph-Mcnc: 0.4 g/dL (ref 0.4–1.8)
Globulin, Total: 1.7 g/dL — ABNORMAL LOW (ref 2.2–3.9)
IgA: 102 mg/dL (ref 64–422)
IgG (Immunoglobin G), Serum: 510 mg/dL — ABNORMAL LOW (ref 586–1602)
IgM (Immunoglobulin M), Srm: 91 mg/dL (ref 26–217)
Total Protein ELP: 5.2 g/dL — ABNORMAL LOW (ref 6.0–8.5)

## 2022-06-07 LAB — IMMUNOFIXATION, URINE

## 2022-06-07 NOTE — Telephone Encounter (Signed)
Patient contacted regarding discharge from [facility] on 06/05/2022.  Patient understands to follow up with provider Georgie Chard NP on 06/12/2022 at 1:30 PM at [facility] location. Patient understands discharge instructions? yes Patient understands medications and regiment? yes Patient understands to bring all medications to this visit? Yes  Pt still feels tired but feels this is related to being off her RA medications. Dr Laneta Simmers had advised the pt to remain off of Enbrel for 2 weeks after surgery at her office visit.

## 2022-06-11 NOTE — Progress Notes (Unsigned)
[facility]   MULTIDISCIPLINARY HEART VALVE CLINIC                                     Cardiology Office Note:    Date:  06/13/2022   ID:  Regina Tate, DOB 1946/01/28, MRN [id number]  PCP:  Ollen Bowl, MD  [facility] Cardiologist:  Meriam Sprague, MD/ Dr. Lynnette Caffey, MD (TAVR)  [facility] Electrophysiologist:  None   Referring MD: Ollen Bowl, MD   Chief Complaint  Patient presents with   [facility] s/p TAVR   History of Present Illness:    Regina Tate is a 77 y.o. female with a hx of Sjogren's syndrome, Scleroderma, Raynaud's syndrome, Rheumatoid arthritis on Enbrel, methotrexate, and plaquenil and moderate aortic stenosis. Randomized to surveillance arm of PROGRESS trial found to have a with drop in EF and worsening symptoms who presented to [facility] on 06/04/22 for planned TAVR and is being seen today for TOC follow up.    Regina Tate was initially seen by Dr. [name] in 2018 for possible enrollment into the pulmonary HTN screening program. At that time, she denied any history of cardiac problems with no symptoms. TTE and PFTs at that time without evidence of PAH or pulmonary fibrosis but noted mild aortic stenosis with peak gradient . She was recommended for yearly follow-up.    Echocardiogram from 11/30/19 showed interval progression in AS with severe valvular thickening and calcification, moderate AS with mean gradient and AVA 1.4cm2. LVEF remained normal at 60-65%. Ca score 119. Repeat echo 01/2021 performed for serial monitoring of AS that showed an EF 60-65%, G1DD, trivial MR, mild AR, moderate AS with AVA 0.83cm, mean gradient , Vmax 3.3, DI 0.33.    She then called the office with symptoms of decreased exercise capacity, fatigue, and SOB. At that time, she was referred to the structural heart team and PROGRESS TRIAL assessment for further evaluation. She randomized to the surveillance arm and was monitored  closely.    On follow up evaluation 02/2022 she was having symptoms of dyspnea on exertion and chest pain. Echo at that time with reduced LV function at 45%, mean grad 24.2 mmHg, peak grad 39.1 mmHg, AVA 0.87 cm2, DVI 0.31, SVI 39, BSA: 1.71 m2, and mild AI.    She was then evaluated by the multidisciplinary valve team and felt to have severe, symptomatic aortic stenosis and to be a suitable candidate and is s/p successful TAVR with a 23 mm Edwards Sapien 3 THV via the TF approach on 06/04/22. Post operative echo with normal LVEF, and stable valve function with a mean gradient at 12.31mmHg, and AVA by VTI at 3.22cm2.   She is here today with her friend and reports that she has been well since discharge. On TOC call, she was having some fatigue but she states this has improved. Groin sites with no issues. Asking about returning to exercise. She does have a new LBBB on EKG today but denies and dizziness, SOB, or pre-syncope. ED precautions reviewed. Otherwise she denies chest pain, LE edema, orthopnea, PND, dizziness, bleeding in stool or urine, or syncope.   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 Lynnette Caffey and Dr. Laneta Simmers   Scleroderma    Sjogren's syndrome with keratoconjunctivitis sicca    Vitamin D deficiency     Past Surgical History:  Procedure Laterality Date   DIAGNOSTIC LAPAROSCOPY     age 70   INTRAOPERATIVE TRANSTHORACIC ECHOCARDIOGRAM N/A 06/04/2022   Procedure: INTRAOPERATIVE TRANSTHORACIC ECHOCARDIOGRAM;  Surgeon: Orbie Pyo, MD;  Location: [facility] INVASIVE CV LAB;  Service: Open Heart Surgery;  Laterality: N/A;    RIGHT/LEFT HEART CATH AND CORONARY ANGIOGRAPHY N/A 04/25/2021   Procedure: RIGHT/LEFT HEART CATH AND CORONARY ANGIOGRAPHY;  Surgeon: Orbie Pyo, MD;  Location: [facility] INVASIVE CV LAB;  Service: Cardiovascular;  Laterality: N/A;   TRANSCATHETER AORTIC VALVE REPLACEMENT, TRANSFEMORAL N/A 06/04/2022   Procedure: Transcatheter Aortic Valve Replacement, Transfemoral;  Surgeon: Orbie Pyo, MD;  Location: [facility] INVASIVE CV LAB;  Service: Open Heart Surgery;  Laterality: N/A;    Current Medications: Current Meds  Medication Sig   amLODipine (NORVASC) 5 MG tablet Take 5 mg by mouth daily.   amoxicillin (AMOXIL) 500 MG tablet Take 4 tablets (2,000 mg total) by mouth as directed. 1 HOUR PRIOR TO DENTAL APPOINTMENTS   Ascorbic Acid (VITAMIN C) 1000 MG tablet Take 1,000 mg by mouth daily.   aspirin EC 81 MG tablet Take 1 tablet (81 mg total) by mouth daily. Swallow whole.   atorvastatin (LIPITOR) 20 MG tablet Take 1 tablet (20 mg total) by mouth daily.   Biotin 5000 MCG TABS Take 1 capsule by mouth 3 (three) times a week.   CALCIUM PO Take 1,000 mg by mouth 2 (two) times daily. Gummies   Cholecalciferol (VITAMIN D) 2000 units CAPS Take 2,000 Units by mouth daily.   diclofenac Sodium (VOLTAREN) 1 % GEL Apply 1 Application topically daily as needed (pain).   ELDERBERRY PO Take 1 capsule by mouth daily.   ENBREL SURECLICK 50 MG/ML injection Inject 50 mg into the skin once a week.   famotidine (PEPCID) 20 MG tablet Take 20 mg by mouth 4 (four) times a week.   Flaxseed, Linseed, (FLAX SEED OIL) 1000 MG CAPS Take 1,000 mg by mouth daily.   fluticasone (FLONASE) 50 MCG/ACT nasal spray Place 1 spray into both nostrils 2 (two) times daily.   folic acid (FOLVITE) 1 MG tablet Take 1 mg by mouth daily.   hydroxychloroquine (PLAQUENIL) 200 MG tablet Take 200 mg by mouth 2 (two) times daily.   loratadine (CLARITIN) 10 MG tablet Take 10 mg by mouth daily as needed for allergies.   methotrexate (RHEUMATREX) 2.5 MG  tablet Take 10 mg by mouth 2 (two) times a week. Thursday and Friday   Multiple Vitamin (MULTIVITAMIN) tablet Take 1 tablet by mouth daily.   Omega-3 Fatty Acids (FISH OIL) 1000 MG CAPS Take 1,000 mg by mouth in the morning and at bedtime.   omeprazole (PRILOSEC) 40 MG capsule Take 40 mg by mouth 3 (three) times a week.   pilocarpine (SALAGEN) 5 MG tablet Take 5 mg by mouth 2 (two) times daily.   Polyethyl Glycol-Propyl Glycol (SYSTANE) 0.4-0.3 % SOLN Place 1 drop into both eyes daily as needed (lubricant).   Probiotic Product (PROBIOTIC PO) Take 1 capsule by mouth 2 (two) times a week.   Turmeric 450 MG CAPS 1 capsule Orally Twice  a day     Allergies:   Codeine, Molds & smuts, and Pollen extract   Social History   Socioeconomic History   Marital status: Divorced    Spouse name: Not on file   Number of children: Not on file   Years of education: Not on file   Highest education level: Not on file  Occupational History   Not on file  Tobacco Use   Smoking status: Never   Smokeless tobacco: Never  Vaping Use   Vaping Use: Never used  Substance and Sexual Activity   Alcohol use: Never   Drug use: Never   Sexual activity: Not on file  Other Topics Concern   Not on file  Social History Narrative   Not on file   Social Determinants of Health   Financial Resource Strain: Not on file  Food Insecurity: No Food Insecurity (06/05/2022)   Hunger Vital Sign    Worried About Running Out of Food in the Last Year: Never true    Ran Out of Food in the Last Year: Never true  Transportation Needs: No Transportation Needs (06/05/2022)   PRAPARE - Administrator, Civil Service (Medical): No    Lack of Transportation (Non-Medical): No  Physical Activity: Not on file  Stress: Not on file  Social Connections: Not on file     Family History: The patient's family history is negative for Breast cancer.  ROS:   Please see the history of present illness.    All other systems  reviewed and are negative.  EKGs/Labs/Other Studies Reviewed:    The following studies were reviewed today:  TAVR OPERATIVE NOTE     Date of Procedure:                06/04/2022   Preoperative Diagnosis:      Severe Aortic Stenosis    Postoperative Diagnosis:    Same    Procedure:        Transcatheter Aortic Valve Replacement - Percutaneous Right Transfemoral Approach             Edwards Sapien 3 Ultra Resilia THV (size 23 mm, model # 9755RSL, serial # [id number])              Co-Surgeons:                        Alleen Borne, MD and Alverda Skeans, MD     Anesthesiologist:                  Glade Stanford, MD   Echocardiographer:              Burna Forts, MD   Pre-operative Echo Findings: Severe aortic stenosis Normal left ventricular systolic function   Post-operative Echo Findings: No paravalvular leak Normal left ventricular systolic function _____________   Echo 06/05/22:    1. Left ventricular ejection fraction, by estimation, is 60 to 65%. Left  ventricular ejection fraction by 3D volume is 58 %. The left ventricle has  normal function. The left ventricle has no regional wall motion  abnormalities. Left ventricular diastolic   parameters are consistent with Grade I diastolic dysfunction (impaired  relaxation).   2. Right ventricular systolic function is normal. The right ventricular  size is normal.   3. The mitral valve is normal in structure. Trivial mitral valve  regurgitation. No evidence of mitral stenosis.   4. The aortic valve has been repaired/replaced. Aortic valve  regurgitation is not visualized. No aortic stenosis is present. There is a  23 mm Sapien prosthetic (TAVR) valve present in the aortic position.  Procedure Date: 06/04/2022. Aortic valve mean  gradient measures 12.2 mmHg. Aortic valve Vmax measures 2.37 m/s. Aortic  valve acceleration time measures 56 msec.   5. The inferior vena cava is normal in size with greater than 50%  respiratory  variability, suggesting right atrial pressure of 3 mmHg.    EKG:  EKG with NSR with new LBBB with HR 74bpm    Recent Labs: 05/31/2022: ALT 26; NT-Pro BNP 310 06/05/2022: BUN 12; Creatinine, Ser 0.63; Hemoglobin 10.9; Magnesium 1.8; Platelets 153; Potassium 3.9; Sodium 138   Recent Lipid Panel    Component Value Date/Time   CHOL 161 02/09/2020 0739   TRIG 68 02/09/2020 0739   HDL 90 02/09/2020 0739   CHOLHDL 1.8 02/09/2020 0739   LDLCALC 58 02/09/2020 0739   Physical Exam:    VS:  BP 122/74   Pulse 74   Ht 5\' 4"  (1.626 m)   Wt 142 lb 12.8 oz (64.8 kg)   BMI 24.51 kg/m     Wt Readings from Last 3 Encounters:  06/12/22 142 lb 12.8 oz (64.8 kg)  06/04/22 145 lb (65.8 kg)  05/31/22 145 lb (65.8 kg)    General: Well developed, well nourished, NAD Lungs:Clear to ausculation bilaterally. No wheezes, rales, or rhonchi. Breathing is unlabored. Cardiovascular: RRR with S1 S2. No murmurs Extremities: No edema.  Neuro: Alert and oriented. No focal deficits. No facial asymmetry. MAE spontaneously. Psych: Responds to questions appropriately with normal affect.    ASSESSMENT/PLAN:    Severe AS: Patient doing well with NYHA class I symptoms s/p successful TAVR with a 23 mm Edwards Sapien 3 THV via the TF approach on 06/04/22. Post operative echo with normal LV and stable valve function with a mean gradient at 12.63mmHg, peak 22.45mmHg, and AVA by VTI at 3.22cm2. Groin sites are stable. ECG with NSR and new LBBB but no concerning symptoms. Continue ASA monotherapy. Will require lifelong dental SBE; Amoxicillin sent to preferred pharmacy.   Sjogren's syndrome/scleroderma/raynaud's syndrome: Continue current regimen with Enbrel, methotrexate, and plaquenil    Elevated RAISE score: Noted to have a pre-TAVR elevated RAISE score. Given this, cardiac amyloid screening labs drawn however returned with no concerning valves. No further workup needed.  { Medication Adjustments/Labs and Tests  Ordered: Current medicines are reviewed at length with the patient today.  Concerns regarding medicines are outlined above.  Orders Placed This Encounter  Procedures   EKG 12-Lead   Meds ordered this encounter  Medications   amoxicillin (AMOXIL) 500 MG tablet    Sig: Take 4 tablets (2,000 mg total) by mouth as directed. 1 HOUR PRIOR TO DENTAL APPOINTMENTS    Dispense:  12 tablet    Refill:  6    Patient Instructions  Medication Instructions:  Your physician has recommended you make the following change in your medication:  START AMOXICILLIN 500 MG - TAKE 2000 MG (4 TABLETS) 1 HOUR PRIOR TO DENTAL PROCEDURES   *If you need a refill on your cardiac medications before your next appointment, please call your pharmacy*   Lab Work: NONE If you have labs (blood work) drawn today and your tests are completely normal, you will receive your results only by: MyChart Message (if you have MyChart) OR A paper copy in the mail If you have any lab test that is abnormal or we need to change your  treatment, we will call you to review the results.   Testing/Procedures: NONE   Follow-Up: At [facility], you and your health needs are our priority.  As part of our continuing mission to provide you with exceptional heart care, we have created designated Provider Care Teams.  These Care Teams include your primary Cardiologist (physician) and Advanced Practice Providers (APPs -  Physician Assistants and Nurse Practitioners) who all work together to provide you with the care you need, when you need it.  We recommend signing up for the patient portal called "MyChart".  Sign up information is provided on this After Visit Summary.  MyChart is used to connect with patients for Virtual Visits (Telemedicine).  Patients are able to view lab/test results, encounter notes, upcoming appointments, etc.  Non-urgent messages can be sent to your provider as well.   To learn more about what you can do with  MyChart, go to [email or web address].    Your next appointment:   KEEP SCHEDULED FOLLOW-UP    Signed, Georgie Chard, NP  06/13/2022 12:36 PM    [facility]

## 2022-06-12 ENCOUNTER — Telehealth (HOSPITAL_COMMUNITY): Payer: Self-pay

## 2022-06-12 ENCOUNTER — Ambulatory Visit: Payer: Medicare Other | Attending: Cardiology | Admitting: Cardiology

## 2022-06-12 VITALS — BP 122/74 | HR 74 | Ht 64.0 in | Wt 142.8 lb

## 2022-06-12 DIAGNOSIS — I350 Nonrheumatic aortic (valve) stenosis: Secondary | ICD-10-CM

## 2022-06-12 DIAGNOSIS — I10 Essential (primary) hypertension: Secondary | ICD-10-CM | POA: Diagnosis not present

## 2022-06-12 DIAGNOSIS — Z006 Encounter for examination for normal comparison and control in clinical research program: Secondary | ICD-10-CM | POA: Diagnosis not present

## 2022-06-12 DIAGNOSIS — Z952 Presence of prosthetic heart valve: Secondary | ICD-10-CM | POA: Diagnosis not present

## 2022-06-12 DIAGNOSIS — M3500 Sicca syndrome, unspecified: Secondary | ICD-10-CM

## 2022-06-12 DIAGNOSIS — M069 Rheumatoid arthritis, unspecified: Secondary | ICD-10-CM

## 2022-06-12 DIAGNOSIS — I7300 Raynaud's syndrome without gangrene: Secondary | ICD-10-CM

## 2022-06-12 DIAGNOSIS — M368 Systemic disorders of connective tissue in other diseases classified elsewhere: Secondary | ICD-10-CM

## 2022-06-12 LAB — ECHOCARDIOGRAM COMPLETE
AR max vel: 3.1 cm2
AV Area VTI: 3.22 cm2
AV Area mean vel: 3.19 cm2
AV Mean grad: 12.2 mmHg
AV Peak grad: 22.5 mmHg
Ao pk vel: 2.37 m/s
Area-P 1/2: 4.15 cm2
Height: 64 in
S' Lateral: 2.8 cm
Weight: 2320 oz

## 2022-06-12 MED ORDER — AMOXICILLIN 500 MG PO TABS: 2000.0000 mg | ORAL_TABLET | ORAL | 6 refills | Status: DC

## 2022-06-12 NOTE — Telephone Encounter (Signed)
Attempted to call patient in regards to Cardiac Rehab - LM on VM 

## 2022-06-12 NOTE — Patient Instructions (Addendum)
Medication Instructions:  Your physician has recommended you make the following change in your medication:  START AMOXICILLIN 500 MG - TAKE 2000 MG (4 TABLETS) 1 HOUR PRIOR TO DENTAL PROCEDURES   *If you need a refill on your cardiac medications before your next appointment, please call your pharmacy*   Lab Work: NONE If you have labs (blood work) drawn today and your tests are completely normal, you will receive your results only by: MyChart Message (if you have MyChart) OR A paper copy in the mail If you have any lab test that is abnormal or we need to change your treatment, we will call you to review the results.   Testing/Procedures: NONE   Follow-Up: At [facility], you and your health needs are our priority.  As part of our continuing mission to provide you with exceptional heart care, we have created designated Provider Care Teams.  These Care Teams include your primary Cardiologist (physician) and Advanced Practice Providers (APPs -  Physician Assistants and Nurse Practitioners) who all work together to provide you with the care you need, when you need it.  We recommend signing up for the patient portal called "MyChart".  Sign up information is provided on this After Visit Summary.  MyChart is used to connect with patients for Virtual Visits (Telemedicine).  Patients are able to view lab/test results, encounter notes, upcoming appointments, etc.  Non-urgent messages can be sent to your provider as well.   To learn more about what you can do with MyChart, go to [email or web address].    Your next appointment:   KEEP SCHEDULED FOLLOW-UP

## 2022-06-12 NOTE — Telephone Encounter (Signed)
Pt insurance is active and benefits verified through [facility] Medicare. Co-pay $0.00, DED $200.00/$200.00 met, out of pocket $2,200.00/$200.00 met, co-insurance 0%. No pre-authorization required. Passport, 06/12/22 @ 3:10PM, REF#[id number]   How many CR sessions are covered? (36 sessions/visits for TCR, 72 sessions/visits for ICR)72 visits Is this a lifetime maximum or an annual maximum? Annual Has the member used any of these services to date? No Is there a time limit (weeks/months) on start of program and/or program completion? No     Will contact patient to see if she is interested in the Cardiac Rehab Program. If interested, patient will need to complete follow up appt. Once completed, patient will be contacted for scheduling upon review by the RN Navigator.

## 2022-06-14 ENCOUNTER — Telehealth: Payer: Self-pay

## 2022-06-14 NOTE — Telephone Encounter (Signed)
Left voicemail for patient to return call to office. 

## 2022-06-14 NOTE — Telephone Encounter (Signed)
Called patient to give results of echo. Patient aware, had appointment with Georgie Chard, NP on 06/12/22 and went over results then.

## 2022-06-14 NOTE — Telephone Encounter (Signed)
Patient returned RN's call. 

## 2022-06-14 NOTE — Telephone Encounter (Signed)
-----   Message from Filbert Schilder, NP sent at 06/14/2022  8:04 AM EDT ----- Please let the patient know that her echo looks great with stable valve function and gradients. No changes need at this time.

## 2022-06-21 ENCOUNTER — Telehealth (HOSPITAL_COMMUNITY): Payer: Self-pay

## 2022-06-21 NOTE — Telephone Encounter (Signed)
Called patient to see if she was interested in participating in the Cardiac Rehab Program. Patient stated yes. Patient will come in for orientation on 06/24/22 @ 8AM and will attend the 12:30PM exercise class.   Pensions consultant.

## 2022-06-24 ENCOUNTER — Encounter (HOSPITAL_COMMUNITY)
Admission: RE | Admit: 2022-06-24 | Discharge: 2022-06-24 | Disposition: A | Discharge status: Home / Self Care | Payer: Medicare Other | Source: Ambulatory Visit | Attending: Cardiology | Admitting: Cardiology

## 2022-06-24 VITALS — BP 124/70 | HR 78 | Ht 64.0 in | Wt 145.5 lb

## 2022-06-24 DIAGNOSIS — Z952 Presence of prosthetic heart valve: Secondary | ICD-10-CM

## 2022-06-24 NOTE — Progress Notes (Signed)
Cardiac Individual Treatment Plan  Patient Details  Name: Regina Tate MRN: [id number] Date of Birth: 03-20-1945 Referring Provider:   Flowsheet Row INTENSIVE CARDIAC REHAB ORIENT from 06/24/2022 in [facility]  Referring Provider Laurance Flatten, MD       Initial Encounter Date:  Flowsheet Row INTENSIVE CARDIAC REHAB ORIENT from 06/24/2022 in [facility]  Date 06/24/22       Visit Diagnosis: S/P TAVR (transcatheter aortic valve replacement)  Patient's Home Medications on Admission:  Current Outpatient Medications:    amLODipine (NORVASC) 5 MG tablet, Take 5 mg by mouth daily., Disp: , Rfl:    amoxicillin (AMOXIL) 500 MG tablet, Take 4 tablets (2,000 mg total) by mouth as directed. 1 HOUR PRIOR TO DENTAL APPOINTMENTS, Disp: 12 tablet, Rfl: 6   Ascorbic Acid (VITAMIN C) 1000 MG tablet, Take 1,000 mg by mouth daily., Disp: , Rfl:    aspirin EC 81 MG tablet, Take 1 tablet (81 mg total) by mouth daily. Swallow whole., Disp: 90 tablet, Rfl: 3   atorvastatin (LIPITOR) 20 MG tablet, Take 1 tablet (20 mg total) by mouth daily., Disp: 90 tablet, Rfl: 2   Biotin 5000 MCG TABS, Take 1 capsule by mouth 3 (three) times a week., Disp: , Rfl:    CALCIUM PO, Take 1,000 mg by mouth 2 (two) times daily. Gummies, Disp: , Rfl:    Cholecalciferol (VITAMIN D) 2000 units CAPS, Take 2,000 Units by mouth daily., Disp: , Rfl:    diclofenac Sodium (VOLTAREN) 1 % GEL, Apply 1 Application topically daily as needed (pain)., Disp: , Rfl:    ELDERBERRY PO, Take 1 capsule by mouth daily., Disp: , Rfl:    ENBREL SURECLICK 50 MG/ML injection, Inject 50 mg into the skin once a week., Disp: , Rfl:    famotidine (PEPCID) 20 MG tablet, Take 20 mg by mouth 3 (three) times a week., Disp: , Rfl:    Flaxseed, Linseed, (FLAX SEED OIL) 1000 MG CAPS, Take 1,000 mg by mouth daily., Disp: , Rfl:     fluticasone (FLONASE) 50 MCG/ACT nasal spray, Place 1 spray into both nostrils 2 (two) times daily., Disp: , Rfl:    folic acid (FOLVITE) 1 MG tablet, Take 1 mg by mouth daily., Disp: , Rfl:    hydroxychloroquine (PLAQUENIL) 200 MG tablet, Take 200 mg by mouth 2 (two) times daily., Disp: , Rfl:    loratadine (CLARITIN) 10 MG tablet, Take 10 mg by mouth daily as needed for allergies., Disp: , Rfl:    methotrexate (RHEUMATREX) 2.5 MG tablet, Take 10 mg by mouth 2 (two) times a week. Thursday and Friday, Disp: , Rfl:    Multiple Vitamin (MULTIVITAMIN) tablet, Take 1 tablet by mouth daily., Disp: , Rfl:    Omega-3 Fatty Acids (FISH OIL) 1000 MG CAPS, Take 1,000 mg by mouth in the morning and at bedtime., Disp: , Rfl:    omeprazole (PRILOSEC) 40 MG capsule, Take 40 mg by mouth 4 (four) times a week., Disp: , Rfl:    pilocarpine (SALAGEN) 5 MG tablet, Take 5 mg by mouth 2 (two) times daily., Disp: , Rfl:    Polyethyl Glycol-Propyl Glycol (SYSTANE) 0.4-0.3 % SOLN, Place 1 drop into both eyes daily as needed (lubricant)., Disp: , Rfl:    Probiotic Product (PROBIOTIC PO), Take 1 capsule by mouth 2 (two) times a week., Disp: , Rfl:    Turmeric 450  MG CAPS, Take 450 mg by mouth daily., Disp: , Rfl:   Past Medical History: Past Medical History:  Diagnosis Date   Allergic rhinitis    CREST syndrome (HCC)    Deep vein thrombosis (DVT) (HCC)    Dyspnea    GERD (gastroesophageal reflux disease)    Heart murmur    aortic stenosis   Hypercholesteremia    Hyperlipidemia    Osteopenia    RA (rheumatoid arthritis) (HCC)    Raynaud's syndrome without gangrene    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 Lynnette Caffey and Dr. Laneta Simmers   Scleroderma [facility])    Sjogren's syndrome with keratoconjunctivitis sicca (HCC)    Vitamin D deficiency     Tobacco Use: Social History   Tobacco Use  Smoking Status Never  Smokeless Tobacco Never    Labs: Review Flowsheet        Latest Ref Rng & Units 02/09/2020 04/25/2021 06/04/2022  Labs for ITP Cardiac and Pulmonary Rehab  Cholestrol 100 - 199 mg/dL 161  - -  LDL (calc) 0 - 99 mg/dL 58  - -  HDL-C >09 mg/dL 90  - -  Trlycerides 0 - 149 mg/dL 68  - -  PH, Arterial 6.04 - 7.45 - 7.360  7.382  -  PCO2 arterial 32 - 48 mmHg - 47.1  43.5  -  Bicarbonate 20.0 - 28.0 mmol/L - 26.4  26.6  25.8  -  TCO2 22 - 32 mmol/L - 28  28  27  28    O2 Saturation % - 80  82  98  -    Capillary Blood Glucose: No results found for: "GLUCAP"   Exercise Target Goals: Exercise Program Goal: Individual exercise prescription set using results from initial 6 min walk test and THRR while considering  patient's activity barriers and safety.   Exercise Prescription Goal: Initial exercise prescription builds to 30-45 minutes a day of aerobic activity, 2-3 days per week.  Home exercise guidelines will be given to patient during program as part of exercise prescription that the participant will acknowledge.  Activity Barriers & Risk Stratification:  Activity Barriers & Cardiac Risk Stratification - 06/24/22 1257       Activity Barriers & Cardiac Risk Stratification   Activity Barriers Joint Problems;History of Falls    Cardiac Risk Stratification High   <5 METs on            6 Minute Walk:  6 Minute Walk     Row Name 06/24/22 1256         6 Minute Walk   Phase Initial     Distance 1440 feet     Walk Time 6 minutes     # of Rest Breaks 0     MPH 2.72     METS 3.18     RPE 9     Perceived Dyspnea  0     VO2 Peak 11.14     Symptoms No     Resting HR 80 bpm     Resting BP 124/70     Resting Oxygen Saturation  99 %     Exercise Oxygen Saturation  during 6 min walk 98 %     Max Ex. HR 123 bpm     Max Ex. BP 132/78     2 Minute Post BP 112/80              Oxygen Initial Assessment:   Oxygen  Re-Evaluation:   Oxygen Discharge (Final Oxygen Re-Evaluation):   Initial Exercise Prescription:   Initial Exercise Prescription - 06/24/22 1200       Date of Initial Exercise RX and Referring Provider   Date 06/24/22    Referring Provider Laurance Flatten, MD    Expected Discharge Date 09/06/22      Recumbant Bike   Level 1.5    RPM 50    Watts 30    Minutes 15    METs 2.5      Track   Laps 18    Minutes 15    METs 2.5      Prescription Details   Frequency (times per week) 3    Duration Progress to 30 minutes of continuous aerobic without signs/symptoms of physical distress      Intensity   THRR 40-80% of Max Heartrate 58-116    Ratings of Perceived Exertion 11-13    Perceived Dyspnea 0-4      Progression   Progression Continue progressive overload as per policy without signs/symptoms or physical distress.      Resistance Training   Training Prescription Yes    Weight 1    Reps 10-15             Perform Capillary Blood Glucose checks as needed.  Exercise Prescription Changes:   Exercise Comments:   Exercise Goals and Review:   Exercise Goals     Row Name 06/24/22 0831             Exercise Goals   Increase Physical Activity Yes       Intervention Provide advice, education, support and counseling about physical activity/exercise needs.;Develop an individualized exercise prescription for aerobic and resistive training based on initial evaluation findings, risk stratification, comorbidities and participant's personal goals.       Expected Outcomes Short Term: Attend rehab on a regular basis to increase amount of physical activity.;Long Term: Exercising regularly at least 3-5 days a week.;Long Term: Add in home exercise to make exercise part of routine and to increase amount of physical activity.       Increase Strength and Stamina Yes       Intervention Provide advice, education, support and counseling about physical activity/exercise needs.;Develop an individualized exercise prescription for aerobic and resistive training based on initial evaluation  findings, risk stratification, comorbidities and participant's personal goals.       Expected Outcomes Short Term: Perform resistance training exercises routinely during rehab and add in resistance training at home;Short Term: Increase workloads from initial exercise prescription for resistance, speed, and METs.;Long Term: Improve cardiorespiratory fitness, muscular endurance and strength as measured by increased METs and functional capacity ( )       Able to understand and use rate of perceived exertion (RPE) scale Yes       Intervention Provide education and explanation on how to use RPE scale       Expected Outcomes Short Term: Able to use RPE daily in rehab to express subjective intensity level;Long Term:  Able to use RPE to guide intensity level when exercising independently       Knowledge and understanding of Target Heart Rate Range (THRR) Yes       Intervention Provide education and explanation of THRR including how the numbers were predicted and where they are located for reference       Expected Outcomes Short Term: Able to state/look up THRR;Short Term: Able to use daily as guideline for intensity in rehab;Long Term: Able  to use THRR to govern intensity when exercising independently       Understanding of Exercise Prescription Yes       Intervention Provide education, explanation, and written materials on patient's individual exercise prescription       Expected Outcomes Short Term: Able to explain program exercise prescription;Long Term: Able to explain home exercise prescription to exercise independently                Exercise Goals Re-Evaluation :   Discharge Exercise Prescription (Final Exercise Prescription Changes):   Nutrition:  Target Goals: Understanding of nutrition guidelines, daily intake of sodium 1500mg , cholesterol 200mg , calories 30% from fat and 7% or less from saturated fats, daily to have 5 or more servings of fruits and vegetables.  Biometrics:  Pre  Biometrics - 06/24/22 0807       Pre Biometrics   Waist Circumference 36.25 inches    Hip Circumference 40.5 inches    Waist to Hip Ratio 0.9 %    Triceps Skinfold 28 mm    % Body Fat 38.6 %    Grip Strength 14 kg    Flexibility 14.75 in    Single Leg Stand 16.25 seconds              Nutrition Therapy Plan and Nutrition Goals:   Nutrition Assessments:  MEDIFICTS Score Key: ?70 Need to make dietary changes  40-70 Heart Healthy Diet ? 40 Therapeutic Level Cholesterol Diet    Picture Your Plate Scores: <16 Unhealthy dietary pattern with much room for improvement. 41-50 Dietary pattern unlikely to meet recommendations for good health and room for improvement. 51-60 More healthful dietary pattern, with some room for improvement.  >60 Healthy dietary pattern, although there may be some specific behaviors that could be improved.    Nutrition Goals Re-Evaluation:   Nutrition Goals Re-Evaluation:   Nutrition Goals Discharge (Final Nutrition Goals Re-Evaluation):   Psychosocial: Target Goals: Acknowledge presence or absence of significant depression and/or stress, maximize coping skills, provide positive support system. Participant is able to verbalize types and ability to use techniques and skills needed for reducing stress and depression.  Initial Review & Psychosocial Screening:  Initial Psych Review & Screening - 06/24/22 1310       Initial Review   Current issues with None Identified      Family Dynamics   Good Support System? Yes   Regina Tate has her roommate for support     Barriers   Psychosocial barriers to participate in program There are no identifiable barriers or psychosocial needs.      Screening Interventions   Interventions Encouraged to exercise;Provide feedback about the scores to participant    Expected Outcomes Short Term goal: Identification and review with participant of any Quality of Life or Depression concerns found by scoring the  questionnaire.;Long Term goal: The participant improves quality of Life and PHQ9 Scores as seen by post scores and/or verbalization of changes             Quality of Life Scores:  Quality of Life - 06/24/22 1310       Quality of Life   Select Quality of Life      Quality of Life Scores   Health/Function Pre 25.8 %    Socioeconomic Pre 27.25 %    Psych/Spiritual Pre 25.79 %    Family Pre 26 %    GLOBAL Pre 26.17 %            Scores of 19 and  below usually indicate a poorer quality of life in these areas.  A difference of  2-3 points is a clinically meaningful difference.  A difference of 2-3 points in the total score of the Quality of Life Index has been associated with significant improvement in overall quality of life, self-image, physical symptoms, and general health in studies assessing change in quality of life.  PHQ-9: Review Flowsheet       06/24/2022  Depression screen PHQ 2/9  Decreased Interest 0  Down, Depressed, Hopeless 0  PHQ - 2 Score 0  Altered sleeping 1  Tired, decreased energy 0  Change in appetite 0  Feeling bad or failure about yourself  0  Trouble concentrating 0  Moving slowly or fidgety/restless 0  Suicidal thoughts 0  PHQ-9 Score 1  Difficult doing work/chores Not difficult at all   Interpretation of Total Score  Total Score Depression Severity:  1-4 = Minimal depression, 5-9 = Mild depression, 10-14 = Moderate depression, 15-19 = Moderately severe depression, 20-27 = Severe depression   Psychosocial Evaluation and Intervention:   Psychosocial Re-Evaluation:   Psychosocial Discharge (Final Psychosocial Re-Evaluation):   Vocational Rehabilitation: Provide vocational rehab assistance to qualifying candidates.   Vocational Rehab Evaluation & Intervention:  Vocational Rehab - 06/24/22 0831       Initial Vocational Rehab Evaluation & Intervention   Assessment shows need for Vocational Rehabilitation No   Regina Tate works part time and  is retired full time            Education: Education Goals: Education classes will be provided on a weekly basis, covering required topics. Participant will state understanding/return demonstration of topics presented.     Core Videos: Exercise    Move It!  Clinical staff conducted group or individual video education with verbal and written material and guidebook.  Patient learns the recommended Pritikin exercise program. Exercise with the goal of living a long, healthy life. Some of the health benefits of exercise include controlled diabetes, healthier blood pressure levels, improved cholesterol levels, improved heart and lung capacity, improved sleep, and better body composition. Everyone should speak with their doctor before starting or changing an exercise routine.  Biomechanical Limitations Clinical staff conducted group or individual video education with verbal and written material and guidebook.  Patient learns how biomechanical limitations can impact exercise and how we can mitigate and possibly overcome limitations to have an impactful and balanced exercise routine.  Body Composition Clinical staff conducted group or individual video education with verbal and written material and guidebook.  Patient learns that body composition (ratio of muscle mass to fat mass) is a key component to assessing overall fitness, rather than body weight alone. Increased fat mass, especially visceral belly fat, can put [place] at increased risk for metabolic syndrome, type 2 diabetes, heart disease, and even death. It is recommended to combine diet and exercise (cardiovascular and resistance training) to improve your body composition. Seek guidance from your physician and exercise physiologist before implementing an exercise routine.  Exercise Action Plan Clinical staff conducted group or individual video education with verbal and written material and guidebook.  Patient learns the recommended strategies  to achieve and enjoy long-term exercise adherence, including variety, self-motivation, self-efficacy, and positive decision making. Benefits of exercise include fitness, good health, weight management, more energy, better sleep, less stress, and overall well-being.  Medical   Heart Disease Risk Reduction Clinical staff conducted group or individual video education with verbal and written material and guidebook.  Patient learns our  heart is our most vital organ as it circulates oxygen, nutrients, white blood cells, and hormones throughout the entire body, and carries waste away. Data supports a plant-based eating plan like the Pritikin Program for its effectiveness in slowing progression of and reversing heart disease. The video provides a number of recommendations to address heart disease.   Metabolic Syndrome and Belly Fat  Clinical staff conducted group or individual video education with verbal and written material and guidebook.  Patient learns what metabolic syndrome is, how it leads to heart disease, and how one can reverse it and keep it from coming back. You have metabolic syndrome if you have 3 of the following 5 criteria: abdominal obesity, high blood pressure, high triglycerides, low HDL cholesterol, and high blood sugar.  Hypertension and Heart Disease Clinical staff conducted group or individual video education with verbal and written material and guidebook.  Patient learns that high blood pressure, or hypertension, is very common in the [place]. Hypertension is largely due to excessive salt intake, but other important risk factors include being overweight, physical inactivity, drinking too much alcohol, smoking, and not eating enough potassium from fruits and vegetables. High blood pressure is a leading risk factor for heart attack, stroke, congestive heart failure, dementia, kidney failure, and premature death. Long-term effects of excessive salt intake include stiffening of the  arteries and thickening of heart muscle and organ damage. Recommendations include ways to reduce hypertension and the risk of heart disease.  Diseases of Our Time - Focusing on Diabetes Clinical staff conducted group or individual video education with verbal and written material and guidebook.  Patient learns why the best way to stop diseases of our time is prevention, through food and other lifestyle changes. Medicine (such as prescription pills and surgeries) is often only a Band-Aid on the problem, not a long-term solution. Most common diseases of our time include obesity, type 2 diabetes, hypertension, heart disease, and cancer. The Pritikin Program is recommended and has been proven to help reduce, reverse, and/or prevent the damaging effects of metabolic syndrome.  Nutrition   Overview of the Pritikin Eating Plan  Clinical staff conducted group or individual video education with verbal and written material and guidebook.  Patient learns about the Pritikin Eating Plan for disease risk reduction. The Pritikin Eating Plan emphasizes a wide variety of unrefined, minimally-processed carbohydrates, like fruits, vegetables, whole grains, and legumes. Go, Caution, and Stop food choices are explained. Plant-based and lean animal proteins are emphasized. Rationale provided for low sodium intake for blood pressure control, low added sugars for blood sugar stabilization, and low added fats and oils for coronary artery disease risk reduction and weight management.  Calorie Density  Clinical staff conducted group or individual video education with verbal and written material and guidebook.  Patient learns about calorie density and how it impacts the Pritikin Eating Plan. Knowing the characteristics of the food you choose will help you decide whether those foods will lead to weight gain or weight loss, and whether you want to consume more or less of them. Weight loss is usually a side effect of the Pritikin  Eating Plan because of its focus on low calorie-dense foods.  Label Reading  Clinical staff conducted group or individual video education with verbal and written material and guidebook.  Patient learns about the Pritikin recommended label reading guidelines and corresponding recommendations regarding calorie density, added sugars, sodium content, and whole grains.  Dining Out - Part 1  Clinical staff conducted group or individual  video education with verbal and written material and guidebook.  Patient learns that restaurant meals can be sabotaging because they can be so high in calories, fat, sodium, and/or sugar. Patient learns recommended strategies on how to positively address this and avoid unhealthy pitfalls.  Facts on Fats  Clinical staff conducted group or individual video education with verbal and written material and guidebook.  Patient learns that lifestyle modifications can be just as effective, if not more so, as many medications for lowering your risk of heart disease. A Pritikin lifestyle can help to reduce your risk of inflammation and atherosclerosis (cholesterol build-up, or plaque, in the artery walls). Lifestyle interventions such as dietary choices and physical activity address the cause of atherosclerosis. A review of the types of fats and their impact on blood cholesterol levels, along with dietary recommendations to reduce fat intake is also included.  Nutrition Action Plan  Clinical staff conducted group or individual video education with verbal and written material and guidebook.  Patient learns how to incorporate Pritikin recommendations into their lifestyle. Recommendations include planning and keeping personal health goals in mind as an important part of their success.  Healthy Mind-Set    Healthy Minds, Bodies, Hearts  Clinical staff conducted group or individual video education with verbal and written material and guidebook.  Patient learns how to identify when they  are stressed. Video will discuss the impact of that stress, as well as the many benefits of stress management. Patient will also be introduced to stress management techniques. The way we think, act, and feel has an impact on our hearts.  How Our Thoughts Can Heal Our Hearts  Clinical staff conducted group or individual video education with verbal and written material and guidebook.  Patient learns that negative thoughts can cause depression and anxiety. This can result in negative lifestyle behavior and serious health problems. Cognitive behavioral therapy is an effective method to help control our thoughts in order to change and improve our emotional outlook.  Additional Videos:  Exercise    Improving Performance  Clinical staff conducted group or individual video education with verbal and written material and guidebook.  Patient learns to use a non-linear approach by alternating intensity levels and lengths of time spent exercising to help burn more calories and Tate more body fat. Cardiovascular exercise helps improve heart health, metabolism, hormonal balance, blood sugar control, and recovery from fatigue. Resistance training improves strength, endurance, balance, coordination, reaction time, metabolism, and muscle mass. Flexibility exercise improves circulation, posture, and balance. Seek guidance from your physician and exercise physiologist before implementing an exercise routine and learn your capabilities and proper form for all exercise.  Introduction to Yoga  Clinical staff conducted group or individual video education with verbal and written material and guidebook.  Patient learns about yoga, a discipline of the coming together of mind, breath, and body. The benefits of yoga include improved flexibility, improved range of motion, better posture and core strength, increased lung function, weight loss, and positive self-image. Yoga's heart health benefits include lowered blood pressure,  healthier heart rate, decreased cholesterol and triglyceride levels, improved immune function, and reduced stress. Seek guidance from your physician and exercise physiologist before implementing an exercise routine and learn your capabilities and proper form for all exercise.  Medical   Aging: Enhancing Your Quality of Life  Clinical staff conducted group or individual video education with verbal and written material and guidebook.  Patient learns key strategies and recommendations to stay in good physical health and enhance  quality of life, such as prevention strategies, having an advocate, securing a Health Care Proxy and Power of Attorney, and keeping a list of medications and system for tracking them. It also discusses how to avoid risk for bone loss.  Biology of Weight Control  Clinical staff conducted group or individual video education with verbal and written material and guidebook.  Patient learns that weight gain occurs because we consume more calories than we burn (eating more, moving less). Even if your body weight is normal, you may have higher ratios of fat compared to muscle mass. Too much body fat puts you at increased risk for cardiovascular disease, heart attack, stroke, type 2 diabetes, and obesity-related cancers. In addition to exercise, following the Pritikin Eating Plan can help reduce your risk.  Decoding Lab Results  Clinical staff conducted group or individual video education with verbal and written material and guidebook.  Patient learns that lab test reflects one measurement whose values change over time and are influenced by many factors, including medication, stress, sleep, exercise, food, hydration, pre-existing medical conditions, and more. It is recommended to use the knowledge from this video to become more involved with your lab results and evaluate your numbers to speak with your doctor.   Diseases of Our Time - Overview  Clinical staff conducted group or  individual video education with verbal and written material and guidebook.  Patient learns that according to the CDC, 50% to 70% of chronic diseases (such as obesity, type 2 diabetes, elevated lipids, hypertension, and heart disease) are avoidable through lifestyle improvements including healthier food choices, listening to satiety cues, and increased physical activity.  Sleep Disorders Clinical staff conducted group or individual video education with verbal and written material and guidebook.  Patient learns how good quality and duration of sleep are important to overall health and well-being. Patient also learns about sleep disorders and how they impact health along with recommendations to address them, including discussing with a physician.  Nutrition  Dining Out - Part 2 Clinical staff conducted group or individual video education with verbal and written material and guidebook.  Patient learns how to plan ahead and communicate in order to maximize their dining experience in a healthy and nutritious manner. Included are recommended food choices based on the type of restaurant the patient is visiting.   Fueling a Banker conducted group or individual video education with verbal and written material and guidebook.  There is a strong connection between our food choices and our health. Diseases like obesity and type 2 diabetes are very prevalent and are in large-part due to lifestyle choices. The Pritikin Eating Plan provides plenty of food and hunger-curbing satisfaction. It is easy to follow, affordable, and helps reduce health risks.  Menu Workshop  Clinical staff conducted group or individual video education with verbal and written material and guidebook.  Patient learns that restaurant meals can sabotage health goals because they are often packed with calories, fat, sodium, and sugar. Recommendations include strategies to plan ahead and to communicate with the manager,  chef, or server to help order a healthier meal.  Planning Your Eating Strategy  Clinical staff conducted group or individual video education with verbal and written material and guidebook.  Patient learns about the Pritikin Eating Plan and its benefit of reducing the risk of disease. The Pritikin Eating Plan does not focus on calories. Instead, it emphasizes high-quality, nutrient-rich foods. By knowing the characteristics of the foods, we choose, we can determine their  calorie density and make informed decisions.  Targeting Your Nutrition Priorities  Clinical staff conducted group or individual video education with verbal and written material and guidebook.  Patient learns that lifestyle habits have a tremendous impact on disease risk and progression. This video provides eating and physical activity recommendations based on your personal health goals, such as reducing LDL cholesterol, losing weight, preventing or controlling type 2 diabetes, and reducing high blood pressure.  Vitamins and Minerals  Clinical staff conducted group or individual video education with verbal and written material and guidebook.  Patient learns different ways to obtain key vitamins and minerals, including through a recommended healthy diet. It is important to discuss all supplements you take with your doctor.   Healthy Mind-Set    Smoking Cessation  Clinical staff conducted group or individual video education with verbal and written material and guidebook.  Patient learns that cigarette smoking and tobacco addiction pose a serious health risk which affects millions of people. Stopping smoking will significantly reduce the risk of heart disease, lung disease, and many forms of cancer. Recommended strategies for quitting are covered, including working with your doctor to develop a successful plan.  Culinary   Becoming a Set designer conducted group or individual video education with verbal and written  material and guidebook.  Patient learns that cooking at home can be healthy, cost-effective, quick, and puts them in control. Keys to cooking healthy recipes will include looking at your recipe, assessing your equipment needs, planning ahead, making it simple, choosing cost-effective seasonal ingredients, and limiting the use of added fats, salts, and sugars.  Cooking - Breakfast and Snacks  Clinical staff conducted group or individual video education with verbal and written material and guidebook.  Patient learns how important breakfast is to satiety and nutrition through the entire day. Recommendations include key foods to eat during breakfast to help stabilize blood sugar levels and to prevent overeating at meals later in the day. Planning ahead is also a key component.  Cooking - Educational psychologist conducted group or individual video education with verbal and written material and guidebook.  Patient learns eating strategies to improve overall health, including an approach to cook more at home. Recommendations include thinking of animal protein as a side on your plate rather than center stage and focusing instead on lower calorie dense options like vegetables, fruits, whole grains, and plant-based proteins, such as beans. Making sauces in large quantities to freeze for later and leaving the skin on your vegetables are also recommended to maximize your experience.  Cooking - Healthy Salads and Dressing Clinical staff conducted group or individual video education with verbal and written material and guidebook.  Patient learns that vegetables, fruits, whole grains, and legumes are the foundations of the Pritikin Eating Plan. Recommendations include how to incorporate each of these in flavorful and healthy salads, and how to create homemade salad dressings. Proper handling of ingredients is also covered. Cooking - Soups and [employer] - Soups and Desserts Clinical staff conducted  group or individual video education with verbal and written material and guidebook.  Patient learns that Pritikin soups and desserts make for easy, nutritious, and delicious snacks and meal components that are low in sodium, fat, sugar, and calorie density, while high in vitamins, minerals, and filling fiber. Recommendations include simple and healthy ideas for soups and desserts.   Overview     The Pritikin Solution Program Overview Clinical staff conducted group or individual video  education with verbal and written material and guidebook.  Patient learns that the results of the Pritikin Program have been documented in more than 100 articles published in peer-reviewed journals, and the benefits include reducing risk factors for (and, in some cases, even reversing) high cholesterol, high blood pressure, type 2 diabetes, obesity, and more! An overview of the three key pillars of the Pritikin Program will be covered: eating well, doing regular exercise, and having a healthy mind-set.  WORKSHOPS  Exercise: Exercise Basics: Building Your Action Plan Clinical staff led group instruction and group discussion with PowerPoint presentation and patient guidebook. To enhance the learning environment the use of posters, models and videos may be added. At the conclusion of this workshop, patients will comprehend the difference between physical activity and exercise, as well as the benefits of incorporating both, into their routine. Patients will understand the FITT (Frequency, Intensity, Time, and Type) principle and how to use it to build an exercise action plan. In addition, safety concerns and other considerations for exercise and cardiac rehab will be addressed by the presenter. The purpose of this lesson is to promote a comprehensive and effective weekly exercise routine in order to improve patients' overall level of fitness.   Managing Heart Disease: Your Path to a Healthier Heart Clinical staff led  group instruction and group discussion with PowerPoint presentation and patient guidebook. To enhance the learning environment the use of posters, models and videos may be added.At the conclusion of this workshop, patients will understand the anatomy and physiology of the heart. Additionally, they will understand how Pritikin's three pillars impact the risk factors, the progression, and the management of heart disease.  The purpose of this lesson is to provide a high-level overview of the heart, heart disease, and how the Pritikin lifestyle positively impacts risk factors.  Exercise Biomechanics Clinical staff led group instruction and group discussion with PowerPoint presentation and patient guidebook. To enhance the learning environment the use of posters, models and videos may be added. Patients will learn how the structural parts of their bodies function and how these functions impact their daily activities, movement, and exercise. Patients will learn how to promote a neutral spine, learn how to manage pain, and identify ways to improve their physical movement in order to promote healthy living. The purpose of this lesson is to expose patients to common physical limitations that impact physical activity. Participants will learn practical ways to adapt and manage aches and pains, and to minimize their effect on regular exercise. Patients will learn how to maintain good posture while sitting, walking, and lifting.  Balance Training and Fall Prevention  Clinical staff led group instruction and group discussion with PowerPoint presentation and patient guidebook. To enhance the learning environment the use of posters, models and videos may be added. At the conclusion of this workshop, patients will understand the importance of their sensorimotor skills (vision, proprioception, and the vestibular system) in maintaining their ability to balance as they age. Patients will apply a variety of balancing  exercises that are appropriate for their current level of function. Patients will understand the common causes for poor balance, possible solutions to these problems, and ways to modify their physical environment in order to minimize their fall risk. The purpose of this lesson is to teach patients about the importance of maintaining balance as they age and ways to minimize their risk of falling.  WORKSHOPS   Nutrition:  Fueling a Ship broker led group instruction and group discussion with  PowerPoint presentation and patient guidebook. To enhance the learning environment the use of posters, models and videos may be added. Patients will review the foundational principles of the Pritikin Eating Plan and understand what constitutes a serving size in each of the food groups. Patients will also learn Pritikin-friendly foods that are better choices when away from home and review make-ahead meal and snack options. Calorie density will be reviewed and applied to three nutrition priorities: weight maintenance, weight loss, and weight gain. The purpose of this lesson is to reinforce (in a group setting) the key concepts around what patients are recommended to eat and how to apply these guidelines when away from home by planning and selecting Pritikin-friendly options. Patients will understand how calorie density may be adjusted for different weight management goals.  Mindful Eating  Clinical staff led group instruction and group discussion with PowerPoint presentation and patient guidebook. To enhance the learning environment the use of posters, models and videos may be added. Patients will briefly review the concepts of the Pritikin Eating Plan and the importance of low-calorie dense foods. The concept of mindful eating will be introduced as well as the importance of paying attention to internal hunger signals. Triggers for non-hunger eating and techniques for dealing with triggers will be explored.  The purpose of this lesson is to provide patients with the opportunity to review the basic principles of the Pritikin Eating Plan, discuss the value of eating mindfully and how to measure internal cues of hunger and fullness using the Hunger Scale. Patients will also discuss reasons for non-hunger eating and learn strategies to use for controlling emotional eating.  Targeting Your Nutrition Priorities Clinical staff led group instruction and group discussion with PowerPoint presentation and patient guidebook. To enhance the learning environment the use of posters, models and videos may be added. Patients will learn how to determine their genetic susceptibility to disease by reviewing their family history. Patients will gain insight into the importance of diet as part of an overall healthy lifestyle in mitigating the impact of genetics and other environmental insults. The purpose of this lesson is to provide patients with the opportunity to assess their personal nutrition priorities by looking at their family history, their own health history and current risk factors. Patients will also be able to discuss ways of prioritizing and modifying the Pritikin Eating Plan for their highest risk areas  Menu  Clinical staff led group instruction and group discussion with PowerPoint presentation and patient guidebook. To enhance the learning environment the use of posters, models and videos may be added. Using menus brought in from [employer], or printed from [employer], patients will apply the Pritikin dining out guidelines that were presented in the [employer] video. Patients will also be able to practice these guidelines in a variety of provided scenarios. The purpose of this lesson is to provide patients with the opportunity to practice hands-on learning of the Pritikin Dining Out guidelines with actual menus and practice scenarios.  Label Reading Clinical staff led group instruction  and group discussion with PowerPoint presentation and patient guidebook. To enhance the learning environment the use of posters, models and videos may be added. Patients will review and discuss the Pritikin label reading guidelines presented in Pritikin's Label Reading Educational series video. Using fool labels brought in from local grocery stores and markets, patients will apply the label reading guidelines and determine if the packaged food meet the Pritikin guidelines. The purpose of this lesson is to provide  patients with the opportunity to review, discuss, and practice hands-on learning of the Pritikin Label Reading guidelines with actual packaged food labels. Cooking School  Pritikin's [employer] are designed to teach patients ways to prepare quick, simple, and affordable recipes at home. The importance of nutrition's role in chronic disease risk reduction is reflected in its emphasis in the overall Pritikin program. By learning how to prepare essential core Pritikin Eating Plan recipes, patients will increase control over what they eat; be able to customize the flavor of foods without the use of added salt, sugar, or fat; and improve the quality of the food they consume. By learning a set of core recipes which are easily assembled, quickly prepared, and affordable, patients are more likely to prepare more healthy foods at home. These workshops focus on convenient breakfasts, simple entres, side dishes, and desserts which can be prepared with minimal effort and are consistent with nutrition recommendations for cardiovascular risk reduction. Cooking [employer] are taught by a Armed forces logistics/support/administrative officer (RD) who has been trained by the [employer]. The chef or RD has a clear understanding of the importance of minimizing - if not completely eliminating - added fat, sugar, and sodium in recipes. Throughout the series of Cooking School Workshop sessions, patients will learn  about healthy ingredients and efficient methods of cooking to build confidence in their capability to prepare    Cooking School weekly topics:  Adding Flavor- Sodium-Free  Fast and Healthy Breakfasts  Powerhouse Plant-Based Proteins  Satisfying Salads and Dressings  Simple Sides and Sauces  International Cuisine-Spotlight on the [employer] Zones  Delicious Desserts  Savory Soups  [employer] - Meals in a Astronomer Appetizers and Snacks  Comforting Weekend Breakfasts  One-Pot Wonders   Fast Evening Meals  Landscape architect Your Pritikin Plate  WORKSHOPS   Healthy Mindset (Psychosocial):  Focused Goals, Sustainable Changes Clinical staff led group instruction and group discussion with PowerPoint presentation and patient guidebook. To enhance the learning environment the use of posters, models and videos may be added. Patients will be able to apply effective goal setting strategies to establish at least one personal goal, and then take consistent, meaningful action toward that goal. They will learn to identify common barriers to achieving personal goals and develop strategies to overcome them. Patients will also gain an understanding of how our mind-set can impact our ability to achieve goals and the importance of cultivating a positive and growth-oriented mind-set. The purpose of this lesson is to provide patients with a deeper understanding of how to set and achieve personal goals, as well as the tools and strategies needed to overcome common obstacles which may arise along the way.  From Head to Heart: The Power of a Healthy Outlook  Clinical staff led group instruction and group discussion with PowerPoint presentation and patient guidebook. To enhance the learning environment the use of posters, models and videos may be added. Patients will be able to recognize and describe the impact of emotions and mood on physical health. They will discover the importance of  self-care and explore self-care practices which may work for them. Patients will also learn how to utilize the 4 C's to cultivate a healthier outlook and better manage stress and challenges. The purpose of this lesson is to demonstrate to patients how a healthy outlook is an essential part of maintaining good health, especially as they continue their cardiac rehab journey.  Healthy Sleep for a Healthy Heart Clinical  staff led group instruction and group discussion with PowerPoint presentation and patient guidebook. To enhance the learning environment the use of posters, models and videos may be added. At the conclusion of this workshop, patients will be able to demonstrate knowledge of the importance of sleep to overall health, well-being, and quality of life. They will understand the symptoms of, and treatments for, common sleep disorders. Patients will also be able to identify daytime and nighttime behaviors which impact sleep, and they will be able to apply these tools to help manage sleep-related challenges. The purpose of this lesson is to provide patients with a general overview of sleep and outline the importance of quality sleep. Patients will learn about a few of the most common sleep disorders. Patients will also be introduced to the concept of "sleep hygiene," and discover ways to self-manage certain sleeping problems through simple daily behavior changes. Finally, the workshop will motivate patients by clarifying the links between quality sleep and their goals of heart-healthy living.   Recognizing and Reducing Stress Clinical staff led group instruction and group discussion with PowerPoint presentation and patient guidebook. To enhance the learning environment the use of posters, models and videos may be added. At the conclusion of this workshop, patients will be able to understand the types of stress reactions, differentiate between acute and chronic stress, and recognize the impact that chronic  stress has on their health. They will also be able to apply different coping mechanisms, such as reframing negative self-talk. Patients will have the opportunity to practice a variety of stress management techniques, such as deep abdominal breathing, progressive muscle relaxation, and/or guided imagery.  The purpose of this lesson is to educate patients on the role of stress in their lives and to provide healthy techniques for coping with it.  Learning Barriers/Preferences:  Learning Barriers/Preferences - 06/24/22 1311       Learning Barriers/Preferences   Learning Barriers Sight   reading glasses   Learning Preferences Computer/Internet;Individual Instruction;Pictoral;Video;Written Material;Verbal Instruction;Skilled Demonstration;Group Instruction             Education Topics:  Knowledge Questionnaire Score:  Knowledge Questionnaire Score - 06/24/22 1311       Knowledge Questionnaire Score   Pre Score 23/24             Core Components/Risk Factors/Patient Goals at Admission:  Personal Goals and Risk Factors at Admission - 06/24/22 0831       Core Components/Risk Factors/Patient Goals on Admission    Weight Management Yes;Weight Maintenance    Intervention Weight Management: Develop a combined nutrition and exercise program designed to reach desired caloric intake, while maintaining appropriate intake of nutrient and fiber, sodium and fats, and appropriate energy expenditure required for the weight goal.;Weight Management: Provide education and appropriate resources to help participant work on and attain dietary goals.    Expected Outcomes Short Term: Continue to assess and modify interventions until short term weight is achieved;Long Term: Adherence to nutrition and physical activity/exercise program aimed toward attainment of established weight goal;Weight Maintenance: Understanding of the daily nutrition guidelines, which includes 25-35% calories from fat, 7% or less cal  from saturated fats, less than 200mg  cholesterol, less than 1.5gm of sodium, & 5 or more servings of fruits and vegetables daily;Understanding recommendations for meals to include 15-35% energy as protein, 25-35% energy from fat, 35-60% energy from carbohydrates, less than 200mg  of dietary cholesterol, 20-35 gm of total fiber daily;Understanding of distribution of calorie intake throughout the day with the consumption of 4-5  meals/snacks    Hypertension Yes    Intervention Provide education on lifestyle modifcations including regular physical activity/exercise, weight management, moderate sodium restriction and increased consumption of fresh fruit, vegetables, and low fat dairy, alcohol moderation, and smoking cessation.;Monitor prescription use compliance.    Expected Outcomes Short Term: Continued assessment and intervention until BP is < 140/65mm HG in hypertensive participants. < 130/24mm HG in hypertensive participants with diabetes, heart failure or chronic kidney disease.;Long Term: Maintenance of blood pressure at goal levels.    Lipids Yes    Intervention Provide education and support for participant on nutrition & aerobic/resistive exercise along with prescribed medications to achieve LDL 70mg , HDL >40mg .    Expected Outcomes Short Term: Participant states understanding of desired cholesterol values and is compliant with medications prescribed. Participant is following exercise prescription and nutrition guidelines.;Long Term: Cholesterol controlled with medications as prescribed, with individualized exercise RX and with personalized nutrition plan. Value goals: LDL < 70mg , HDL > 40 mg.             Core Components/Risk Factors/Patient Goals Review:    Core Components/Risk Factors/Patient Goals at Discharge (Final Review):    ITP Comments:  ITP Comments     Row Name 06/24/22 0808           ITP Comments Dr. Armanda Magic medical director. Introduction to pritikin education  program/ intensive cardiac rehab. Initial orientation packet reviewed with patient.                Comments: Participant attended orientation for the cardiac rehabilitation program on  06/24/2022  to perform initial intake and exercise walk test. Patient introduced to the [facility] education and orientation packet was reviewed. Completed 6-minute walk test, measurements, initial ITP, and exercise prescription. Vital signs stable. Telemetry-normal sinus rhythm LBBB, asymptomatic.   Service time was from 0759 to 0940.  [name], MS 06/24/2022 1:18 PM

## 2022-06-24 NOTE — Progress Notes (Signed)
Cardiac Rehab Medication Review   Does the patient  feel that his/her medications are working for him/her?  YES   Has the patient been experiencing any side effects to the medications prescribed?  NO  Does the patient measure his/her own blood pressure or blood glucose at home?  YES    Does the patient have any problems obtaining medications due to transportation or finances?   NO  Understanding of regimen: excellent Understanding of indications: excellent Potential of compliance: excellent    Comments: Regina Tate has a great understanding of her medications and regime. She has a BP cuff at home.     [name], MS 06/24/2022 1:15 PM

## 2022-06-30 NOTE — Progress Notes (Unsigned)
Cardiology Office Note:    Date:  06/30/2022   ID:  Regina Tate, DOB 12-Oct-1945, MRN [id number]  PCP:  Ollen Bowl, MD  [facility] Cardiologist:  Meriam Sprague, MD  [facility] Electrophysiologist:  None   Referring MD: Ollen Bowl, MD   History of Present Illness:    Regina Tate is a 77 y.o. female with a histiry of RA, scleroderma, and moderate aortic stenosis who was initially referred for moderate AS who now returns to clinic for follow-up.  Patient initially saw Dr. [name] in 2018 for possible enrollment into the pulmonary HTN screening program. At that time, she denied any history of cardiac problems. She was active with walking and yoga with no exertional symptoms. Has a very strong family history of CAD. TTE and PFTs at that time without evidence of PAH or pulmonary fibrosis but noted mild aortic stenosis with peak gradient . She was recommended for yearly follow-up.   She underwent TTE on 11/30/19 which showed interval progression in AS with severe valvular thickening and calcification, moderate AS with mean gradient and AVA 1.4cm2. LVEF remained normal at 60-65%. Ca score 119.  When she saw me in clinic on 11/2019, she was doing well without exertional or HF symptoms. She saw Edd Fabian on 01/22/21 where she continued to feel well. TTE was ordered for serial monitoring of AS.   TTE on 02/02/21 showed EF 60-65%, G1DD, trivial MR, mild AR, moderate AS with AVA 0.83cm, mean gradient , Vmax 3.3, DI 0.33. She called clinic on 03/14/21 with symptoms of decreased exercise capacity, fatigue and SOB. She now presents to clinic for further evaluation.  Was seen in clinic on 02/2021 where she was feeling more fatigue and dyspnea with exercise. We referred her to Dr. Lynnette Caffey for enrollment into the Progress Trial for moderate AS. Cath showed mild CAD, normal filling pressures. She is currently enrolled in the control  group.  Was seen in clinic on 05/2021 where she reported an episode where she had transient loss of vision like a "curtain came over her eye." Carotid 06/2021 without significant disease. Cardiac monitor 06/2021 with occasional PVCs (4.3%) and episodes of nonsustained SVT with longest 11.9s but no Afib. She was referred to Neurology where MRI head showed small vessel disease. MRA head was normal.  Was seen in 02/2022 with continued DOE and chest pain. Echo at that time with reduced LV function at 45%, mean grad 24.2 mmHg, peak grad 39.1 mmHg, AVA 0.87 cm2, DVI 0.31, SVI 39, BSA: 1.71 m2, and mild AI. She was then evaluated by the multidisciplinary valve team and felt to have severe, symptomatic aortic stenosis and to be a suitable candidate and is s/p successful TAVR with a 23 mm Edwards Sapien 3 THV via the TF approach on 06/04/22. Post operative echo with normal LVEF, and stable valve function with a mean gradient at 12.32mmHg, and AVA by VTI at 3.22cm2.   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 Lynnette Caffey and Dr.  Bartle   Scleroderma (HCC)    Sjogren's syndrome with keratoconjunctivitis sicca (HCC)    Vitamin D deficiency     Past Surgical History:  Procedure Laterality Date   DIAGNOSTIC LAPAROSCOPY     age 70   INTRAOPERATIVE TRANSTHORACIC ECHOCARDIOGRAM N/A 06/04/2022   Procedure: INTRAOPERATIVE TRANSTHORACIC ECHOCARDIOGRAM;  Surgeon: Orbie Pyo, MD;  Location: [facility];  Service: Open Heart Surgery;  Laterality: N/A;   RIGHT/LEFT HEART CATH AND CORONARY ANGIOGRAPHY N/A 04/25/2021    Procedure: RIGHT/LEFT HEART CATH AND CORONARY ANGIOGRAPHY;  Surgeon: Orbie Pyo, MD;  Location: [facility];  Service: Cardiovascular;  Laterality: N/A;   TRANSCATHETER AORTIC VALVE REPLACEMENT, TRANSFEMORAL N/A 06/04/2022   Procedure: Transcatheter Aortic Valve Replacement, Transfemoral;  Surgeon: Orbie Pyo, MD;  Location: [facility];  Service: Open Heart Surgery;  Laterality: N/A;    Current Medications: No outpatient medications have been marked as taking for the 07/08/22 encounter (Appointment) with Meriam Sprague, MD.     Allergies:   Codeine, Molds & smuts, and Pollen extract   Social History   Socioeconomic History   Marital status: Divorced    Spouse name: Not on file   Number of children: Not on file   Years of education: Not on file   Highest education level: Not on file  Occupational History   Not on file  Tobacco Use   Smoking status: Never   Smokeless tobacco: Never  Vaping Use   Vaping Use: Never used  Substance and Sexual Activity   Alcohol use: Never   Drug use: Never   Sexual activity: Not on file  Other Topics Concern   Not on file  Social History Narrative   Not on file   Social Determinants of Health   Financial Resource Strain: Not on file  Food Insecurity: No Food Insecurity (06/05/2022)   Hunger Vital Sign    Worried About Running Out of Food in the Last Year: Never true    Ran Out of Food in the Last Year: Never true  Transportation Needs: No Transportation Needs (06/05/2022)   PRAPARE - Administrator, Civil Service (Medical): No    Lack of Transportation (Non-Medical): No  Physical Activity: Not on file  Stress: Not on file  Social Connections: Not on file     Family History: Sister had CVA 22. Father died MI 80, Aunt 69 CVA, Grandfather died 23 with MI, Mother died at 73 with CHF.   ROS:   Please see the history of present illness.    Review of Systems  Constitutional:  Negative for chills,  fever and malaise/fatigue.  HENT:  Negative for hearing loss and sore throat.   Eyes:  Positive for blurred vision (bilateral eyes).  Respiratory:  Positive for shortness of breath (exertional). Negative for cough.   Cardiovascular:  Positive for leg swelling (Nocturnal tightness in ankles). Negative for chest pain, palpitations, orthopnea, claudication and PND.  Gastrointestinal:  Negative for nausea and vomiting.  Genitourinary:  Negative for dysuria.  Musculoskeletal:  Positive for myalgias ("body stiffening"). Negative for joint pain.  Skin:  Negative for rash.  Neurological:  Positive for dizziness. Negative for loss of consciousness.  Endo/Heme/Allergies:  Does not bruise/bleed easily.  Psychiatric/Behavioral:  Negative for depression.     EKGs/Labs/Other Studies Reviewed:    The following studies were reviewed today: TAVR OPERATIVE NOTE     Date of Procedure:  06/04/2022   Preoperative Diagnosis:      Severe Aortic Stenosis    Postoperative Diagnosis:    Same    Procedure:        Transcatheter Aortic Valve Replacement - Percutaneous Right Transfemoral Approach             Edwards Sapien 3 Ultra Resilia THV (size 23 mm, model # 9755RSL, serial # [id number])              Co-Surgeons:                        Alleen Borne, MD and Alverda Skeans, MD     Anesthesiologist:                  Glade Stanford, MD   Echocardiographer:              Burna Forts, MD   Pre-operative Echo Findings: Severe aortic stenosis Normal left ventricular systolic function   Post-operative Echo Findings: No paravalvular leak Normal left ventricular systolic function _____________   Echo 06/05/22:    1. Left ventricular ejection fraction, by estimation, is 60 to 65%. Left  ventricular ejection fraction by 3D volume is 58 %. The left ventricle has  normal function. The left ventricle has no regional wall motion  abnormalities. Left ventricular diastolic   parameters are consistent  with Grade I diastolic dysfunction (impaired  relaxation).   2. Right ventricular systolic function is normal. The right ventricular  size is normal.   3. The mitral valve is normal in structure. Trivial mitral valve  regurgitation. No evidence of mitral stenosis.   4. The aortic valve has been repaired/replaced. Aortic valve  regurgitation is not visualized. No aortic stenosis is present. There is a  23 mm Sapien prosthetic (TAVR) valve present in the aortic position.  Procedure Date: 06/04/2022. Aortic valve mean  gradient measures 12.2 mmHg. Aortic valve Vmax measures 2.37 m/s. Aortic  valve acceleration time measures 56 msec.   5. The inferior vena cava is normal in size with greater than 50%  respiratory variability, suggesting right atrial pressure of 3 mmHg.     Cardiac Monitor 05/02023: Patch wear time was 11 days and 14 hours Predominant rhythm was NSR with average HR 83bpm One nonsustained episode of VT lasting 11 beats 33 runs of SVT with longest lasting 11.9s Rare SVE (<1%), occasional VE (4.3%) Patient triggered events correlate with PVCs No Afib or significant pauses  RHC/LHC 03/23: LV end diastolic pressure is normal.   The left ventricular ejection fraction is 55-65% by visual estimate.   1.  Mild obstructive coronary artery disease 2.  Cardiac output of 8.6 L/min and cardiac index of 5.0 L/min/m. 3.  RA pressure of 8/4 with a mean of 3 mmHg, wedge pressure of 8/6 with a mean of 4 mmHg, pulmonary artery pressure 24/7, and papi of greater than 5.   EKG:  ***  Recent Labs: 05/31/2022: ALT 26; NT-Pro BNP 310 06/05/2022: BUN 12; Creatinine, Ser 0.63; Hemoglobin 10.9; Magnesium 1.8; Platelets 153; Potassium 3.9; Sodium 138   Recent Lipid Panel    Component Value Date/Time   CHOL 161 02/09/2020 0739   TRIG 68 02/09/2020 0739   HDL 90 02/09/2020 0739   CHOLHDL 1.8 02/09/2020 0739   LDLCALC 58 02/09/2020 0739     Physical Exam:    VS:  There were no vitals  taken for this visit.    Wt Readings from  Last 3 Encounters:  06/24/22 145 lb 8 oz (66 kg)  06/12/22 142 lb 12.8 oz (64.8 kg)  06/04/22 145 lb (65.8 kg)     GEN:  Well nourished, well developed in no acute distress HEENT: Normal NECK: No JVD; No carotid bruits CARDIAC: RRR, 3/6 crescendo-descrescendo systolic murmur that peaks mid-systole with preserved A2 RESPIRATORY:  Clear to auscultation without rales, wheezing or rhonchi  ABDOMEN: Soft, non-tender, non-distended MUSCULOSKELETAL:  No edema, warm SKIN: Warm and dry NEUROLOGIC:  Alert and oriented x 3 PSYCHIATRIC:  Normal affect   ASSESSMENT:    No diagnosis found.   PLAN:    In order of problems listed above:  #Severe AS s/p TAVR Underwent successful TAVR with 2mm Edwards Sapien 3 valve on 06/04/22. Post-procedure TTE with LVEF 60-65%, G1DD normal RV, trivial MR, well functioning TAVR prosthesis with mean gradient 12.70mmHg. Currently doing well. -Continue ASA 81 mg daily -Continue SBE ppx  #New LBBB: -Developed post-TAVR with no symptoms of high degree block  #History of TIA Patient had episode where she has a transient loss of vision where she felt like a "curtain came over her eyes" in 05/2021. This was bilateral and lasted about 10 seconds before resolving. Symptoms concerning for possible TIA. Cardiac monitor without Afib or arrhythmias. Carotids with minimal disease. MRI head with small vessel disease but no acute pathology. MRA head unremarkable. Has not had recurrence. Can consider ILR if another event. -Continue ASA 81mg  daily and lipitor 20mg  daily  #Mild nonobstructive CAD: #Strong family history CAD: Cath 04/2021 with mild disease. -Continue lipitor 20mg  daily -LDL at goal of 66  #Scleroderma #RA: #Sjogren's #Raynauds Normal RV size and function on TTE. PFTs normal 2018. Followed closely by rheum. -Follow-up with rheum as scheduled -Continue immunosuppressants  Follow-up:  6 months.  Medication  Adjustments/Labs and Tests Ordered: Current medicines are reviewed at length with the patient today.  Concerns regarding medicines are outlined above.   No orders of the defined types were placed in this encounter.  No orders of the defined types were placed in this encounter.  There are no Patient Instructions on file for this visit.   Signed, Meriam Sprague, MD  06/30/2022 4:18 PM    [facility]

## 2022-07-01 ENCOUNTER — Encounter (HOSPITAL_COMMUNITY)
Admission: RE | Admit: 2022-07-01 | Discharge: 2022-07-01 | Disposition: A | Discharge status: Home / Self Care | Payer: Medicare Other | Source: Ambulatory Visit | Attending: Cardiology | Admitting: Cardiology

## 2022-07-01 DIAGNOSIS — Z952 Presence of prosthetic heart valve: Secondary | ICD-10-CM

## 2022-07-01 NOTE — Progress Notes (Signed)
Daily Session Note  Patient Details  Name: Sonakshi Chambers MRN: [id number] Date of Birth: 02-09-1946 Referring Provider:   Flowsheet Row INTENSIVE CARDIAC REHAB ORIENT from 06/24/2022 in [facility]  Referring Provider Laurance Flatten, MD       Encounter Date: 07/01/2022  Check In:  Session Check In - 07/01/22 1333       Check-In   Supervising physician immediately available to respond to emergencies [facility] MD immediately available    Physician(s) Stan Head NP    Location [facility]    Staff Present Gladstone Lighter, RN, Marton Redwood, MS, ACSM-CEP, CCRP, Exercise Physiologist;Rebecca Roger Shelter, RN, BSN;Jetta Walker BS, ACSM-CEP, Exercise Physiologist;Olinty Peggye Pitt, MS, ACSM-CEP, Exercise Physiologist;Johnny Hale Bogus, MS, Exercise Physiologist;Bailey Wallace Cullens, MS, Exercise Physiologist    Virtual Visit No    Medication changes reported     No    Fall or balance concerns reported    No    Tobacco Cessation No Change    Current number of cigarettes/nicotine per day     0    Warm-up and Cool-down Performed as group-led instruction    Resistance Training Performed Yes    VAD Patient? No    PAD/SET Patient? No      Pain Assessment   Currently in Pain? No/denies    Pain Score 0-No pain             Capillary Blood Glucose: No results found for this or any previous visit (from the past 24 hour(s)).   Exercise Prescription Changes - 07/01/22 1600       Response to Exercise   Blood Pressure (Admit) 124/78    Blood Pressure (Exercise) 142/78    Blood Pressure (Exit) 106/78    Heart Rate (Admit) 85 bpm    Heart Rate (Exercise) 121 bpm    Heart Rate (Exit) 94 bpm    Rating of Perceived Exertion (Exercise) 13    Symptoms None    Comments Pt's first day in the CRP2 program    Duration Continue with 30 min of aerobic exercise without signs/symptoms of physical distress.    Intensity THRR  unchanged      Progression   Progression Continue to progress workloads to maintain intensity without signs/symptoms of physical distress.    Average METs 2.9      Resistance Training   Training Prescription Yes    Weight 1    Reps 10-15    Time 10 Minutes      Interval Training   Interval Training No      NuStep   Level 1    SPM 79    Minutes 15    METs 2.3      Track   Laps 19    Minutes 15    METs 3.43             Social History   Tobacco Use  Smoking Status Never  Smokeless Tobacco Never    Goals Met:  Exercise tolerated well No report of concerns or symptoms today Strength training completed today  Goals Unmet:  Not Applicable  Comments: Pt started cardiac rehab today.  Pt tolerated light exercise without difficulty. VSS, telemetry-Sinus Bundle branch, asymptomatic.  Medication list reconciled. Pt denies barriers to medicaiton compliance.  PSYCHOSOCIAL ASSESSMENT:  PHQ-1. Pt exhibits positive coping skills, hopeful outlook with supportive family. No psychosocial needs identified at this time, no psychosocial interventions necessary.    Pt enjoys reading  going to shows at [place], spending time with her grandchildren, pets and travelling.   Pt oriented to exercise equipment and routine.    Understanding verbalized. Thayer Headings RN BSN    Dr. Armanda Magic is Medical Director for Cardiac Rehab at [facility].

## 2022-07-03 ENCOUNTER — Encounter (HOSPITAL_COMMUNITY)
Admission: RE | Admit: 2022-07-03 | Discharge: 2022-07-03 | Disposition: A | Discharge status: Home / Self Care | Payer: Medicare Other | Source: Ambulatory Visit | Attending: Cardiology | Admitting: Cardiology

## 2022-07-03 DIAGNOSIS — Z952 Presence of prosthetic heart valve: Secondary | ICD-10-CM | POA: Diagnosis not present

## 2022-07-05 ENCOUNTER — Encounter (HOSPITAL_COMMUNITY)
Admission: RE | Admit: 2022-07-05 | Discharge: 2022-07-05 | Disposition: A | Discharge status: Home / Self Care | Payer: Medicare Other | Source: Ambulatory Visit | Attending: Cardiology | Admitting: Cardiology

## 2022-07-05 DIAGNOSIS — Z952 Presence of prosthetic heart valve: Secondary | ICD-10-CM

## 2022-07-08 ENCOUNTER — Other Ambulatory Visit (HOSPITAL_COMMUNITY): Payer: Medicare Other

## 2022-07-08 ENCOUNTER — Encounter (HOSPITAL_COMMUNITY)
Admission: RE | Admit: 2022-07-08 | Discharge: 2022-07-08 | Disposition: A | Discharge status: Home / Self Care | Payer: Medicare Other | Source: Ambulatory Visit | Attending: Cardiology | Admitting: Cardiology

## 2022-07-08 ENCOUNTER — Encounter: Payer: Self-pay | Admitting: Cardiology

## 2022-07-08 ENCOUNTER — Ambulatory Visit: Payer: Medicare Other | Attending: Cardiology | Admitting: Cardiology

## 2022-07-08 VITALS — BP 124/80 | Ht 64.0 in | Wt 145.8 lb

## 2022-07-08 DIAGNOSIS — I2584 Coronary atherosclerosis due to calcified coronary lesion: Secondary | ICD-10-CM

## 2022-07-08 DIAGNOSIS — M368 Systemic disorders of connective tissue in other diseases classified elsewhere: Secondary | ICD-10-CM

## 2022-07-08 DIAGNOSIS — M3500 Sicca syndrome, unspecified: Secondary | ICD-10-CM

## 2022-07-08 DIAGNOSIS — I7300 Raynaud's syndrome without gangrene: Secondary | ICD-10-CM

## 2022-07-08 DIAGNOSIS — Z952 Presence of prosthetic heart valve: Secondary | ICD-10-CM | POA: Diagnosis not present

## 2022-07-08 DIAGNOSIS — I2510 Atherosclerotic heart disease of native coronary artery without angina pectoris: Secondary | ICD-10-CM

## 2022-07-08 DIAGNOSIS — I350 Nonrheumatic aortic (valve) stenosis: Secondary | ICD-10-CM

## 2022-07-08 DIAGNOSIS — I10 Essential (primary) hypertension: Secondary | ICD-10-CM | POA: Diagnosis not present

## 2022-07-08 DIAGNOSIS — M349 Systemic sclerosis, unspecified: Secondary | ICD-10-CM

## 2022-07-08 DIAGNOSIS — I951 Orthostatic hypotension: Secondary | ICD-10-CM

## 2022-07-08 NOTE — Patient Instructions (Signed)
Medication Instructions:   Your physician recommends that you continue on your current medications as directed. Please refer to the Current Medication list given to you today.  *If you need a refill on your cardiac medications before your next appointment, please call your pharmacy*    Follow-Up: At [facility], you and your health needs are our priority.  As part of our continuing mission to provide you with exceptional heart care, we have created designated Provider Care Teams.  These Care Teams include your primary Cardiologist (physician) and Advanced Practice Providers (APPs -  Physician Assistants and Nurse Practitioners) who all work together to provide you with the care you need, when you need it.  We recommend signing up for the patient portal called "MyChart".  Sign up information is provided on this After Visit Summary.  MyChart is used to connect with patients for Virtual Visits (Telemedicine).  Patients are able to view lab/test results, encounter notes, upcoming appointments, etc.  Non-urgent messages can be sent to your provider as well.   To learn more about what you can do with MyChart, go to [email or web address].    Your next appointment:   1 year(s)  Provider:   Heather E Pemberton, MD       

## 2022-07-10 ENCOUNTER — Encounter (HOSPITAL_COMMUNITY)
Admission: RE | Admit: 2022-07-10 | Discharge: 2022-07-10 | Disposition: A | Discharge status: Home / Self Care | Payer: Medicare Other | Source: Ambulatory Visit | Attending: Cardiology | Admitting: Cardiology

## 2022-07-10 DIAGNOSIS — Z952 Presence of prosthetic heart valve: Secondary | ICD-10-CM

## 2022-07-10 NOTE — Progress Notes (Addendum)
Patient was noted to have a 2 and then  4 beat irregular beats during cool down at cardiac rehab. Patient asymptomatic. Will show today's ECG tracings to the onsite provider Eligha Bridegroom NP for review.reviewed, Instant message response from Eligha Bridegroom NP.  looks like it could have been non-sustained VT or WCT but it was brief and she has normal LV function on echo. As long as she is asymptomatic she can carry on. thanks!    2 mins   Will continue to monitor the patient throughout  the program. Thayer Headings RN BSN

## 2022-07-11 NOTE — Progress Notes (Signed)
[facility]   MULTIDISCIPLINARY HEART VALVE CLINIC                                     Cardiology Office Note:    Date:  07/16/2022   ID:  Regina Tate, DOB 1945/12/15, MRN [id number]  PCP:  Ollen Bowl, MD  [facility] Cardiologist:  Meriam Sprague, MD/ Dr. Lynnette Caffey, MD (TAVR)  [facility] Electrophysiologist:  None   Referring MD: Ollen Bowl, MD   Chief Complaint  Patient presents with   Follow-up    1 month s/p TAVR   History of Present Illness:    Regina Tate is a 77 y.o. female with a hx of  Sjogren's syndrome, Scleroderma, Raynaud's syndrome, Rheumatoid arthritis on Enbrel, methotrexate, and plaquenil and moderate aortic stenosis. Randomized to surveillance arm of PROGRESS trial found to have a with drop in EF and worsening symptoms who presented to [facility] on 06/04/22 for planned TAVR and is being seen today for one month follow up.    Regina Tate was initially seen by Dr. [name] in 2018 for possible enrollment into the pulmonary HTN screening program. At that time, she denied any history of cardiac problems with no symptoms. TTE and PFTs at that time without evidence of PAH or pulmonary fibrosis but noted mild aortic stenosis with peak gradient . She was recommended for yearly follow-up.    Echocardiogram from 11/30/19 showed interval progression in AS with severe valvular thickening and calcification, moderate AS with mean gradient and AVA 1.4cm2. LVEF remained normal at 60-65%. Ca score 119. Repeat echo 01/2021 performed for serial monitoring of AS that showed an EF 60-65%, G1DD, trivial MR, mild AR, moderate AS with AVA 0.83cm, mean gradient , Vmax 3.3, DI 0.33.    She then called the office with symptoms of decreased exercise capacity, fatigue, and SOB. At that time, she was referred to the structural heart team and PROGRESS TRIAL assessment for further evaluation. She randomized to the surveillance  arm and was monitored closely.    On follow up evaluation 02/2022 she was having symptoms of dyspnea on exertion and chest pain. Echo at that time with reduced LV function at 45%, mean grad 24.2 mmHg, peak grad 39.1 mmHg, AVA 0.87 cm2, DVI 0.31, SVI 39, BSA: 1.71 m2, and mild AI.    She was then evaluated by the multidisciplinary valve team and felt to have severe, symptomatic aortic stenosis and to be a suitable candidate and is s/p successful TAVR with a 23 mm Edwards Sapien 3 THV via the TF approach on 06/04/22. Post operative echo with normal LVEF, and stable valve function with a mean gradient at 12.18mmHg, and AVA by VTI at 3.22cm2.   She has done very well since TAVR. She is here today alone and reports that she has had no SOB, fatigue, or chest pain. She is back to exercising 6 days per week and also participates with [employer]. She denies palpitations, LE edema, orthopnea, dizziness, or syncope.    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 Lynnette Caffey and Dr. Laneta Simmers   Scleroderma [facility])    Sjogren's syndrome with keratoconjunctivitis sicca (HCC)    Vitamin D deficiency     Past Surgical History:  Procedure Laterality Date   DIAGNOSTIC LAPAROSCOPY     age 78   INTRAOPERATIVE TRANSTHORACIC ECHOCARDIOGRAM N/A 06/04/2022   Procedure: INTRAOPERATIVE TRANSTHORACIC ECHOCARDIOGRAM;  Surgeon: Orbie Pyo, MD;  Location: [facility] INVASIVE CV LAB;  Service: Open Heart Surgery;  Laterality: N/A;   RIGHT/LEFT HEART CATH AND CORONARY ANGIOGRAPHY N/A 04/25/2021   Procedure: RIGHT/LEFT HEART CATH AND CORONARY ANGIOGRAPHY;  Surgeon:  Orbie Pyo, MD;  Location: [facility] INVASIVE CV LAB;  Service: Cardiovascular;  Laterality: N/A;   TRANSCATHETER AORTIC VALVE REPLACEMENT, TRANSFEMORAL N/A 06/04/2022   Procedure: Transcatheter Aortic Valve Replacement, Transfemoral;  Surgeon: Orbie Pyo, MD;  Location: [facility] INVASIVE CV LAB;  Service: Open Heart Surgery;  Laterality: N/A;    Current Medications: Current Meds  Medication Sig   amLODipine (NORVASC) 5 MG tablet Take 5 mg by mouth daily.   amoxicillin (AMOXIL) 500 MG tablet Take 4 tablets (2,000 mg total) by mouth as directed. 1 HOUR PRIOR TO DENTAL APPOINTMENTS   Ascorbic Acid (VITAMIN C) 1000 MG tablet Take 1,000 mg by mouth daily.   aspirin EC 81 MG tablet Take 1 tablet (81 mg total) by mouth daily. Swallow whole.   atorvastatin (LIPITOR) 20 MG tablet Take 1 tablet (20 mg total) by mouth daily.   Biotin 5000 MCG TABS Take 1 capsule by mouth 3 (three) times a week.   CALCIUM PO Take 1,000 mg by mouth 2 (two) times daily. Gummies   Cholecalciferol (VITAMIN D) 2000 units CAPS Take 2,000 Units by mouth daily.   diclofenac Sodium (VOLTAREN) 1 % GEL Apply 1 Application topically daily as needed (pain).   ELDERBERRY PO Take 1 capsule by mouth daily.   ENBREL SURECLICK 50 MG/ML injection Inject 50 mg into the skin once a week.   famotidine (PEPCID) 20 MG tablet Take 20 mg by mouth 3 (three) times a week.   Flaxseed, Linseed, (FLAX SEED OIL) 1000 MG CAPS Take 1,000 mg by mouth daily.   fluticasone (FLONASE) 50 MCG/ACT nasal spray Place 1 spray into both nostrils 2 (two) times daily.   folic acid (FOLVITE) 1 MG tablet Take 1 mg by mouth daily.   hydroxychloroquine (PLAQUENIL) 200 MG tablet Take 200 mg by mouth 2 (two) times daily.   loratadine (CLARITIN) 10 MG tablet Take 10 mg by mouth daily as needed for allergies.   methotrexate (RHEUMATREX) 2.5 MG tablet Take 10 mg by mouth 2 (two) times a week. Thursday and Friday   Multiple Vitamin (MULTIVITAMIN) tablet Take 1 tablet by mouth  daily.   Omega-3 Fatty Acids (FISH OIL) 1000 MG CAPS Take 1,000 mg by mouth in the morning and at bedtime.   omeprazole (PRILOSEC) 40 MG capsule Take 40 mg by mouth 4 (four) times a week.   pilocarpine (SALAGEN) 5 MG tablet Take 5 mg by mouth 2 (two) times daily.   Polyethyl Glycol-Propyl Glycol (SYSTANE) 0.4-0.3 % SOLN Place 1 drop into both eyes daily as needed (lubricant).   Probiotic Product (PROBIOTIC PO) Take 1 capsule by mouth 2 (two) times a week.   Turmeric 450 MG CAPS Take 450 mg by mouth daily.     Allergies:   Codeine, Molds & smuts, and Pollen extract  Social History   Socioeconomic History   Marital status: Divorced    Spouse name: Not on file   Number of children: Not on file   Years of education: Not on file   Highest education level: Not on file  Occupational History   Not on file  Tobacco Use   Smoking status: Never   Smokeless tobacco: Never  Vaping Use   Vaping Use: Never used  Substance and Sexual Activity   Alcohol use: Never   Drug use: Never   Sexual activity: Not on file  Other Topics Concern   Not on file  Social History Narrative   Not on file   Social Determinants of Health   Financial Resource Strain: Not on file  Food Insecurity: No Food Insecurity (06/05/2022)   Hunger Vital Sign    Worried About Running Out of Food in the Last Year: Never true    Ran Out of Food in the Last Year: Never true  Transportation Needs: No Transportation Needs (06/05/2022)   PRAPARE - Administrator, Civil Service (Medical): No    Lack of Transportation (Non-Medical): No  Physical Activity: Not on file  Stress: Not on file  Social Connections: Not on file     Family History: The patient's family history is negative for Breast cancer.  ROS:   Please see the history of present illness.    All other systems reviewed and are negative.  EKGs/Labs/Other Studies Reviewed:    The following studies were reviewed today:  Echocardiogram  07/15/22:   1. Compared with the echo 05/2022, new wall motion abnormalities are now  present. Left ventricular ejection fraction, by estimation, is 45 to 50%.  The left ventricle has mildly decreased function. The left ventricle has  no regional wall motion  abnormalities. Left ventricular diastolic parameters are consistent with  Grade I diastolic dysfunction (impaired relaxation). The average left  ventricular global longitudinal strain is -20.1 %. The global longitudinal  strain is normal.   2. Right ventricular systolic function is normal. The right ventricular  size is normal.   3. The mitral valve is normal in structure. Trivial mitral valve  regurgitation. No evidence of mitral stenosis.   4. The aortic valve has been repaired/replaced. Aortic valve  regurgitation is not visualized. No aortic stenosis is present. There is a  23 mm Edwards Sapien prosthetic (TAVR) valve present in the aortic  position. Procedure Date: 06/05/2022. Echo findings   are consistent with normal structure and function of the aortic valve  prosthesis. Aortic valve area, by VTI measures 1.89 cm. Aortic valve mean  gradient measures 10.0 mmHg. Aortic valve Vmax measures 2.15 m/s.   5. The inferior vena cava is normal in size with greater than 50%  respiratory variability, suggesting right atrial pressure of 3 mmHg.    TAVR OPERATIVE NOTE     Date of Procedure:                06/04/2022   Preoperative Diagnosis:      Severe Aortic Stenosis    Postoperative Diagnosis:    Same    Procedure:        Transcatheter Aortic Valve Replacement - Percutaneous Right Transfemoral Approach             Edwards Sapien 3 Ultra Resilia THV (size 23 mm, model # 9755RSL, serial # [id number])              Co-Surgeons:  Alleen Borne, MD and Alverda Skeans, MD     Anesthesiologist:                  Glade Stanford, MD   Echocardiographer:              Burna Forts, MD   Pre-operative Echo  Findings: Severe aortic stenosis Normal left ventricular systolic function   Post-operative Echo Findings: No paravalvular leak Normal left ventricular systolic function _____________   Echo 06/05/22:    1. Left ventricular ejection fraction, by estimation, is 60 to 65%. Left  ventricular ejection fraction by 3D volume is 58 %. The left ventricle has  normal function. The left ventricle has no regional wall motion  abnormalities. Left ventricular diastolic   parameters are consistent with Grade I diastolic dysfunction (impaired  relaxation).   2. Right ventricular systolic function is normal. The right ventricular  size is normal.   3. The mitral valve is normal in structure. Trivial mitral valve  regurgitation. No evidence of mitral stenosis.   4. The aortic valve has been repaired/replaced. Aortic valve  regurgitation is not visualized. No aortic stenosis is present. There is a  23 mm Sapien prosthetic (TAVR) valve present in the aortic position.  Procedure Date: 06/04/2022. Aortic valve mean  gradient measures 12.2 mmHg. Aortic valve Vmax measures 2.37 m/s. Aortic  valve acceleration time measures 56 msec.   5. The inferior vena cava is normal in size with greater than 50%  respiratory variability, suggesting right atrial pressure of 3 mmHg.    EKG:  EKG is not ordered today.   Recent Labs: 05/31/2022: ALT 26; NT-Pro BNP 310 06/05/2022: BUN 12; Creatinine, Ser 0.63; Hemoglobin 10.9; Magnesium 1.8; Platelets 153; Potassium 3.9; Sodium 138   Recent Lipid Panel    Component Value Date/Time   CHOL 161 02/09/2020 0739   TRIG 68 02/09/2020 0739   HDL 90 02/09/2020 0739   CHOLHDL 1.8 02/09/2020 0739   LDLCALC 58 02/09/2020 0739   Physical Exam:    VS:  BP 120/72   Pulse 80   Ht 5\' 4"  (1.626 m)   Wt 145 lb (65.8 kg)   SpO2 100%   BMI 24.89 kg/m     Wt Readings from Last 3 Encounters:  07/15/22 145 lb (65.8 kg)  07/08/22 145 lb 12.8 oz (66.1 kg)  06/24/22 145 lb 8 oz (66  kg)    General: Well developed, well nourished, NAD Lungs:Clear to ausculation bilaterally. No wheezes, rales, or rhonchi. Breathing is unlabored. Cardiovascular: RRR with S1 S2. No murmurs Extremities: No edema. Neuro: Alert and oriented. No focal deficits. No facial asymmetry. MAE spontaneously. Psych: Responds to questions appropriately with normal affect.    ASSESSMENT/PLAN:    Severe AS: Patient doing well with NYHA class I symptoms s/p successful TAVR with a 23 mm Edwards Sapien 3 THV via the TF approach on 06/04/22. Echo today with worse LV function at 45-50% when compared to prior echo performed post TAVR at 60-65% with global longitudinal strain. Mean gradients are stable at (was 12.2), peak 18.43mmHg, and AVA by VTI at 1.89cm2. E[facility] at [facility] appointment with new LBBB however no dyssynchrony noted on recent echo. She has a hx of PVCs with ZIO from 06/2021 showing up to 4.3% VEs. Beta blocker offer today however patient reports no symptoms and wished to defer. Continue ASA monotherapy. Will require lifelong dental SBE with Amoxicillin. Will have Dr. Lynnette Caffey review echo. Will likely need to start low  dose beta blocker +/- repeat ZIO. Plan one year follow up with our team and primary cardiologist in the interm.    Sjogren's syndrome/scleroderma/raynaud's syndrome: Continue current regimen with Enbrel, methotrexate, and plaquenil    Medication Adjustments/Labs and Tests Ordered: Current medicines are reviewed at length with the patient today.  Concerns regarding medicines are outlined above.  No orders of the defined types were placed in this encounter.  No orders of the defined types were placed in this encounter.   Patient Instructions  Medication Instructions:  Your physician recommends that you continue on your current medications as directed. Please refer to the Current Medication list given to you today.  *If you need a refill on your cardiac medications before your next  appointment, please call your pharmacy*   Lab Work: NONE If you have labs (blood work) drawn today and your tests are completely normal, you will receive your results only by: MyChart Message (if you have MyChart) OR A paper copy in the mail If you have any lab test that is abnormal or we need to change your treatment, we will call you to review the results.   Testing/Procedures: NONE   Follow-Up: At [facility], you and your health needs are our priority.  As part of our continuing mission to provide you with exceptional heart care, we have created designated Provider Care Teams.  These Care Teams include your primary Cardiologist (physician) and Advanced Practice Providers (APPs -  Physician Assistants and Nurse Practitioners) who all work together to provide you with the care you need, when you need it.  We recommend signing up for the patient portal called "MyChart".  Sign up information is provided on this After Visit Summary.  MyChart is used to connect with patients for Virtual Visits (Telemedicine).  Patients are able to view lab/test results, encounter notes, upcoming appointments, etc.  Non-urgent messages can be sent to your provider as well.   To learn more about what you can do with MyChart, go to [email or web address].    Your next appointment:   1 year(s)  Provider:   Meriam Sprague, MD      Signed, Georgie Chard, NP  07/16/2022 9:37 AM    [facility]

## 2022-07-12 ENCOUNTER — Encounter (HOSPITAL_COMMUNITY)
Admission: RE | Admit: 2022-07-12 | Discharge: 2022-07-12 | Disposition: A | Discharge status: Home / Self Care | Payer: Medicare Other | Source: Ambulatory Visit | Attending: Cardiology | Admitting: Cardiology

## 2022-07-12 DIAGNOSIS — Z952 Presence of prosthetic heart valve: Secondary | ICD-10-CM | POA: Diagnosis not present

## 2022-07-15 ENCOUNTER — Ambulatory Visit (HOSPITAL_COMMUNITY): Payer: Medicare Other | Attending: Cardiovascular Disease

## 2022-07-15 ENCOUNTER — Other Ambulatory Visit: Payer: Self-pay | Admitting: Cardiology

## 2022-07-15 ENCOUNTER — Encounter (HOSPITAL_COMMUNITY)
Admission: RE | Admit: 2022-07-15 | Discharge: 2022-07-15 | Disposition: A | Discharge status: Home / Self Care | Payer: Medicare Other | Source: Ambulatory Visit | Attending: Cardiology | Admitting: Cardiology

## 2022-07-15 ENCOUNTER — Ambulatory Visit (INDEPENDENT_AMBULATORY_CARE_PROVIDER_SITE_OTHER): Payer: Medicare Other | Admitting: Cardiology

## 2022-07-15 VITALS — BP 120/72 | HR 80 | Ht 64.0 in | Wt 145.0 lb

## 2022-07-15 DIAGNOSIS — I350 Nonrheumatic aortic (valve) stenosis: Secondary | ICD-10-CM

## 2022-07-15 DIAGNOSIS — M349 Systemic sclerosis, unspecified: Secondary | ICD-10-CM | POA: Diagnosis present

## 2022-07-15 DIAGNOSIS — I10 Essential (primary) hypertension: Secondary | ICD-10-CM | POA: Insufficient documentation

## 2022-07-15 DIAGNOSIS — Z952 Presence of prosthetic heart valve: Secondary | ICD-10-CM

## 2022-07-15 DIAGNOSIS — M3500 Sicca syndrome, unspecified: Secondary | ICD-10-CM | POA: Diagnosis present

## 2022-07-15 LAB — ECHOCARDIOGRAM COMPLETE
AR max vel: 1.94 cm2
AV Area VTI: 1.89 cm2
AV Area mean vel: 1.94 cm2
AV Mean grad: 10 mmHg
AV Peak grad: 18.5 mmHg
Ao pk vel: 2.15 m/s
MV M vel: 4.61 m/s
MV Peak grad: 85 mmHg
S' Lateral: 3.2 cm

## 2022-07-15 NOTE — Patient Instructions (Signed)
Medication Instructions:  Your physician recommends that you continue on your current medications as directed. Please refer to the Current Medication list given to you today.  *If you need a refill on your cardiac medications before your next appointment, please call your pharmacy*   Lab Work: NONE If you have labs (blood work) drawn today and your tests are completely normal, you will receive your results only by: MyChart Message (if you have MyChart) OR A paper copy in the mail If you have any lab test that is abnormal or we need to change your treatment, we will call you to review the results.   Testing/Procedures: NONE   Follow-Up: At [facility], you and your health needs are our priority.  As part of our continuing mission to provide you with exceptional heart care, we have created designated Provider Care Teams.  These Care Teams include your primary Cardiologist (physician) and Advanced Practice Providers (APPs -  Physician Assistants and Nurse Practitioners) who all work together to provide you with the care you need, when you need it.  We recommend signing up for the patient portal called "MyChart".  Sign up information is provided on this After Visit Summary.  MyChart is used to connect with patients for Virtual Visits (Telemedicine).  Patients are able to view lab/test results, encounter notes, upcoming appointments, etc.  Non-urgent messages can be sent to your provider as well.   To learn more about what you can do with MyChart, go to [email or web address].    Your next appointment:   1 year(s)  Provider:   Heather E Pemberton, MD   

## 2022-07-16 ENCOUNTER — Telehealth: Payer: Self-pay | Admitting: Cardiology

## 2022-07-16 MED ORDER — METOPROLOL SUCCINATE ER 25 MG PO TB24: 25.0000 mg | ORAL_TABLET | Freq: Every day | ORAL | 3 refills | Status: DC

## 2022-07-16 NOTE — Telephone Encounter (Signed)
Called patient regarding echocardiogram results which shows a slight reduction in LV function to 45-50% from 60-65% on post TAVR echo. Patient does have a hx of frequent PVCs. She has previously been offered treatment in the past however has deferred given that she has been relatively asymptomatic. Given newly reduced EF plan to start Toprol XL 25mg  QD and see her back in 5-6 months. RX sent to [facility]. Patient agrees with this plan.    Georgie Chard NP-C Structural Heart Team  Pager: [phone] Phone: [phone]

## 2022-07-17 ENCOUNTER — Encounter (HOSPITAL_COMMUNITY)
Admission: RE | Admit: 2022-07-17 | Discharge: 2022-07-17 | Disposition: A | Discharge status: Home / Self Care | Payer: Medicare Other | Source: Ambulatory Visit | Attending: Cardiology | Admitting: Cardiology

## 2022-07-17 VITALS — BP 122/74 | HR 80 | Resp 14 | Wt 145.0 lb

## 2022-07-17 DIAGNOSIS — Z006 Encounter for examination for normal comparison and control in clinical research program: Secondary | ICD-10-CM

## 2022-07-17 DIAGNOSIS — Z952 Presence of prosthetic heart valve: Secondary | ICD-10-CM | POA: Diagnosis not present

## 2022-07-17 NOTE — Research (Addendum)
Progress 30 Day Post TAVR Visit   Current Outpatient Medications:    amLODipine (NORVASC) 5 MG tablet, Take 5 mg by mouth daily., Disp: , Rfl:    amoxicillin (AMOXIL) 500 MG tablet, Take 4 tablets (2,000 mg total) by mouth as directed. 1 HOUR PRIOR TO DENTAL APPOINTMENTS, Disp: 12 tablet, Rfl: 6   Ascorbic Acid (VITAMIN C) 1000 MG tablet, Take 1,000 mg by mouth daily., Disp: , Rfl:    aspirin EC 81 MG tablet, Take 1 tablet (81 mg total) by mouth daily. Swallow whole., Disp: 90 tablet, Rfl: 3   atorvastatin (LIPITOR) 20 MG tablet, Take 1 tablet (20 mg total) by mouth daily., Disp: 90 tablet, Rfl: 2   Biotin 5000 MCG TABS, Take 1 capsule by mouth 3 (three) times a week., Disp: , Rfl:    CALCIUM PO, Take 1,000 mg by mouth 2 (two) times daily. Gummies, Disp: , Rfl:    Cholecalciferol (VITAMIN D) 2000 units CAPS, Take 2,000 Units by mouth daily., Disp: , Rfl:    diclofenac Sodium (VOLTAREN) 1 % GEL, Apply 1 Application topically daily as needed (pain)., Disp: , Rfl:    ELDERBERRY PO, Take 1 capsule by mouth daily., Disp: , Rfl:    ENBREL SURECLICK 50 MG/ML injection, Inject 50 mg into the skin once a week., Disp: , Rfl:    famotidine (PEPCID) 20 MG tablet, Take 20 mg by mouth 3 (three) times a week., Disp: , Rfl:    Flaxseed, Linseed, (FLAX SEED OIL) 1000 MG CAPS, Take 1,000 mg by mouth daily., Disp: , Rfl:    fluticasone (FLONASE) 50 MCG/ACT nasal spray, Place 1 spray into both nostrils 2 (two) times daily., Disp: , Rfl:    folic acid (FOLVITE) 1 MG tablet, Take 1 mg by mouth daily., Disp: , Rfl:    hydroxychloroquine (PLAQUENIL) 200 MG tablet, Take 200 mg by mouth 2 (two) times daily., Disp: , Rfl:    loratadine (CLARITIN) 10 MG tablet, Take 10 mg by mouth daily as needed for allergies., Disp: , Rfl:    methotrexate (RHEUMATREX) 2.5 MG tablet, Take 10 mg by mouth 2 (two) times a week. Thursday and Friday, Disp: , Rfl:    metoprolol succinate (TOPROL XL) 25 MG 24 hr tablet, Take 1 tablet (25 mg  total) by mouth daily., Disp: 90 tablet, Rfl: 3   Multiple Vitamin (MULTIVITAMIN) tablet, Take 1 tablet by mouth daily., Disp: , Rfl:    Omega-3 Fatty Acids (FISH OIL) 1000 MG CAPS, Take 1,000 mg by mouth in the morning and at bedtime., Disp: , Rfl:    omeprazole (PRILOSEC) 40 MG capsule, Take 40 mg by mouth 4 (four) times a week., Disp: , Rfl:    pilocarpine (SALAGEN) 5 MG tablet, Take 5 mg by mouth 2 (two) times daily., Disp: , Rfl:    Polyethyl Glycol-Propyl Glycol (SYSTANE) 0.4-0.3 % SOLN, Place 1 drop into both eyes daily as needed (lubricant)., Disp: , Rfl:    Probiotic Product (PROBIOTIC PO), Take 1 capsule by mouth 2 (two) times a week., Disp: , Rfl:    Turmeric 450 MG CAPS, Take 450 mg by mouth daily., Disp: , Rfl:    New York Heart Association  (NYHA) Class l   Canadian Cardiovascular Society Score (CCS) angina grade: 0   Transthoracic echocardiogram (TTE) Completed 07-15-2022     12- lead electrocardiogram: Completed    Labs: See labs   KCCQ: See worksheet     EQ-[id number]: See worksheet  SF-36:  See worksheet

## 2022-07-18 ENCOUNTER — Other Ambulatory Visit: Payer: Self-pay | Admitting: Cardiology

## 2022-07-18 DIAGNOSIS — Z952 Presence of prosthetic heart valve: Secondary | ICD-10-CM

## 2022-07-18 DIAGNOSIS — I350 Nonrheumatic aortic (valve) stenosis: Secondary | ICD-10-CM

## 2022-07-19 ENCOUNTER — Encounter (HOSPITAL_COMMUNITY)
Admission: RE | Admit: 2022-07-19 | Discharge: 2022-07-19 | Disposition: A | Discharge status: Home / Self Care | Payer: Medicare Other | Source: Ambulatory Visit | Attending: Cardiology | Admitting: Cardiology

## 2022-07-19 DIAGNOSIS — Z952 Presence of prosthetic heart valve: Secondary | ICD-10-CM | POA: Diagnosis not present

## 2022-07-19 LAB — CBC
Hematocrit: 36.4 % (ref 34.0–46.6)
Hemoglobin: 11.9 g/dL (ref 11.1–15.9)
MCH: 29.1 pg (ref 26.6–33.0)
MCHC: 32.7 g/dL (ref 31.5–35.7)
MCV: 89 fL (ref 79–97)
Platelets: 188 10*3/uL (ref 150–450)
RBC: 4.09 x10E6/uL (ref 3.77–5.28)
RDW: 14.5 % (ref 11.7–15.4)
WBC: 5.6 10*3/uL (ref 3.4–10.8)

## 2022-07-19 LAB — BASIC METABOLIC PANEL
BUN/Creatinine Ratio: 25 (ref 12–28)
BUN: 18 mg/dL (ref 8–27)
CO2: 20 mmol/L (ref 20–29)
Calcium: 9 mg/dL (ref 8.7–10.3)
Chloride: 102 mmol/L (ref 96–106)
Creatinine, Ser: 0.72 mg/dL (ref 0.57–1.00)
Glucose: 103 mg/dL — ABNORMAL HIGH (ref 70–99)
Potassium: 4.5 mmol/L (ref 3.5–5.2)
Sodium: 137 mmol/L (ref 134–144)
eGFR: 87 mL/min/{1.73_m2} (ref >59)

## 2022-07-19 LAB — PRO B NATRIURETIC PEPTIDE: NT-Pro BNP: 368 pg/mL (ref 0–738)

## 2022-07-24 ENCOUNTER — Encounter (HOSPITAL_COMMUNITY)
Admission: RE | Admit: 2022-07-24 | Discharge: 2022-07-24 | Disposition: A | Discharge status: Home / Self Care | Payer: Medicare Other | Source: Ambulatory Visit | Attending: Cardiology | Admitting: Cardiology

## 2022-07-24 DIAGNOSIS — Z952 Presence of prosthetic heart valve: Secondary | ICD-10-CM | POA: Diagnosis not present

## 2022-07-26 ENCOUNTER — Encounter (HOSPITAL_COMMUNITY)
Admission: RE | Admit: 2022-07-26 | Discharge: 2022-07-26 | Disposition: A | Discharge status: Home / Self Care | Payer: Medicare Other | Source: Ambulatory Visit | Attending: Cardiology | Admitting: Cardiology

## 2022-07-26 DIAGNOSIS — Z952 Presence of prosthetic heart valve: Secondary | ICD-10-CM

## 2022-07-29 ENCOUNTER — Encounter (HOSPITAL_COMMUNITY)
Admission: RE | Admit: 2022-07-29 | Discharge: 2022-07-29 | Disposition: A | Discharge status: Home / Self Care | Payer: Medicare Other | Source: Ambulatory Visit | Attending: Cardiology | Admitting: Cardiology

## 2022-07-29 DIAGNOSIS — Z952 Presence of prosthetic heart valve: Secondary | ICD-10-CM | POA: Insufficient documentation

## 2022-07-30 NOTE — Progress Notes (Signed)
Cardiac Individual Treatment Plan  Patient Details  Name: Regina Tate MRN: [id number] Date of Birth: June 11, 1945 Referring Provider:   Flowsheet Row INTENSIVE CARDIAC REHAB ORIENT from 06/24/2022 in [facility]  Referring Provider Laurance Flatten, MD       Initial Encounter Date:  Flowsheet Row INTENSIVE CARDIAC REHAB ORIENT from 06/24/2022 in [facility]  Date 06/24/22       Visit Diagnosis: S/P TAVR (transcatheter aortic valve replacement)  Patient's Home Medications on Admission:  Current Outpatient Medications:    amLODipine (NORVASC) 5 MG tablet, Take 5 mg by mouth daily., Disp: , Rfl:    amoxicillin (AMOXIL) 500 MG tablet, Take 4 tablets (2,000 mg total) by mouth as directed. 1 HOUR PRIOR TO DENTAL APPOINTMENTS, Disp: 12 tablet, Rfl: 6   Ascorbic Acid (VITAMIN C) 1000 MG tablet, Take 1,000 mg by mouth daily., Disp: , Rfl:    aspirin EC 81 MG tablet, Take 1 tablet (81 mg total) by mouth daily. Swallow whole., Disp: 90 tablet, Rfl: 3   atorvastatin (LIPITOR) 20 MG tablet, Take 1 tablet (20 mg total) by mouth daily., Disp: 90 tablet, Rfl: 2   Biotin 5000 MCG TABS, Take 1 capsule by mouth 3 (three) times a week., Disp: , Rfl:    CALCIUM PO, Take 1,000 mg by mouth 2 (two) times daily. Gummies, Disp: , Rfl:    Cholecalciferol (VITAMIN D) 2000 units CAPS, Take 2,000 Units by mouth daily., Disp: , Rfl:    diclofenac Sodium (VOLTAREN) 1 % GEL, Apply 1 Application topically daily as needed (pain)., Disp: , Rfl:    ELDERBERRY PO, Take 1 capsule by mouth daily., Disp: , Rfl:    ENBREL SURECLICK 50 MG/ML injection, Inject 50 mg into the skin once a week., Disp: , Rfl:    famotidine (PEPCID) 20 MG tablet, Take 20 mg by mouth 3 (three) times a week., Disp: , Rfl:    Flaxseed, Linseed, (FLAX SEED OIL) 1000 MG CAPS, Take 1,000 mg by mouth daily., Disp: , Rfl:     fluticasone (FLONASE) 50 MCG/ACT nasal spray, Place 1 spray into both nostrils 2 (two) times daily., Disp: , Rfl:    folic acid (FOLVITE) 1 MG tablet, Take 1 mg by mouth daily., Disp: , Rfl:    hydroxychloroquine (PLAQUENIL) 200 MG tablet, Take 200 mg by mouth 2 (two) times daily., Disp: , Rfl:    loratadine (CLARITIN) 10 MG tablet, Take 10 mg by mouth daily as needed for allergies., Disp: , Rfl:    methotrexate (RHEUMATREX) 2.5 MG tablet, Take 10 mg by mouth 2 (two) times a week. Thursday and Friday, Disp: , Rfl:    metoprolol succinate (TOPROL XL) 25 MG 24 hr tablet, Take 1 tablet (25 mg total) by mouth daily., Disp: 90 tablet, Rfl: 3   Multiple Vitamin (MULTIVITAMIN) tablet, Take 1 tablet by mouth daily., Disp: , Rfl:    Omega-3 Fatty Acids (FISH OIL) 1000 MG CAPS, Take 1,000 mg by mouth in the morning and at bedtime., Disp: , Rfl:    omeprazole (PRILOSEC) 40 MG capsule, Take 40 mg by mouth 4 (four) times a week., Disp: , Rfl:    pilocarpine (SALAGEN) 5 MG tablet, Take 5 mg by mouth 2 (two) times daily., Disp: , Rfl:    Polyethyl Glycol-Propyl Glycol (SYSTANE) 0.4-0.3 % SOLN, Place 1 drop into both eyes daily as needed (lubricant)., Disp: , Rfl:  Probiotic Product (PROBIOTIC PO), Take 1 capsule by mouth 2 (two) times a week., Disp: , Rfl:    Turmeric 450 MG CAPS, Take 450 mg by mouth daily., Disp: , Rfl:   Past Medical History: Past Medical History:  Diagnosis Date   Allergic rhinitis    CREST syndrome (HCC)    Deep vein thrombosis (DVT) (HCC)    Dyspnea    GERD (gastroesophageal reflux disease)    Heart murmur    aortic stenosis   Hypercholesteremia    Hyperlipidemia    Osteopenia    RA (rheumatoid arthritis) (HCC)    Raynaud's syndrome without gangrene    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 Lynnette Caffey and Dr. Laneta Simmers   Scleroderma [facility])    Sjogren's syndrome with keratoconjunctivitis sicca (HCC)    Vitamin D deficiency      Tobacco Use: Social History   Tobacco Use  Smoking Status Never  Smokeless Tobacco Never    Labs: Review Flowsheet       Latest Ref Rng & Units 02/09/2020 04/25/2021 06/04/2022  Labs for ITP Cardiac and Pulmonary Rehab  Cholestrol 100 - 199 mg/dL 130  - -  LDL (calc) 0 - 99 mg/dL 58  - -  HDL-C >86 mg/dL 90  - -  Trlycerides 0 - 149 mg/dL 68  - -  PH, Arterial 5.78 - 7.45 - 7.360  7.382  -  PCO2 arterial 32 - 48 mmHg - 47.1  43.5  -  Bicarbonate 20.0 - 28.0 mmol/L - 26.4  26.6  25.8  -  TCO2 22 - 32 mmol/L - 28  28  27  28    O2 Saturation % - 80  82  98  -    Capillary Blood Glucose: No results found for: "GLUCAP"   Exercise Target Goals: Exercise Program Goal: Individual exercise prescription set using results from initial 6 min walk test and THRR while considering  patient's activity barriers and safety.   Exercise Prescription Goal: Initial exercise prescription builds to 30-45 minutes a day of aerobic activity, 2-3 days per week.  Home exercise guidelines will be given to patient during program as part of exercise prescription that the participant will acknowledge.  Activity Barriers & Risk Stratification:  Activity Barriers & Cardiac Risk Stratification - 06/24/22 1257       Activity Barriers & Cardiac Risk Stratification   Activity Barriers Joint Problems;History of Falls    Cardiac Risk Stratification High   <5 METs on            6 Minute Walk:  6 Minute Walk     Row Name 06/24/22 1256         6 Minute Walk   Phase Initial     Distance 1440 feet     Walk Time 6 minutes     # of Rest Breaks 0     MPH 2.72     METS 3.18     RPE 9     Perceived Dyspnea  0     VO2 Peak 11.14     Symptoms No     Resting HR 80 bpm     Resting BP 124/70     Resting Oxygen Saturation  99 %     Exercise Oxygen Saturation  during 6 min walk 98 %     Max Ex. HR 123 bpm     Max Ex. BP 132/78     2 Minute  Post BP 112/80              Oxygen Initial  Assessment:   Oxygen Re-Evaluation:   Oxygen Discharge (Final Oxygen Re-Evaluation):   Initial Exercise Prescription:  Initial Exercise Prescription - 06/24/22 1200       Date of Initial Exercise RX and Referring Provider   Date 06/24/22    Referring Provider Laurance Flatten, MD    Expected Discharge Date 09/06/22      Recumbant Bike   Level 1.5    RPM 50    Watts 30    Minutes 15    METs 2.5      Track   Laps 18    Minutes 15    METs 2.5      Prescription Details   Frequency (times per week) 3    Duration Progress to 30 minutes of continuous aerobic without signs/symptoms of physical distress      Intensity   THRR 40-80% of Max Heartrate 58-116    Ratings of Perceived Exertion 11-13    Perceived Dyspnea 0-4      Progression   Progression Continue progressive overload as per policy without signs/symptoms or physical distress.      Resistance Training   Training Prescription Yes    Weight 1    Reps 10-15             Perform Capillary Blood Glucose checks as needed.  Exercise Prescription Changes:   Exercise Prescription Changes     Row Name 07/01/22 1600 07/26/22 1400           Response to Exercise   Blood Pressure (Admit) 124/78 122/80      Blood Pressure (Exercise) 142/78 128/66      Blood Pressure (Exit) 106/78 118/72      Heart Rate (Admit) 85 bpm 84 bpm      Heart Rate (Exercise) 121 bpm 110 bpm      Heart Rate (Exit) 94 bpm 87 bpm      Rating of Perceived Exertion (Exercise) 13 12      Symptoms None None      Comments Pt's first day in the CRP2 program Reviewed METs      Duration Continue with 30 min of aerobic exercise without signs/symptoms of physical distress. Continue with 30 min of aerobic exercise without signs/symptoms of physical distress.      Intensity THRR unchanged THRR unchanged        Progression   Progression Continue to progress workloads to maintain intensity without signs/symptoms of physical distress. Continue to  progress workloads to maintain intensity without signs/symptoms of physical distress.      Average METs 2.9 3.2        Resistance Training   Training Prescription Yes Yes      Weight 1 3 lbs      Reps 10-15 10-15      Time 10 Minutes 10 Minutes        Interval Training   Interval Training No No        NuStep   Level 1 2      SPM 79 94      Minutes 15 15      METs 2.3 2.9        Track   Laps 19 19      Minutes 15 15      METs 3.43 3.43               Exercise Comments:  Exercise Comments     Row Name 07/01/22 1645 07/26/22 1437         Exercise Comments Pt's first day in the CRP2 program. Pt tolerated session well with no complaints Reviewed METs. Pt is making progress on improving her MET level. Pt will continue to use Nsutep but would like to change from the track. Will update her exercise Rx.               Exercise Goals and Review:   Exercise Goals     Row Name 06/24/22 0831             Exercise Goals   Increase Physical Activity Yes       Intervention Provide advice, education, support and counseling about physical activity/exercise needs.;Develop an individualized exercise prescription for aerobic and resistive training based on initial evaluation findings, risk stratification, comorbidities and participant's personal goals.       Expected Outcomes Short Term: Attend rehab on a regular basis to increase amount of physical activity.;Long Term: Exercising regularly at least 3-5 days a week.;Long Term: Add in home exercise to make exercise part of routine and to increase amount of physical activity.       Increase Strength and Stamina Yes       Intervention Provide advice, education, support and counseling about physical activity/exercise needs.;Develop an individualized exercise prescription for aerobic and resistive training based on initial evaluation findings, risk stratification, comorbidities and participant's personal goals.       Expected  Outcomes Short Term: Perform resistance training exercises routinely during rehab and add in resistance training at home;Short Term: Increase workloads from initial exercise prescription for resistance, speed, and METs.;Long Term: Improve cardiorespiratory fitness, muscular endurance and strength as measured by increased METs and functional capacity ( )       Able to understand and use rate of perceived exertion (RPE) scale Yes       Intervention Provide education and explanation on how to use RPE scale       Expected Outcomes Short Term: Able to use RPE daily in rehab to express subjective intensity level;Long Term:  Able to use RPE to guide intensity level when exercising independently       Knowledge and understanding of Target Heart Rate Range (THRR) Yes       Intervention Provide education and explanation of THRR including how the numbers were predicted and where they are located for reference       Expected Outcomes Short Term: Able to state/look up THRR;Short Term: Able to use daily as guideline for intensity in rehab;Long Term: Able to use THRR to govern intensity when exercising independently       Understanding of Exercise Prescription Yes       Intervention Provide education, explanation, and written materials on patient's individual exercise prescription       Expected Outcomes Short Term: Able to explain program exercise prescription;Long Term: Able to explain home exercise prescription to exercise independently                Exercise Goals Re-Evaluation :  Exercise Goals Re-Evaluation     Row Name 07/01/22 1644             Exercise Goal Re-Evaluation   Exercise Goals Review Increase Physical Activity;Increase Strength and Stamina;Able to understand and use rate of perceived exertion (RPE) scale;Knowledge and understanding of Target Heart Rate Range (THRR);Understanding of Exercise Prescription       Comments Pt's first day in the  CRP2 program. Pt undersntands the  exercise Rx, THRR, and RPE scale.       Expected Outcomes Will continue to monitor patient and progress exericse workloads as tolerated.                Discharge Exercise Prescription (Final Exercise Prescription Changes):  Exercise Prescription Changes - 07/26/22 1400       Response to Exercise   Blood Pressure (Admit) 122/80    Blood Pressure (Exercise) 128/66    Blood Pressure (Exit) 118/72    Heart Rate (Admit) 84 bpm    Heart Rate (Exercise) 110 bpm    Heart Rate (Exit) 87 bpm    Rating of Perceived Exertion (Exercise) 12    Symptoms None    Comments Reviewed METs    Duration Continue with 30 min of aerobic exercise without signs/symptoms of physical distress.    Intensity THRR unchanged      Progression   Progression Continue to progress workloads to maintain intensity without signs/symptoms of physical distress.    Average METs 3.2      Resistance Training   Training Prescription Yes    Weight 3 lbs    Reps 10-15    Time 10 Minutes      Interval Training   Interval Training No      NuStep   Level 2    SPM 94    Minutes 15    METs 2.9      Track   Laps 19    Minutes 15    METs 3.43             Nutrition:  Target Goals: Understanding of nutrition guidelines, daily intake of sodium 1500mg , cholesterol 200mg , calories 30% from fat and 7% or less from saturated fats, daily to have 5 or more servings of fruits and vegetables.  Biometrics:  Pre Biometrics - 06/24/22 0807       Pre Biometrics   Waist Circumference 36.25 inches    Hip Circumference 40.5 inches    Waist to Hip Ratio 0.9 %    Triceps Skinfold 28 mm    % Body Fat 38.6 %    Grip Strength 14 kg    Flexibility 14.75 in    Single Leg Stand 16.25 seconds              Nutrition Therapy Plan and Nutrition Goals:  Nutrition Therapy & Goals - 07/30/22 0921       Nutrition Therapy   Diet Heart Healthy Diet    Drug/Food Interactions Statins/Certain Fruits      Personal  Nutrition Goals   Nutrition Goal Patient to identify strategies for reducing cardiovascular risk by attending the [facility] education and nutrition series weekly.    Personal Goal #2 Patient to improve diet quality by using the plate method as a guide for meal planning to include lean protein/plant protein, fruits, vegetables, whole grains, nonfat dairy as part of a well-balanced diet.    Personal Goal #3 Patient to reduce sodium intake to 1500mg  per day    Comments Goals in action. Regina Tate continues to attend the [facility] education and nutrition series regularly. She is down 3.5# since starting with our program. Patient will benefit from participation in intensive cardiac rehab for nutrition, exercise, and lifestyle modification.      Intervention Plan   Intervention Prescribe, educate and counsel regarding individualized specific dietary modifications aiming towards targeted core components such as weight, hypertension, lipid management, diabetes, heart failure and other  comorbidities.;Nutrition handout(s) given to patient.    Expected Outcomes Short Term Goal: Understand basic principles of dietary content, such as calories, fat, sodium, cholesterol and nutrients.;Long Term Goal: Adherence to prescribed nutrition plan.             Nutrition Assessments:  Nutrition Assessments - 07/01/22 1418       Rate Your Plate Scores   Pre Score 72            MEDIFICTS Score Key: ?70 Need to make dietary changes  40-70 Heart Healthy Diet ? 40 Therapeutic Level Cholesterol Diet   Flowsheet Row INTENSIVE CARDIAC REHAB from 07/01/2022 in [facility]  Picture Your Plate Total Score on Admission 72      Picture Your Plate Scores: <60 Unhealthy dietary pattern with much room for improvement. 41-50 Dietary pattern unlikely to meet recommendations for good health and room for improvement. 51-60 More healthful dietary pattern, with some room  for improvement.  >60 Healthy dietary pattern, although there may be some specific behaviors that could be improved.    Nutrition Goals Re-Evaluation:  Nutrition Goals Re-Evaluation     Row Name 07/01/22 1412 07/30/22 0921           Goals   Current Weight 146 lb 13.2 oz (66.6 kg) 141 lb 15.6 oz (64.4 kg)      Comment lipids WNL lipids WNL, CBC WNL      Expected Outcome Regina Tate reports motivation to reduce sodium in her cooking. Answered questions regarding reading labels for sodium. Patient will benefit from participation in intensive cardiac rehab for nutrition, exercise, and lifestyle modification. Goals in action. Regina Tate continues to attend the [facility] education and nutrition series regularly. She is down 3.5# since starting with our program. Patient will benefit from participation in intensive cardiac rehab for nutrition, exercise, and lifestyle modification.               Nutrition Goals Re-Evaluation:  Nutrition Goals Re-Evaluation     Row Name 07/01/22 1412 07/30/22 0921           Goals   Current Weight 146 lb 13.2 oz (66.6 kg) 141 lb 15.6 oz (64.4 kg)      Comment lipids WNL lipids WNL, CBC WNL      Expected Outcome Regina Tate reports motivation to reduce sodium in her cooking. Answered questions regarding reading labels for sodium. Patient will benefit from participation in intensive cardiac rehab for nutrition, exercise, and lifestyle modification. Goals in action. Regina Tate continues to attend the [facility] education and nutrition series regularly. She is down 3.5# since starting with our program. Patient will benefit from participation in intensive cardiac rehab for nutrition, exercise, and lifestyle modification.               Nutrition Goals Discharge (Final Nutrition Goals Re-Evaluation):  Nutrition Goals Re-Evaluation - 07/30/22 4540       Goals   Current Weight 141 lb 15.6 oz (64.4 kg)    Comment lipids WNL, CBC WNL    Expected Outcome Goals in action. Regina Tate  continues to attend the [facility] education and nutrition series regularly. She is down 3.5# since starting with our program. Patient will benefit from participation in intensive cardiac rehab for nutrition, exercise, and lifestyle modification.             Psychosocial: Target Goals: Acknowledge presence or absence of significant depression and/or stress, maximize coping skills, provide positive support system. Participant is  able to verbalize types and ability to use techniques and skills needed for reducing stress and depression.  Initial Review & Psychosocial Screening:  Initial Psych Review & Screening - 06/24/22 1310       Initial Review   Current issues with None Identified      Family Dynamics   Good Support System? Yes   Regina Tate has her roommate for support     Barriers   Psychosocial barriers to participate in program There are no identifiable barriers or psychosocial needs.      Screening Interventions   Interventions Encouraged to exercise;Provide feedback about the scores to participant    Expected Outcomes Short Term goal: Identification and review with participant of any Quality of Life or Depression concerns found by scoring the questionnaire.;Long Term goal: The participant improves quality of Life and PHQ9 Scores as seen by post scores and/or verbalization of changes             Quality of Life Scores:  Quality of Life - 06/24/22 1310       Quality of Life   Select Quality of Life      Quality of Life Scores   Health/Function Pre 25.8 %    Socioeconomic Pre 27.25 %    Psych/Spiritual Pre 25.79 %    Family Pre 26 %    GLOBAL Pre 26.17 %            Scores of 19 and below usually indicate a poorer quality of life in these areas.  A difference of  2-3 points is a clinically meaningful difference.  A difference of 2-3 points in the total score of the Quality of Life Index has been associated with significant improvement in overall quality of life,  self-image, physical symptoms, and general health in studies assessing change in quality of life.  PHQ-9: Review Flowsheet       06/24/2022  Depression screen PHQ 2/9  Decreased Interest 0  Down, Depressed, Hopeless 0  PHQ - 2 Score 0  Altered sleeping 1  Tired, decreased energy 0  Change in appetite 0  Feeling bad or failure about yourself  0  Trouble concentrating 0  Moving slowly or fidgety/restless 0  Suicidal thoughts 0  PHQ-9 Score 1  Difficult doing work/chores Not difficult at all   Interpretation of Total Score  Total Score Depression Severity:  1-4 = Minimal depression, 5-9 = Mild depression, 10-14 = Moderate depression, 15-19 = Moderately severe depression, 20-27 = Severe depression   Psychosocial Evaluation and Intervention:   Psychosocial Re-Evaluation:  Psychosocial Re-Evaluation     Row Name 07/02/22 1730 07/30/22 1522           Psychosocial Re-Evaluation   Current issues with None Identified None Identified      Interventions Encouraged to attend Cardiac Rehabilitation for the exercise Encouraged to attend Cardiac Rehabilitation for the exercise      Continue Psychosocial Services  No Follow up required No Follow up required               Psychosocial Discharge (Final Psychosocial Re-Evaluation):  Psychosocial Re-Evaluation - 07/30/22 1522       Psychosocial Re-Evaluation   Current issues with None Identified    Interventions Encouraged to attend Cardiac Rehabilitation for the exercise    Continue Psychosocial Services  No Follow up required             Vocational Rehabilitation: Provide vocational rehab assistance to qualifying candidates.   Vocational Rehab Evaluation &  Intervention:  Vocational Rehab - 06/24/22 0831       Initial Vocational Rehab Evaluation & Intervention   Assessment shows need for Vocational Rehabilitation No   Regina Tate works part time and is retired full time            Education: Education Goals:  Education classes will be provided on a weekly basis, covering required topics. Participant will state understanding/return demonstration of topics presented.    Education     Row Name 07/01/22 1700     Education   Cardiac Education Topics [facility]   Select Workshops     Workshops   Educator Exercise Physiologist   Select Psychosocial   Psychosocial Workshop Healthy Sleep for a Healthy Heart   Instruction Review Code 1- Verbalizes Understanding   Class Start Time 1405   Class Stop Time 1502   Class Time Calculation (min) 57 min    Row Name 07/05/22 1400     Education   Cardiac Education Topics [facility]   Psychologist, forensic Exercise Education   Exercise Education Improving Performance   Instruction Review Code 1- Verbalizes Understanding   Class Start Time 1400   Class Stop Time 1432   Class Time Calculation (min) 32 min    Row Name 07/08/22 1600     Education   Cardiac Education Topics [facility]   Glass blower/designer Nutrition   Instruction Review Code 1- Verbalizes Understanding   Class Start Time 1400   Class Stop Time 1453   Class Time Calculation (min) 53 min    Row Name 07/10/22 1500     Education   Cardiac Education Topics [facility]   Customer service manager   Weekly Topic Simple Sides and Sauces   Instruction Review Code 1- Verbalizes Understanding   Class Start Time 1350   Class Stop Time 1425   Class Time Calculation (min) 35 min    Row Name 07/12/22 1600     Education   Cardiac Education Topics [facility]   Nurse, children's Exercise Physiologist   Select Psychosocial   Psychosocial How Our Thoughts Can Heal Our Hearts   Instruction Review Code 1- Verbalizes Understanding   Class Start Time 1400   Class Stop Time 1445   Class Time Calculation (min) 45 min    Row Name  07/15/22 1600     Education   Cardiac Education Topics [facility]   Geographical information systems officer Psychosocial   Psychosocial Workshop From Head to Heart: The Power of a Healthy Outlook   Instruction Review Code 1- Verbalizes Understanding   Class Start Time 1405   Class Stop Time 1503   Class Time Calculation (min) 58 min    Row Name 07/17/22 1500     Education   Cardiac Education Topics [facility]   Orthoptist   Educator Dietitian   Weekly Topic Powerhouse Plant-Based Proteins   Instruction Review Code 1- Verbalizes Understanding   Class Start Time 1355   Class Stop Time 1436   Class Time Calculation (min) 41 min    Row Name 07/19/22 1600     Education   Cardiac Education Topics [facility]  Select Core Videos     Core Videos   Educator Exercise Industrial/product designer Education   General Education Hypertension and Heart Disease   Instruction Review Code 1- Verbalizes Understanding   Class Start Time 1400   Class Stop Time 1442   Class Time Calculation (min) 42 min    Row Name 07/26/22 1600     Education   Cardiac Education Topics [facility]   Engineer, mining Education   General Education Heart Disease Risk Reduction   Instruction Review Code 1- Verbalizes Understanding   Class Start Time 1405   Class Stop Time 1450   Class Time Calculation (min) 45 min    Row Name 07/29/22 1600     Education   Cardiac Education Topics [facility]   Select Workshops     Workshops   Educator Exercise Physiologist   Select Exercise   Exercise Workshop Location manager and Fall Prevention   Instruction Review Code 1- Verbalizes Understanding   Class Start Time 1405   Class Stop Time 1445   Class Time Calculation (min) 40 min            Core Videos: Exercise    Move It!  Clinical staff conducted group or individual video education  with verbal and written material and guidebook.  Patient learns the recommended Pritikin exercise program. Exercise with the goal of living a long, healthy life. Some of the health benefits of exercise include controlled diabetes, healthier blood pressure levels, improved cholesterol levels, improved heart and lung capacity, improved sleep, and better body composition. Everyone should speak with their doctor before starting or changing an exercise routine.  Biomechanical Limitations Clinical staff conducted group or individual video education with verbal and written material and guidebook.  Patient learns how biomechanical limitations can impact exercise and how we can mitigate and possibly overcome limitations to have an impactful and balanced exercise routine.  Body Composition Clinical staff conducted group or individual video education with verbal and written material and guidebook.  Patient learns that body composition (ratio of muscle mass to fat mass) is a key component to assessing overall fitness, rather than body weight alone. Increased fat mass, especially visceral belly fat, can put [place] at increased risk for metabolic syndrome, type 2 diabetes, heart disease, and even death. It is recommended to combine diet and exercise (cardiovascular and resistance training) to improve your body composition. Seek guidance from your physician and exercise physiologist before implementing an exercise routine.  Exercise Action Plan Clinical staff conducted group or individual video education with verbal and written material and guidebook.  Patient learns the recommended strategies to achieve and enjoy long-term exercise adherence, including variety, self-motivation, self-efficacy, and positive decision making. Benefits of exercise include fitness, good health, weight management, more energy, better sleep, less stress, and overall well-being.  Medical   Heart Disease Risk Reduction Clinical staff conducted  group or individual video education with verbal and written material and guidebook.  Patient learns our heart is our most vital organ as it circulates oxygen, nutrients, white blood cells, and hormones throughout the entire body, and carries waste away. Data supports a plant-based eating plan like the Pritikin Program for its effectiveness in slowing progression of and reversing heart disease. The video provides a number of recommendations to address heart disease.   Metabolic Syndrome and Belly Fat  Clinical staff conducted group or individual video education with verbal and  written material and guidebook.  Patient learns what metabolic syndrome is, how it leads to heart disease, and how one can reverse it and keep it from coming back. You have metabolic syndrome if you have 3 of the following 5 criteria: abdominal obesity, high blood pressure, high triglycerides, low HDL cholesterol, and high blood sugar.  Hypertension and Heart Disease Clinical staff conducted group or individual video education with verbal and written material and guidebook.  Patient learns that high blood pressure, or hypertension, is very common in the [place]. Hypertension is largely due to excessive salt intake, but other important risk factors include being overweight, physical inactivity, drinking too much alcohol, smoking, and not eating enough potassium from fruits and vegetables. High blood pressure is a leading risk factor for heart attack, stroke, congestive heart failure, dementia, kidney failure, and premature death. Long-term effects of excessive salt intake include stiffening of the arteries and thickening of heart muscle and organ damage. Recommendations include ways to reduce hypertension and the risk of heart disease.  Diseases of Our Time - Focusing on Diabetes Clinical staff conducted group or individual video education with verbal and written material and guidebook.  Patient learns why the best way to stop  diseases of our time is prevention, through food and other lifestyle changes. Medicine (such as prescription pills and surgeries) is often only a Band-Aid on the problem, not a long-term solution. Most common diseases of our time include obesity, type 2 diabetes, hypertension, heart disease, and cancer. The Pritikin Program is recommended and has been proven to help reduce, reverse, and/or prevent the damaging effects of metabolic syndrome.  Nutrition   Overview of the Pritikin Eating Plan  Clinical staff conducted group or individual video education with verbal and written material and guidebook.  Patient learns about the Pritikin Eating Plan for disease risk reduction. The Pritikin Eating Plan emphasizes a wide variety of unrefined, minimally-processed carbohydrates, like fruits, vegetables, whole grains, and legumes. Go, Caution, and Stop food choices are explained. Plant-based and lean animal proteins are emphasized. Rationale provided for low sodium intake for blood pressure control, low added sugars for blood sugar stabilization, and low added fats and oils for coronary artery disease risk reduction and weight management.  Calorie Density  Clinical staff conducted group or individual video education with verbal and written material and guidebook.  Patient learns about calorie density and how it impacts the Pritikin Eating Plan. Knowing the characteristics of the food you choose will help you decide whether those foods will lead to weight gain or weight loss, and whether you want to consume more or less of them. Weight loss is usually a side effect of the Pritikin Eating Plan because of its focus on low calorie-dense foods.  Label Reading  Clinical staff conducted group or individual video education with verbal and written material and guidebook.  Patient learns about the Pritikin recommended label reading guidelines and corresponding recommendations regarding calorie density, added sugars, sodium  content, and whole grains.  Dining Out - Part 1  Clinical staff conducted group or individual video education with verbal and written material and guidebook.  Patient learns that restaurant meals can be sabotaging because they can be so high in calories, fat, sodium, and/or sugar. Patient learns recommended strategies on how to positively address this and avoid unhealthy pitfalls.  Facts on Fats  Clinical staff conducted group or individual video education with verbal and written material and guidebook.  Patient learns that lifestyle modifications can be just as effective,  if not more so, as many medications for lowering your risk of heart disease. A Pritikin lifestyle can help to reduce your risk of inflammation and atherosclerosis (cholesterol build-up, or plaque, in the artery walls). Lifestyle interventions such as dietary choices and physical activity address the cause of atherosclerosis. A review of the types of fats and their impact on blood cholesterol levels, along with dietary recommendations to reduce fat intake is also included.  Nutrition Action Plan  Clinical staff conducted group or individual video education with verbal and written material and guidebook.  Patient learns how to incorporate Pritikin recommendations into their lifestyle. Recommendations include planning and keeping personal health goals in mind as an important part of their success.  Healthy Mind-Set    Healthy Minds, Bodies, Hearts  Clinical staff conducted group or individual video education with verbal and written material and guidebook.  Patient learns how to identify when they are stressed. Video will discuss the impact of that stress, as well as the many benefits of stress management. Patient will also be introduced to stress management techniques. The way we think, act, and feel has an impact on our hearts.  How Our Thoughts Can Heal Our Hearts  Clinical staff conducted group or individual video education  with verbal and written material and guidebook.  Patient learns that negative thoughts can cause depression and anxiety. This can result in negative lifestyle behavior and serious health problems. Cognitive behavioral therapy is an effective method to help control our thoughts in order to change and improve our emotional outlook.  Additional Videos:  Exercise    Improving Performance  Clinical staff conducted group or individual video education with verbal and written material and guidebook.  Patient learns to use a non-linear approach by alternating intensity levels and lengths of time spent exercising to help burn more calories and lose more body fat. Cardiovascular exercise helps improve heart health, metabolism, hormonal balance, blood sugar control, and recovery from fatigue. Resistance training improves strength, endurance, balance, coordination, reaction time, metabolism, and muscle mass. Flexibility exercise improves circulation, posture, and balance. Seek guidance from your physician and exercise physiologist before implementing an exercise routine and learn your capabilities and proper form for all exercise.  Introduction to Yoga  Clinical staff conducted group or individual video education with verbal and written material and guidebook.  Patient learns about yoga, a discipline of the coming together of mind, breath, and body. The benefits of yoga include improved flexibility, improved range of motion, better posture and core strength, increased lung function, weight loss, and positive self-image. Yoga's heart health benefits include lowered blood pressure, healthier heart rate, decreased cholesterol and triglyceride levels, improved immune function, and reduced stress. Seek guidance from your physician and exercise physiologist before implementing an exercise routine and learn your capabilities and proper form for all exercise.  Medical   Aging: Enhancing Your Quality of Life  Clinical  staff conducted group or individual video education with verbal and written material and guidebook.  Patient learns key strategies and recommendations to stay in good physical health and enhance quality of life, such as prevention strategies, having an advocate, securing a Health Care Proxy and Power of Attorney, and keeping a list of medications and system for tracking them. It also discusses how to avoid risk for bone loss.  Biology of Weight Control  Clinical staff conducted group or individual video education with verbal and written material and guidebook.  Patient learns that weight gain occurs because we consume more calories than we  burn (eating more, moving less). Even if your body weight is normal, you may have higher ratios of fat compared to muscle mass. Too much body fat puts you at increased risk for cardiovascular disease, heart attack, stroke, type 2 diabetes, and obesity-related cancers. In addition to exercise, following the Pritikin Eating Plan can help reduce your risk.  Decoding Lab Results  Clinical staff conducted group or individual video education with verbal and written material and guidebook.  Patient learns that lab test reflects one measurement whose values change over time and are influenced by many factors, including medication, stress, sleep, exercise, food, hydration, pre-existing medical conditions, and more. It is recommended to use the knowledge from this video to become more involved with your lab results and evaluate your numbers to speak with your doctor.   Diseases of Our Time - Overview  Clinical staff conducted group or individual video education with verbal and written material and guidebook.  Patient learns that according to the CDC, 50% to 70% of chronic diseases (such as obesity, type 2 diabetes, elevated lipids, hypertension, and heart disease) are avoidable through lifestyle improvements including healthier food choices, listening to satiety cues, and  increased physical activity.  Sleep Disorders Clinical staff conducted group or individual video education with verbal and written material and guidebook.  Patient learns how good quality and duration of sleep are important to overall health and well-being. Patient also learns about sleep disorders and how they impact health along with recommendations to address them, including discussing with a physician.  Nutrition  Dining Out - Part 2 Clinical staff conducted group or individual video education with verbal and written material and guidebook.  Patient learns how to plan ahead and communicate in order to maximize their dining experience in a healthy and nutritious manner. Included are recommended food choices based on the type of restaurant the patient is visiting.   Fueling a Banker conducted group or individual video education with verbal and written material and guidebook.  There is a strong connection between our food choices and our health. Diseases like obesity and type 2 diabetes are very prevalent and are in large-part due to lifestyle choices. The Pritikin Eating Plan provides plenty of food and hunger-curbing satisfaction. It is easy to follow, affordable, and helps reduce health risks.  Menu Workshop  Clinical staff conducted group or individual video education with verbal and written material and guidebook.  Patient learns that restaurant meals can sabotage health goals because they are often packed with calories, fat, sodium, and sugar. Recommendations include strategies to plan ahead and to communicate with the manager, chef, or server to help order a healthier meal.  Planning Your Eating Strategy  Clinical staff conducted group or individual video education with verbal and written material and guidebook.  Patient learns about the Pritikin Eating Plan and its benefit of reducing the risk of disease. The Pritikin Eating Plan does not focus on calories.  Instead, it emphasizes high-quality, nutrient-rich foods. By knowing the characteristics of the foods, we choose, we can determine their calorie density and make informed decisions.  Targeting Your Nutrition Priorities  Clinical staff conducted group or individual video education with verbal and written material and guidebook.  Patient learns that lifestyle habits have a tremendous impact on disease risk and progression. This video provides eating and physical activity recommendations based on your personal health goals, such as reducing LDL cholesterol, losing weight, preventing or controlling type 2 diabetes, and reducing high blood pressure.  Vitamins and Minerals  Clinical staff conducted group or individual video education with verbal and written material and guidebook.  Patient learns different ways to obtain key vitamins and minerals, including through a recommended healthy diet. It is important to discuss all supplements you take with your doctor.   Healthy Mind-Set    Smoking Cessation  Clinical staff conducted group or individual video education with verbal and written material and guidebook.  Patient learns that cigarette smoking and tobacco addiction pose a serious health risk which affects millions of people. Stopping smoking will significantly reduce the risk of heart disease, lung disease, and many forms of cancer. Recommended strategies for quitting are covered, including working with your doctor to develop a successful plan.  Culinary   Becoming a Set designer conducted group or individual video education with verbal and written material and guidebook.  Patient learns that cooking at home can be healthy, cost-effective, quick, and puts them in control. Keys to cooking healthy recipes will include looking at your recipe, assessing your equipment needs, planning ahead, making it simple, choosing cost-effective seasonal ingredients, and limiting the use of added  fats, salts, and sugars.  Cooking - Breakfast and Snacks  Clinical staff conducted group or individual video education with verbal and written material and guidebook.  Patient learns how important breakfast is to satiety and nutrition through the entire day. Recommendations include key foods to eat during breakfast to help stabilize blood sugar levels and to prevent overeating at meals later in the day. Planning ahead is also a key component.  Cooking - Educational psychologist conducted group or individual video education with verbal and written material and guidebook.  Patient learns eating strategies to improve overall health, including an approach to cook more at home. Recommendations include thinking of animal protein as a side on your plate rather than center stage and focusing instead on lower calorie dense options like vegetables, fruits, whole grains, and plant-based proteins, such as beans. Making sauces in large quantities to freeze for later and leaving the skin on your vegetables are also recommended to maximize your experience.  Cooking - Healthy Salads and Dressing Clinical staff conducted group or individual video education with verbal and written material and guidebook.  Patient learns that vegetables, fruits, whole grains, and legumes are the foundations of the Pritikin Eating Plan. Recommendations include how to incorporate each of these in flavorful and healthy salads, and how to create homemade salad dressings. Proper handling of ingredients is also covered. Cooking - Soups and [employer] - Soups and Desserts Clinical staff conducted group or individual video education with verbal and written material and guidebook.  Patient learns that Pritikin soups and desserts make for easy, nutritious, and delicious snacks and meal components that are low in sodium, fat, sugar, and calorie density, while high in vitamins, minerals, and filling fiber. Recommendations include  simple and healthy ideas for soups and desserts.   Overview     The Pritikin Solution Program Overview Clinical staff conducted group or individual video education with verbal and written material and guidebook.  Patient learns that the results of the Pritikin Program have been documented in more than 100 articles published in peer-reviewed journals, and the benefits include reducing risk factors for (and, in some cases, even reversing) high cholesterol, high blood pressure, type 2 diabetes, obesity, and more! An overview of the three key pillars of the Pritikin Program will be covered: eating well, doing regular exercise, and  having a healthy mind-set.  WORKSHOPS  Exercise: Exercise Basics: Building Your Action Plan Clinical staff led group instruction and group discussion with PowerPoint presentation and patient guidebook. To enhance the learning environment the use of posters, models and videos may be added. At the conclusion of this workshop, patients will comprehend the difference between physical activity and exercise, as well as the benefits of incorporating both, into their routine. Patients will understand the FITT (Frequency, Intensity, Time, and Type) principle and how to use it to build an exercise action plan. In addition, safety concerns and other considerations for exercise and cardiac rehab will be addressed by the presenter. The purpose of this lesson is to promote a comprehensive and effective weekly exercise routine in order to improve patients' overall level of fitness.   Managing Heart Disease: Your Path to a Healthier Heart Clinical staff led group instruction and group discussion with PowerPoint presentation and patient guidebook. To enhance the learning environment the use of posters, models and videos may be added.At the conclusion of this workshop, patients will understand the anatomy and physiology of the heart. Additionally, they will understand how Pritikin's three  pillars impact the risk factors, the progression, and the management of heart disease.  The purpose of this lesson is to provide a high-level overview of the heart, heart disease, and how the Pritikin lifestyle positively impacts risk factors.  Exercise Biomechanics Clinical staff led group instruction and group discussion with PowerPoint presentation and patient guidebook. To enhance the learning environment the use of posters, models and videos may be added. Patients will learn how the structural parts of their bodies function and how these functions impact their daily activities, movement, and exercise. Patients will learn how to promote a neutral spine, learn how to manage pain, and identify ways to improve their physical movement in order to promote healthy living. The purpose of this lesson is to expose patients to common physical limitations that impact physical activity. Participants will learn practical ways to adapt and manage aches and pains, and to minimize their effect on regular exercise. Patients will learn how to maintain good posture while sitting, walking, and lifting.  Balance Training and Fall Prevention  Clinical staff led group instruction and group discussion with PowerPoint presentation and patient guidebook. To enhance the learning environment the use of posters, models and videos may be added. At the conclusion of this workshop, patients will understand the importance of their sensorimotor skills (vision, proprioception, and the vestibular system) in maintaining their ability to balance as they age. Patients will apply a variety of balancing exercises that are appropriate for their current level of function. Patients will understand the common causes for poor balance, possible solutions to these problems, and ways to modify their physical environment in order to minimize their fall risk. The purpose of this lesson is to teach patients about the importance of maintaining  balance as they age and ways to minimize their risk of falling.  WORKSHOPS   Nutrition:  Fueling a Ship broker led group instruction and group discussion with PowerPoint presentation and patient guidebook. To enhance the learning environment the use of posters, models and videos may be added. Patients will review the foundational principles of the Pritikin Eating Plan and understand what constitutes a serving size in each of the food groups. Patients will also learn Pritikin-friendly foods that are better choices when away from home and review make-ahead meal and snack options. Calorie density will be reviewed and applied to three nutrition  priorities: weight maintenance, weight loss, and weight gain. The purpose of this lesson is to reinforce (in a group setting) the key concepts around what patients are recommended to eat and how to apply these guidelines when away from home by planning and selecting Pritikin-friendly options. Patients will understand how calorie density may be adjusted for different weight management goals.  Mindful Eating  Clinical staff led group instruction and group discussion with PowerPoint presentation and patient guidebook. To enhance the learning environment the use of posters, models and videos may be added. Patients will briefly review the concepts of the Pritikin Eating Plan and the importance of low-calorie dense foods. The concept of mindful eating will be introduced as well as the importance of paying attention to internal hunger signals. Triggers for non-hunger eating and techniques for dealing with triggers will be explored. The purpose of this lesson is to provide patients with the opportunity to review the basic principles of the Pritikin Eating Plan, discuss the value of eating mindfully and how to measure internal cues of hunger and fullness using the Hunger Scale. Patients will also discuss reasons for non-hunger eating and learn strategies to use  for controlling emotional eating.  Targeting Your Nutrition Priorities Clinical staff led group instruction and group discussion with PowerPoint presentation and patient guidebook. To enhance the learning environment the use of posters, models and videos may be added. Patients will learn how to determine their genetic susceptibility to disease by reviewing their family history. Patients will gain insight into the importance of diet as part of an overall healthy lifestyle in mitigating the impact of genetics and other environmental insults. The purpose of this lesson is to provide patients with the opportunity to assess their personal nutrition priorities by looking at their family history, their own health history and current risk factors. Patients will also be able to discuss ways of prioritizing and modifying the Pritikin Eating Plan for their highest risk areas  Menu  Clinical staff led group instruction and group discussion with PowerPoint presentation and patient guidebook. To enhance the learning environment the use of posters, models and videos may be added. Using menus brought in from [employer], or printed from [employer], patients will apply the Pritikin dining out guidelines that were presented in the [employer] video. Patients will also be able to practice these guidelines in a variety of provided scenarios. The purpose of this lesson is to provide patients with the opportunity to practice hands-on learning of the Pritikin Dining Out guidelines with actual menus and practice scenarios.  Label Reading Clinical staff led group instruction and group discussion with PowerPoint presentation and patient guidebook. To enhance the learning environment the use of posters, models and videos may be added. Patients will review and discuss the Pritikin label reading guidelines presented in [employer]'s Label Reading Educational series video. Using fool labels brought in from local  grocery stores and markets, patients will apply the label reading guidelines and determine if the packaged food meet the Pritikin guidelines. The purpose of this lesson is to provide patients with the opportunity to review, discuss, and practice hands-on learning of the Pritikin Label Reading guidelines with actual packaged food labels. Cooking School  Pritikin's [employer] are designed to teach patients ways to prepare quick, simple, and affordable recipes at home. The importance of nutrition's role in chronic disease risk reduction is reflected in its emphasis in the overall Pritikin program. By learning how to prepare essential core Pritikin Eating Plan recipes,  patients will increase control over what they eat; be able to customize the flavor of foods without the use of added salt, sugar, or fat; and improve the quality of the food they consume. By learning a set of core recipes which are easily assembled, quickly prepared, and affordable, patients are more likely to prepare more healthy foods at home. These workshops focus on convenient breakfasts, simple entres, side dishes, and desserts which can be prepared with minimal effort and are consistent with nutrition recommendations for cardiovascular risk reduction. Cooking [employer] are taught by a Armed forces logistics/support/administrative officer (RD) who has been trained by the [employer]. The chef or RD has a clear understanding of the importance of minimizing - if not completely eliminating - added fat, sugar, and sodium in recipes. Throughout the series of Cooking School Workshop sessions, patients will learn about healthy ingredients and efficient methods of cooking to build confidence in their capability to prepare    Cooking School weekly topics:  Adding Flavor- Sodium-Free  Fast and Healthy Breakfasts  Powerhouse Plant-Based Proteins  Satisfying Salads and Dressings  Simple Sides and Sauces  International Cuisine-Spotlight on  the [employer] Zones  Delicious Desserts  Savory Soups  [employer] - Meals in a Astronomer Appetizers and Snacks  Comforting Weekend Breakfasts  One-Pot Wonders   Fast Evening Meals  Landscape architect Your Pritikin Plate  WORKSHOPS   Healthy Mindset (Psychosocial):  Focused Goals, Sustainable Changes Clinical staff led group instruction and group discussion with PowerPoint presentation and patient guidebook. To enhance the learning environment the use of posters, models and videos may be added. Patients will be able to apply effective goal setting strategies to establish at least one personal goal, and then take consistent, meaningful action toward that goal. They will learn to identify common barriers to achieving personal goals and develop strategies to overcome them. Patients will also gain an understanding of how our mind-set can impact our ability to achieve goals and the importance of cultivating a positive and growth-oriented mind-set. The purpose of this lesson is to provide patients with a deeper understanding of how to set and achieve personal goals, as well as the tools and strategies needed to overcome common obstacles which may arise along the way.  From Head to Heart: The Power of a Healthy Outlook  Clinical staff led group instruction and group discussion with PowerPoint presentation and patient guidebook. To enhance the learning environment the use of posters, models and videos may be added. Patients will be able to recognize and describe the impact of emotions and mood on physical health. They will discover the importance of self-care and explore self-care practices which may work for them. Patients will also learn how to utilize the 4 C's to cultivate a healthier outlook and better manage stress and challenges. The purpose of this lesson is to demonstrate to patients how a healthy outlook is an essential part of maintaining good health, especially as they  continue their cardiac rehab journey.  Healthy Sleep for a Healthy Heart Clinical staff led group instruction and group discussion with PowerPoint presentation and patient guidebook. To enhance the learning environment the use of posters, models and videos may be added. At the conclusion of this workshop, patients will be able to demonstrate knowledge of the importance of sleep to overall health, well-being, and quality of life. They will understand the symptoms of, and treatments for, common sleep disorders. Patients will also be able to identify daytime and  nighttime behaviors which impact sleep, and they will be able to apply these tools to help manage sleep-related challenges. The purpose of this lesson is to provide patients with a general overview of sleep and outline the importance of quality sleep. Patients will learn about a few of the most common sleep disorders. Patients will also be introduced to the concept of "sleep hygiene," and discover ways to self-manage certain sleeping problems through simple daily behavior changes. Finally, the workshop will motivate patients by clarifying the links between quality sleep and their goals of heart-healthy living.   Recognizing and Reducing Stress Clinical staff led group instruction and group discussion with PowerPoint presentation and patient guidebook. To enhance the learning environment the use of posters, models and videos may be added. At the conclusion of this workshop, patients will be able to understand the types of stress reactions, differentiate between acute and chronic stress, and recognize the impact that chronic stress has on their health. They will also be able to apply different coping mechanisms, such as reframing negative self-talk. Patients will have the opportunity to practice a variety of stress management techniques, such as deep abdominal breathing, progressive muscle relaxation, and/or guided imagery.  The purpose of this lesson is to  educate patients on the role of stress in their lives and to provide healthy techniques for coping with it.  Learning Barriers/Preferences:  Learning Barriers/Preferences - 06/24/22 1311       Learning Barriers/Preferences   Learning Barriers Sight   reading glasses   Learning Preferences Computer/Internet;Individual Instruction;Pictoral;Video;Written Material;Verbal Instruction;Skilled Demonstration;Group Instruction             Education Topics:  Knowledge Questionnaire Score:  Knowledge Questionnaire Score - 06/24/22 1311       Knowledge Questionnaire Score   Pre Score 23/24             Core Components/Risk Factors/Patient Goals at Admission:  Personal Goals and Risk Factors at Admission - 06/24/22 0831       Core Components/Risk Factors/Patient Goals on Admission    Weight Management Yes;Weight Maintenance    Intervention Weight Management: Develop a combined nutrition and exercise program designed to reach desired caloric intake, while maintaining appropriate intake of nutrient and fiber, sodium and fats, and appropriate energy expenditure required for the weight goal.;Weight Management: Provide education and appropriate resources to help participant work on and attain dietary goals.    Expected Outcomes Short Term: Continue to assess and modify interventions until short term weight is achieved;Long Term: Adherence to nutrition and physical activity/exercise program aimed toward attainment of established weight goal;Weight Maintenance: Understanding of the daily nutrition guidelines, which includes 25-35% calories from fat, 7% or less cal from saturated fats, less than 200mg  cholesterol, less than 1.5gm of sodium, & 5 or more servings of fruits and vegetables daily;Understanding recommendations for meals to include 15-35% energy as protein, 25-35% energy from fat, 35-60% energy from carbohydrates, less than 200mg  of dietary cholesterol, 20-35 gm of total fiber  daily;Understanding of distribution of calorie intake throughout the day with the consumption of 4-5 meals/snacks    Hypertension Yes    Intervention Provide education on lifestyle modifcations including regular physical activity/exercise, weight management, moderate sodium restriction and increased consumption of fresh fruit, vegetables, and low fat dairy, alcohol moderation, and smoking cessation.;Monitor prescription use compliance.    Expected Outcomes Short Term: Continued assessment and intervention until BP is < 140/28mm HG in hypertensive participants. < 130/10mm HG in hypertensive participants with diabetes, heart failure or chronic  kidney disease.;Long Term: Maintenance of blood pressure at goal levels.    Lipids Yes    Intervention Provide education and support for participant on nutrition & aerobic/resistive exercise along with prescribed medications to achieve LDL 70mg , HDL >40mg .    Expected Outcomes Short Term: Participant states understanding of desired cholesterol values and is compliant with medications prescribed. Participant is following exercise prescription and nutrition guidelines.;Long Term: Cholesterol controlled with medications as prescribed, with individualized exercise RX and with personalized nutrition plan. Value goals: LDL < 70mg , HDL > 40 mg.             Core Components/Risk Factors/Patient Goals Review:   Goals and Risk Factor Review     Row Name 07/02/22 1730 07/30/22 1523           Core Components/Risk Factors/Patient Goals Review   Personal Goals Review Weight Management/Obesity;Hypertension;Lipids Weight Management/Obesity;Hypertension;Lipids      Review Regina Tate started intensive cardiac rehab on 07/01/22 and did well with exercise Regina Tate is doing well with exercise  intensive cardiac rehab. Vital signs have been stable. No tachyarrythmias have been noted recently will continue to montior.      Expected Outcomes Regina Tate will continue to participate in  intensive cardiac rehab for exercise, nutrition and lifestyle modifications Regina Tate will continue to participate in intensive cardiac rehab for exercise, nutrition and lifestyle modifications               Core Components/Risk Factors/Patient Goals at Discharge (Final Review):   Goals and Risk Factor Review - 07/30/22 1523       Core Components/Risk Factors/Patient Goals Review   Personal Goals Review Weight Management/Obesity;Hypertension;Lipids    Review Regina Tate is doing well with exercise  intensive cardiac rehab. Vital signs have been stable. No tachyarrythmias have been noted recently will continue to montior.    Expected Outcomes Regina Tate will continue to participate in intensive cardiac rehab for exercise, nutrition and lifestyle modifications             ITP Comments:  ITP Comments     Row Name 06/24/22 0808 07/02/22 1728 07/30/22 1521       ITP Comments Dr. Armanda Magic medical director. Introduction to pritikin education program/ intensive cardiac rehab. Initial orientation packet reviewed with patient. 30 Day ITP Review. Regina Tate started intensive cardiac rehab on 07/01/22 and did well with exercise 30 Day ITP Review. Regina Tate has good attendance and participation in intensive cardiac rehab.              Comments: See ITP comments.

## 2022-07-31 ENCOUNTER — Encounter (HOSPITAL_COMMUNITY)
Admission: RE | Admit: 2022-07-31 | Discharge: 2022-07-31 | Disposition: A | Discharge status: Home / Self Care | Payer: Medicare Other | Source: Ambulatory Visit | Attending: Cardiology | Admitting: Cardiology

## 2022-07-31 DIAGNOSIS — Z952 Presence of prosthetic heart valve: Secondary | ICD-10-CM

## 2022-08-02 ENCOUNTER — Encounter (HOSPITAL_COMMUNITY)
Admission: RE | Admit: 2022-08-02 | Discharge: 2022-08-02 | Disposition: A | Discharge status: Home / Self Care | Payer: Medicare Other | Source: Ambulatory Visit | Attending: Cardiology | Admitting: Cardiology

## 2022-08-02 DIAGNOSIS — Z952 Presence of prosthetic heart valve: Secondary | ICD-10-CM

## 2022-08-05 ENCOUNTER — Encounter (HOSPITAL_COMMUNITY)
Admission: RE | Admit: 2022-08-05 | Discharge: 2022-08-05 | Disposition: A | Discharge status: Home / Self Care | Payer: Medicare Other | Source: Ambulatory Visit | Attending: Cardiology | Admitting: Cardiology

## 2022-08-05 DIAGNOSIS — Z952 Presence of prosthetic heart valve: Secondary | ICD-10-CM | POA: Diagnosis not present

## 2022-08-07 ENCOUNTER — Encounter (HOSPITAL_COMMUNITY)
Admission: RE | Admit: 2022-08-07 | Discharge: 2022-08-07 | Disposition: A | Discharge status: Home / Self Care | Payer: Medicare Other | Source: Ambulatory Visit | Attending: Cardiology | Admitting: Cardiology

## 2022-08-07 DIAGNOSIS — Z952 Presence of prosthetic heart valve: Secondary | ICD-10-CM | POA: Diagnosis not present

## 2022-08-08 ENCOUNTER — Telehealth (HOSPITAL_COMMUNITY): Payer: Self-pay

## 2022-08-08 NOTE — Telephone Encounter (Signed)
Spoke with the patient, detailed instructions given. She stated that she would be here for her test. Asked to call back with any questions. S.Kyrene Longan EMTP/CCT 

## 2022-08-08 NOTE — Progress Notes (Signed)
Reviewed home exercise Rx with patient today.  Encouraged warm-up, cool-down, and stretching. Reviewed THRR of  58 - 116 and keeping RPE between 11-13. Encouraged to hydrate with activity.  Reviewed weather parameters for temperature and humidity for safe exercise outdoors. Reviewed S/S to terminate exercise and when to call 911 vs MD. Pt encouraged to always carry a cell phone for safety when exercising outdoors. Pt verbalized understanding of the home exercise Rx and was provided a copy.   Lorin Picket MS, ACSM-CEP, CCRP

## 2022-08-09 ENCOUNTER — Encounter (HOSPITAL_COMMUNITY)
Admission: RE | Admit: 2022-08-09 | Discharge: 2022-08-09 | Disposition: A | Discharge status: Home / Self Care | Payer: Medicare Other | Source: Ambulatory Visit | Attending: Cardiology | Admitting: Cardiology

## 2022-08-09 DIAGNOSIS — Z952 Presence of prosthetic heart valve: Secondary | ICD-10-CM

## 2022-08-12 ENCOUNTER — Encounter (HOSPITAL_COMMUNITY)
Admission: RE | Admit: 2022-08-12 | Discharge: 2022-08-12 | Disposition: A | Discharge status: Home / Self Care | Payer: Medicare Other | Source: Ambulatory Visit | Attending: Cardiology | Admitting: Cardiology

## 2022-08-12 DIAGNOSIS — Z952 Presence of prosthetic heart valve: Secondary | ICD-10-CM | POA: Diagnosis not present

## 2022-08-14 ENCOUNTER — Encounter (HOSPITAL_COMMUNITY): Payer: Medicare Other

## 2022-08-14 ENCOUNTER — Ambulatory Visit (HOSPITAL_COMMUNITY): Payer: Medicare Other | Attending: Cardiology

## 2022-08-14 DIAGNOSIS — Z952 Presence of prosthetic heart valve: Secondary | ICD-10-CM | POA: Insufficient documentation

## 2022-08-14 DIAGNOSIS — I350 Nonrheumatic aortic (valve) stenosis: Secondary | ICD-10-CM | POA: Diagnosis present

## 2022-08-14 LAB — MYOCARDIAL AMYLOID PLANAR & SPECT: H/CL Ratio: 1.17

## 2022-08-14 MED ORDER — TECHNETIUM TC 99M PYROPHOSPHATE
21.0000 | Freq: Once | INTRAVENOUS | Status: AC
Start: 2022-08-14 — End: 2022-08-14
  Administered 2022-08-14: 21 via INTRAVENOUS

## 2022-08-16 ENCOUNTER — Encounter (HOSPITAL_COMMUNITY)
Admission: RE | Admit: 2022-08-16 | Discharge: 2022-08-16 | Disposition: A | Discharge status: Home / Self Care | Payer: Medicare Other | Source: Ambulatory Visit | Attending: Cardiology | Admitting: Cardiology

## 2022-08-16 DIAGNOSIS — Z952 Presence of prosthetic heart valve: Secondary | ICD-10-CM

## 2022-08-19 ENCOUNTER — Encounter (HOSPITAL_COMMUNITY)
Admission: RE | Admit: 2022-08-19 | Discharge: 2022-08-19 | Disposition: A | Discharge status: Home / Self Care | Payer: Medicare Other | Source: Ambulatory Visit | Attending: Cardiology | Admitting: Cardiology

## 2022-08-19 DIAGNOSIS — Z952 Presence of prosthetic heart valve: Secondary | ICD-10-CM | POA: Diagnosis not present

## 2022-08-21 ENCOUNTER — Encounter (HOSPITAL_COMMUNITY)
Admission: RE | Admit: 2022-08-21 | Discharge: 2022-08-21 | Disposition: A | Discharge status: Home / Self Care | Payer: Medicare Other | Source: Ambulatory Visit | Attending: Cardiology | Admitting: Cardiology

## 2022-08-21 DIAGNOSIS — Z952 Presence of prosthetic heart valve: Secondary | ICD-10-CM | POA: Diagnosis not present

## 2022-08-23 ENCOUNTER — Encounter (HOSPITAL_COMMUNITY)
Admission: RE | Admit: 2022-08-23 | Discharge: 2022-08-23 | Disposition: A | Discharge status: Home / Self Care | Payer: Medicare Other | Source: Ambulatory Visit | Attending: Cardiology | Admitting: Cardiology

## 2022-08-23 DIAGNOSIS — Z952 Presence of prosthetic heart valve: Secondary | ICD-10-CM

## 2022-08-26 ENCOUNTER — Encounter (HOSPITAL_COMMUNITY)
Admission: RE | Admit: 2022-08-26 | Discharge: 2022-08-26 | Disposition: A | Discharge status: Home / Self Care | Payer: Medicare Other | Source: Ambulatory Visit | Attending: Cardiology | Admitting: Cardiology

## 2022-08-26 DIAGNOSIS — Z952 Presence of prosthetic heart valve: Secondary | ICD-10-CM | POA: Insufficient documentation

## 2022-08-28 ENCOUNTER — Other Ambulatory Visit (HOSPITAL_COMMUNITY): Payer: Self-pay

## 2022-08-28 ENCOUNTER — Encounter (HOSPITAL_COMMUNITY): Payer: Medicare Other

## 2022-08-28 ENCOUNTER — Encounter (HOSPITAL_COMMUNITY): Payer: Self-pay | Admitting: Cardiology

## 2022-08-28 ENCOUNTER — Ambulatory Visit (HOSPITAL_COMMUNITY)
Admission: RE | Admit: 2022-08-28 | Discharge: 2022-08-28 | Disposition: A | Discharge status: Home / Self Care | Payer: Medicare Other | Source: Ambulatory Visit | Attending: Cardiology | Admitting: Cardiology

## 2022-08-28 VITALS — BP 130/70 | HR 85 | Wt 139.0 lb

## 2022-08-28 DIAGNOSIS — Z952 Presence of prosthetic heart valve: Secondary | ICD-10-CM | POA: Diagnosis not present

## 2022-08-28 DIAGNOSIS — Z79899 Other long term (current) drug therapy: Secondary | ICD-10-CM | POA: Insufficient documentation

## 2022-08-28 DIAGNOSIS — M3500 Sicca syndrome, unspecified: Secondary | ICD-10-CM | POA: Diagnosis not present

## 2022-08-28 DIAGNOSIS — I5020 Unspecified systolic (congestive) heart failure: Secondary | ICD-10-CM | POA: Insufficient documentation

## 2022-08-28 DIAGNOSIS — I5022 Chronic systolic (congestive) heart failure: Secondary | ICD-10-CM

## 2022-08-28 DIAGNOSIS — I7300 Raynaud's syndrome without gangrene: Secondary | ICD-10-CM | POA: Insufficient documentation

## 2022-08-28 DIAGNOSIS — M349 Systemic sclerosis, unspecified: Secondary | ICD-10-CM | POA: Diagnosis not present

## 2022-08-28 DIAGNOSIS — M069 Rheumatoid arthritis, unspecified: Secondary | ICD-10-CM | POA: Insufficient documentation

## 2022-08-28 DIAGNOSIS — M368 Systemic disorders of connective tissue in other diseases classified elsewhere: Secondary | ICD-10-CM

## 2022-08-28 MED ORDER — EMPAGLIFLOZIN 10 MG PO TABS: 10.0000 mg | ORAL_TABLET | Freq: Every day | ORAL | 1 refills | Status: DC

## 2022-08-28 NOTE — Progress Notes (Signed)
Cardiac Individual Treatment Plan  Patient Details  Name: Soua Mcmenemy MRN: [id number] Date of Birth: June 11, 1945 Referring Provider:   Flowsheet Row INTENSIVE CARDIAC REHAB ORIENT from 06/24/2022 in [facility]  Referring Provider Laurance Flatten, MD       Initial Encounter Date:  Flowsheet Row INTENSIVE CARDIAC REHAB ORIENT from 06/24/2022 in [facility]  Date 06/24/22       Visit Diagnosis: S/P TAVR (transcatheter aortic valve replacement)  Patient's Home Medications on Admission:  Current Outpatient Medications:    amLODipine (NORVASC) 5 MG tablet, Take 5 mg by mouth daily., Disp: , Rfl:    amoxicillin (AMOXIL) 500 MG tablet, Take 4 tablets (2,000 mg total) by mouth as directed. 1 HOUR PRIOR TO DENTAL APPOINTMENTS, Disp: 12 tablet, Rfl: 6   Ascorbic Acid (VITAMIN C) 1000 MG tablet, Take 1,000 mg by mouth daily., Disp: , Rfl:    aspirin EC 81 MG tablet, Take 1 tablet (81 mg total) by mouth daily. Swallow whole., Disp: 90 tablet, Rfl: 3   atorvastatin (LIPITOR) 20 MG tablet, Take 1 tablet (20 mg total) by mouth daily., Disp: 90 tablet, Rfl: 2   Biotin 5000 MCG TABS, Take 1 capsule by mouth 3 (three) times a week., Disp: , Rfl:    CALCIUM PO, Take 1,000 mg by mouth 2 (two) times daily. Gummies, Disp: , Rfl:    Cholecalciferol (VITAMIN D) 2000 units CAPS, Take 2,000 Units by mouth daily., Disp: , Rfl:    diclofenac Sodium (VOLTAREN) 1 % GEL, Apply 1 Application topically daily as needed (pain)., Disp: , Rfl:    ELDERBERRY PO, Take 1 capsule by mouth daily., Disp: , Rfl:    ENBREL SURECLICK 50 MG/ML injection, Inject 50 mg into the skin once a week., Disp: , Rfl:    famotidine (PEPCID) 20 MG tablet, Take 20 mg by mouth 3 (three) times a week., Disp: , Rfl:    Flaxseed, Linseed, (FLAX SEED OIL) 1000 MG CAPS, Take 1,000 mg by mouth daily., Disp: , Rfl:     fluticasone (FLONASE) 50 MCG/ACT nasal spray, Place 1 spray into both nostrils 2 (two) times daily., Disp: , Rfl:    folic acid (FOLVITE) 1 MG tablet, Take 1 mg by mouth daily., Disp: , Rfl:    hydroxychloroquine (PLAQUENIL) 200 MG tablet, Take 200 mg by mouth 2 (two) times daily., Disp: , Rfl:    loratadine (CLARITIN) 10 MG tablet, Take 10 mg by mouth daily as needed for allergies., Disp: , Rfl:    methotrexate (RHEUMATREX) 2.5 MG tablet, Take 10 mg by mouth 2 (two) times a week. Thursday and Friday, Disp: , Rfl:    metoprolol succinate (TOPROL XL) 25 MG 24 hr tablet, Take 1 tablet (25 mg total) by mouth daily., Disp: 90 tablet, Rfl: 3   Multiple Vitamin (MULTIVITAMIN) tablet, Take 1 tablet by mouth daily., Disp: , Rfl:    Omega-3 Fatty Acids (FISH OIL) 1000 MG CAPS, Take 1,000 mg by mouth in the morning and at bedtime., Disp: , Rfl:    omeprazole (PRILOSEC) 40 MG capsule, Take 40 mg by mouth 4 (four) times a week., Disp: , Rfl:    pilocarpine (SALAGEN) 5 MG tablet, Take 5 mg by mouth 2 (two) times daily., Disp: , Rfl:    Polyethyl Glycol-Propyl Glycol (SYSTANE) 0.4-0.3 % SOLN, Place 1 drop into both eyes daily as needed (lubricant)., Disp: , Rfl:  Probiotic Product (PROBIOTIC PO), Take 1 capsule by mouth 2 (two) times a week., Disp: , Rfl:    Turmeric 450 MG CAPS, Take 450 mg by mouth daily., Disp: , Rfl:   Past Medical History: Past Medical History:  Diagnosis Date   Allergic rhinitis    CREST syndrome (HCC)    Deep vein thrombosis (DVT) (HCC)    Dyspnea    GERD (gastroesophageal reflux disease)    Heart murmur    aortic stenosis   Hypercholesteremia    Hyperlipidemia    Osteopenia    RA (rheumatoid arthritis) (HCC)    Raynaud's syndrome without gangrene    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 Lynnette Caffey and Dr. Laneta Simmers   Scleroderma [facility])    Sjogren's syndrome with keratoconjunctivitis sicca (HCC)    Vitamin D deficiency      Tobacco Use: Social History   Tobacco Use  Smoking Status Never  Smokeless Tobacco Never    Labs: Review Flowsheet       Latest Ref Rng & Units 02/09/2020 04/25/2021 06/04/2022  Labs for ITP Cardiac and Pulmonary Rehab  Cholestrol 100 - 199 mg/dL 130  - -  LDL (calc) 0 - 99 mg/dL 58  - -  HDL-C >86 mg/dL 90  - -  Trlycerides 0 - 149 mg/dL 68  - -  PH, Arterial 5.78 - 7.45 - 7.360  7.382  -  PCO2 arterial 32 - 48 mmHg - 47.1  43.5  -  Bicarbonate 20.0 - 28.0 mmol/L - 26.4  26.6  25.8  -  TCO2 22 - 32 mmol/L - 28  28  27  28    O2 Saturation % - 80  82  98  -    Capillary Blood Glucose: No results found for: "GLUCAP"   Exercise Target Goals: Exercise Program Goal: Individual exercise prescription set using results from initial 6 min walk test and THRR while considering  patient's activity barriers and safety.   Exercise Prescription Goal: Initial exercise prescription builds to 30-45 minutes a day of aerobic activity, 2-3 days per week.  Home exercise guidelines will be given to patient during program as part of exercise prescription that the participant will acknowledge.  Activity Barriers & Risk Stratification:  Activity Barriers & Cardiac Risk Stratification - 06/24/22 1257       Activity Barriers & Cardiac Risk Stratification   Activity Barriers Joint Problems;History of Falls    Cardiac Risk Stratification High   <5 METs on            6 Minute Walk:  6 Minute Walk     Row Name 06/24/22 1256         6 Minute Walk   Phase Initial     Distance 1440 feet     Walk Time 6 minutes     # of Rest Breaks 0     MPH 2.72     METS 3.18     RPE 9     Perceived Dyspnea  0     VO2 Peak 11.14     Symptoms No     Resting HR 80 bpm     Resting BP 124/70     Resting Oxygen Saturation  99 %     Exercise Oxygen Saturation  during 6 min walk 98 %     Max Ex. HR 123 bpm     Max Ex. BP 132/78     2 Minute  Post BP 112/80              Oxygen Initial  Assessment:   Oxygen Re-Evaluation:   Oxygen Discharge (Final Oxygen Re-Evaluation):   Initial Exercise Prescription:  Initial Exercise Prescription - 06/24/22 1200       Date of Initial Exercise RX and Referring Provider   Date 06/24/22    Referring Provider Laurance Flatten, MD    Expected Discharge Date 09/06/22      Recumbant Bike   Level 1.5    RPM 50    Watts 30    Minutes 15    METs 2.5      Track   Laps 18    Minutes 15    METs 2.5      Prescription Details   Frequency (times per week) 3    Duration Progress to 30 minutes of continuous aerobic without signs/symptoms of physical distress      Intensity   THRR 40-80% of Max Heartrate 58-116    Ratings of Perceived Exertion 11-13    Perceived Dyspnea 0-4      Progression   Progression Continue progressive overload as per policy without signs/symptoms or physical distress.      Resistance Training   Training Prescription Yes    Weight 1    Reps 10-15             Perform Capillary Blood Glucose checks as needed.  Exercise Prescription Changes:   Exercise Prescription Changes     Row Name 07/01/22 1600 07/26/22 1400 07/31/22 1522 08/07/22 1500 08/16/22 1400     Response to Exercise   Blood Pressure (Admit) 124/78 122/80 106/68 104/64 112/64   Blood Pressure (Exercise) 142/78 128/66 142/70 146/76 148/80   Blood Pressure (Exit) 106/78 118/72 104/56 104/70 98/62   Heart Rate (Admit) 85 bpm 84 bpm 67 bpm 74 bpm 79 bpm   Heart Rate (Exercise) 121 bpm 110 bpm 112 bpm 112 bpm 119 bpm   Heart Rate (Exit) 94 bpm 87 bpm 76 bpm 83 bpm 88 bpm   Rating of Perceived Exertion (Exercise) 13 12 13 13 13    Symptoms None None None None None   Comments Pt's first day in the CRP2 program Reviewed METs Reviewed goals Reviewed Home exercise Rx Reviewed METs   Duration Continue with 30 min of aerobic exercise without signs/symptoms of physical distress. Continue with 30 min of aerobic exercise without signs/symptoms  of physical distress. Continue with 30 min of aerobic exercise without signs/symptoms of physical distress. Continue with 30 min of aerobic exercise without signs/symptoms of physical distress. Continue with 30 min of aerobic exercise without signs/symptoms of physical distress.   Intensity THRR unchanged THRR unchanged THRR unchanged THRR unchanged THRR unchanged     Progression   Progression Continue to progress workloads to maintain intensity without signs/symptoms of physical distress. Continue to progress workloads to maintain intensity without signs/symptoms of physical distress. Continue to progress workloads to maintain intensity without signs/symptoms of physical distress. Continue to progress workloads to maintain intensity without signs/symptoms of physical distress. Continue to progress workloads to maintain intensity without signs/symptoms of physical distress.   Average METs 2.9 3.2 3.4 3.5 3.3     Resistance Training   Training Prescription Yes Yes No No Yes   Weight 1 3 lbs No weights on wednesdays No weights on wednesdays 4 lbs   Reps 10-15 10-15 -- -- 10-15   Time 10 Minutes 10 Minutes -- -- 10 Minutes  Interval Training   Interval Training No No No No No     Recumbant Bike   Level -- -- 2 2 2    RPM -- -- 94 90 91   Watts -- -- 41 28 29   Minutes -- -- 15 15 15    METs -- -- 3.5 3.5 3     NuStep   Level 1 2 2 2 2    SPM 79 94 110 114 123   Minutes 15 15 15 15 15    METs 2.3 2.9 3.3 3.5 3.6     Track   Laps 19 19 -- -- --   Minutes 15 15 -- -- --   METs 3.43 3.43 -- -- --     Home Exercise Plan   Plans to continue exercise at -- -- -- Home (comment) Home (comment)   Frequency -- -- -- Add 4 additional days to program exercise sessions. Add 4 additional days to program exercise sessions.   Initial Home Exercises Provided -- -- -- 08/07/22 08/07/22    Row Name 08/21/22 1600             Response to Exercise   Blood Pressure (Admit) 120/64       Blood  Pressure (Exercise) 150/78       Blood Pressure (Exit) 100/62       Heart Rate (Admit) 81 bpm       Heart Rate (Exercise) 111 bpm       Heart Rate (Exit) 82 bpm       Rating of Perceived Exertion (Exercise) 12       Symptoms None       Comments Reviewed Goals       Duration Continue with 30 min of aerobic exercise without signs/symptoms of physical distress.       Intensity THRR unchanged         Progression   Progression Continue to progress workloads to maintain intensity without signs/symptoms of physical distress.       Average METs 3.5         Resistance Training   Training Prescription No       Weight No weights on Wednesdays         Interval Training   Interval Training No         Recumbant Bike   Level 2       RPM 95       Watts 42       Minutes 15       METs 3.5         NuStep   Level 2       SPM 120       Minutes 15       METs 3.5         Home Exercise Plan   Plans to continue exercise at Home (comment)       Frequency Add 4 additional days to program exercise sessions.       Initial Home Exercises Provided 08/07/22                Exercise Comments:   Exercise Comments     Row Name 07/01/22 1645 07/26/22 1437 07/31/22 1527 08/07/22 1500 08/16/22 1411   Exercise Comments Pt's first day in the CRP2 program. Pt tolerated session well with no complaints Reviewed METs. Pt is making progress on improving her MET level. Pt will continue to use Nsutep but would like to change from the track. Will  update her exercise Rx. Reviewed goals. Pt has changed from the track to the recumbent bike and voices that it is bettter for her hips. Pt increased to level 2 on hte bike today with toleration. Reviewed home exercise Rx. Pt does water aerobics, yoga, and a silver sneakers video on off days from the CRP2 program. Pt does aywherre form 45-60 minutes during these exercise sessions. Pt is at goal for 150+ minutes per week of exercise. Pt will continue her routine. Pt  verblaized understanding of the home exercise Rx. Reviewed METs. Pt is progressing. Will consider increases on both moldailites next session.            Exercise Goals and Review:   Exercise Goals     Row Name 06/24/22 0831             Exercise Goals   Increase Physical Activity Yes       Intervention Provide advice, education, support and counseling about physical activity/exercise needs.;Develop an individualized exercise prescription for aerobic and resistive training based on initial evaluation findings, risk stratification, comorbidities and participant's personal goals.       Expected Outcomes Short Term: Attend rehab on a regular basis to increase amount of physical activity.;Long Term: Exercising regularly at least 3-5 days a week.;Long Term: Add in home exercise to make exercise part of routine and to increase amount of physical activity.       Increase Strength and Stamina Yes       Intervention Provide advice, education, support and counseling about physical activity/exercise needs.;Develop an individualized exercise prescription for aerobic and resistive training based on initial evaluation findings, risk stratification, comorbidities and participant's personal goals.       Expected Outcomes Short Term: Perform resistance training exercises routinely during rehab and add in resistance training at home;Short Term: Increase workloads from initial exercise prescription for resistance, speed, and METs.;Long Term: Improve cardiorespiratory fitness, muscular endurance and strength as measured by increased METs and functional capacity ( )       Able to understand and use rate of perceived exertion (RPE) scale Yes       Intervention Provide education and explanation on how to use RPE scale       Expected Outcomes Short Term: Able to use RPE daily in rehab to express subjective intensity level;Long Term:  Able to use RPE to guide intensity level when exercising independently        Knowledge and understanding of Target Heart Rate Range (THRR) Yes       Intervention Provide education and explanation of THRR including how the numbers were predicted and where they are located for reference       Expected Outcomes Short Term: Able to state/look up THRR;Short Term: Able to use daily as guideline for intensity in rehab;Long Term: Able to use THRR to govern intensity when exercising independently       Understanding of Exercise Prescription Yes       Intervention Provide education, explanation, and written materials on patient's individual exercise prescription       Expected Outcomes Short Term: Able to explain program exercise prescription;Long Term: Able to explain home exercise prescription to exercise independently                Exercise Goals Re-Evaluation :  Exercise Goals Re-Evaluation     Row Name 07/01/22 1644 07/31/22 1526 08/21/22 1613         Exercise Goal Re-Evaluation   Exercise Goals Review Increase Physical Activity;Increase  Strength and Stamina;Able to understand and use rate of perceived exertion (RPE) scale;Knowledge and understanding of Target Heart Rate Range (THRR);Understanding of Exercise Prescription Increase Physical Activity;Increase Strength and Stamina;Able to understand and use rate of perceived exertion (RPE) scale;Knowledge and understanding of Target Heart Rate Range (THRR);Understanding of Exercise Prescription Increase Physical Activity;Increase Strength and Stamina;Able to understand and use rate of perceived exertion (RPE) scale;Knowledge and understanding of Target Heart Rate Range (THRR);Understanding of Exercise Prescription     Comments Pt's first day in the CRP2 program. Pt undersntands the exercise Rx, THRR, and RPE scale. Reviewed goals. Pt voices she is making progress with her diet and eating less salt. Peak METs are 3.5. Reviewed goals. Pt continues to make progress with her diet and eating less salt. Peak METs are 3.7. Pt is  exercising on her off days from the CRP2 program.     Expected Outcomes Will continue to monitor patient and progress exericse workloads as tolerated. Will continue to monitor patient and progress exericse workloads as tolerated. Will continue to monitor patient and progress exericse workloads as tolerated.              Discharge Exercise Prescription (Final Exercise Prescription Changes):  Exercise Prescription Changes - 08/21/22 1600       Response to Exercise   Blood Pressure (Admit) 120/64    Blood Pressure (Exercise) 150/78    Blood Pressure (Exit) 100/62    Heart Rate (Admit) 81 bpm    Heart Rate (Exercise) 111 bpm    Heart Rate (Exit) 82 bpm    Rating of Perceived Exertion (Exercise) 12    Symptoms None    Comments Reviewed Goals    Duration Continue with 30 min of aerobic exercise without signs/symptoms of physical distress.    Intensity THRR unchanged      Progression   Progression Continue to progress workloads to maintain intensity without signs/symptoms of physical distress.    Average METs 3.5      Resistance Training   Training Prescription No    Weight No weights on Wednesdays      Interval Training   Interval Training No      Recumbant Bike   Level 2    RPM 95    Watts 42    Minutes 15    METs 3.5      NuStep   Level 2    SPM 120    Minutes 15    METs 3.5      Home Exercise Plan   Plans to continue exercise at Home (comment)    Frequency Add 4 additional days to program exercise sessions.    Initial Home Exercises Provided 08/07/22             Nutrition:  Target Goals: Understanding of nutrition guidelines, daily intake of sodium 1500mg , cholesterol 200mg , calories 30% from fat and 7% or less from saturated fats, daily to have 5 or more servings of fruits and vegetables.  Biometrics:  Pre Biometrics - 06/24/22 0807       Pre Biometrics   Waist Circumference 36.25 inches    Hip Circumference 40.5 inches    Waist to Hip Ratio 0.9  %    Triceps Skinfold 28 mm    % Body Fat 38.6 %    Grip Strength 14 kg    Flexibility 14.75 in    Single Leg Stand 16.25 seconds              Nutrition Therapy  Plan and Nutrition Goals:  Nutrition Therapy & Goals - 08/26/22 1039       Nutrition Therapy   Diet Heart Healthy Diet    Drug/Food Interactions Statins/Certain Fruits      Personal Nutrition Goals   Nutrition Goal Patient to identify strategies for reducing cardiovascular risk by attending the [facility] education and nutrition series weekly.    Personal Goal #2 Patient to improve diet quality by using the plate method as a guide for meal planning to include lean protein/plant protein, fruits, vegetables, whole grains, nonfat dairy as part of a well-balanced diet.    Personal Goal #3 Patient to reduce sodium intake to 1500mg  per day    Comments Goals in action. Solimar continues to attend the [facility] education and nutrition series regularly. She is down 3.3# since starting with our program. 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 Patient will benefit from participation in intensive cardiac rehab for nutrition, exercise, and lifestyle modification.      Intervention Plan   Intervention Prescribe, educate and counsel regarding individualized specific dietary modifications aiming towards targeted core components such as weight, hypertension, lipid management, diabetes, heart failure and other comorbidities.;Nutrition handout(s) given to patient.    Expected Outcomes Short Term Goal: Understand basic principles of dietary content, such as calories, fat, sodium, cholesterol and nutrients.;Long Term Goal: Adherence to prescribed nutrition plan.             Nutrition Assessments:  Nutrition Assessments - 07/01/22 1418       Rate Your Plate Scores   Pre Score 72            MEDIFICTS Score Key: ?70 Need to make dietary changes  40-70 Heart Healthy Diet ? 40  Therapeutic Level Cholesterol Diet   Flowsheet Row INTENSIVE CARDIAC REHAB from 07/01/2022 in [facility]  Picture Your Plate Total Score on Admission 72      Picture Your Plate Scores: <16 Unhealthy dietary pattern with much room for improvement. 41-50 Dietary pattern unlikely to meet recommendations for good health and room for improvement. 51-60 More healthful dietary pattern, with some room for improvement.  >60 Healthy dietary pattern, although there may be some specific behaviors that could be improved.    Nutrition Goals Re-Evaluation:  Nutrition Goals Re-Evaluation     Row Name 07/01/22 1412 07/30/22 0921 08/26/22 1039         Goals   Current Weight 146 lb 13.2 oz (66.6 kg) 141 lb 15.6 oz (64.4 kg) 142 lb 3.2 oz (64.5 kg)     Comment lipids WNL lipids WNL, CBC WNL lipids WNL, CBC WNL     Expected Outcome Detra reports motivation to reduce sodium in her cooking. Answered questions regarding reading labels for sodium. Patient will benefit from participation in intensive cardiac rehab for nutrition, exercise, and lifestyle modification. Goals in action. Jamielee continues to attend the [facility] education and nutrition series regularly. She is down 3.5# since starting with our program. Patient will benefit from participation in intensive cardiac rehab for nutrition, exercise, and lifestyle modification. Goals in action. Mazy continues to attend the [facility] education and nutrition series regularly. She is down 3.3# since starting with our program. 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 Patient will benefit from participation in intensive cardiac rehab for nutrition, exercise, and lifestyle modification.  Nutrition Goals Re-Evaluation:  Nutrition Goals Re-Evaluation     Row Name 07/01/22 1412 07/30/22 0921 08/26/22 1039         Goals   Current Weight  146 lb 13.2 oz (66.6 kg) 141 lb 15.6 oz (64.4 kg) 142 lb 3.2 oz (64.5 kg)     Comment lipids WNL lipids WNL, CBC WNL lipids WNL, CBC WNL     Expected Outcome Psalms reports motivation to reduce sodium in her cooking. Answered questions regarding reading labels for sodium. Patient will benefit from participation in intensive cardiac rehab for nutrition, exercise, and lifestyle modification. Goals in action. Majesty continues to attend the [facility] education and nutrition series regularly. She is down 3.5# since starting with our program. Patient will benefit from participation in intensive cardiac rehab for nutrition, exercise, and lifestyle modification. Goals in action. Hollace continues to attend the [facility] education and nutrition series regularly. She is down 3.3# since starting with our program. 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 Patient will benefit from participation in intensive cardiac rehab for nutrition, exercise, and lifestyle modification.              Nutrition Goals Discharge (Final Nutrition Goals Re-Evaluation):  Nutrition Goals Re-Evaluation - 08/26/22 1039       Goals   Current Weight 142 lb 3.2 oz (64.5 kg)    Comment lipids WNL, CBC WNL    Expected Outcome Goals in action. Mataya continues to attend the [facility] education and nutrition series regularly. She is down 3.3# since starting with our program. 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 Patient will benefit from participation in intensive cardiac rehab for nutrition, exercise, and lifestyle modification.             Psychosocial: Target Goals: Acknowledge presence or absence of significant depression and/or stress, maximize coping skills, provide positive support system. Participant is able to verbalize types and ability to use techniques and skills needed for reducing stress and depression.  Initial  Review & Psychosocial Screening:  Initial Psych Review & Screening - 06/24/22 1310       Initial Review   Current issues with None Identified      Family Dynamics   Good Support System? Yes   Exilda has her roommate for support     Barriers   Psychosocial barriers to participate in program There are no identifiable barriers or psychosocial needs.      Screening Interventions   Interventions Encouraged to exercise;Provide feedback about the scores to participant    Expected Outcomes Short Term goal: Identification and review with participant of any Quality of Life or Depression concerns found by scoring the questionnaire.;Long Term goal: The participant improves quality of Life and PHQ9 Scores as seen by post scores and/or verbalization of changes             Quality of Life Scores:  Quality of Life - 06/24/22 1310       Quality of Life   Select Quality of Life      Quality of Life Scores   Health/Function Pre 25.8 %    Socioeconomic Pre 27.25 %    Psych/Spiritual Pre 25.79 %    Family Pre 26 %    GLOBAL Pre 26.17 %            Scores of 19 and below usually indicate a poorer quality of life in these areas.  A difference of  2-3 points is a clinically meaningful difference.  A difference of 2-3 points in the total score of the Quality of Life Index has been associated with significant improvement in overall quality of life, self-image, physical symptoms, and general health in studies assessing change in quality of life.  PHQ-9: Review Flowsheet       06/24/2022  Depression screen PHQ 2/9  Decreased Interest 0  Down, Depressed, Hopeless 0  PHQ - 2 Score 0  Altered sleeping 1  Tired, decreased energy 0  Change in appetite 0  Feeling bad or failure about yourself  0  Trouble concentrating 0  Moving slowly or fidgety/restless 0  Suicidal thoughts 0  PHQ-9 Score 1  Difficult doing work/chores Not difficult at all   Interpretation of Total Score  Total Score  Depression Severity:  1-4 = Minimal depression, 5-9 = Mild depression, 10-14 = Moderate depression, 15-19 = Moderately severe depression, 20-27 = Severe depression   Psychosocial Evaluation and Intervention:   Psychosocial Re-Evaluation:  Psychosocial Re-Evaluation     Row Name 07/02/22 1730 07/30/22 1522 08/28/22 0940         Psychosocial Re-Evaluation   Current issues with None Identified None Identified None Identified     Interventions Encouraged to attend Cardiac Rehabilitation for the exercise Encouraged to attend Cardiac Rehabilitation for the exercise Encouraged to attend Cardiac Rehabilitation for the exercise     Continue Psychosocial Services  No Follow up required No Follow up required No Follow up required              Psychosocial Discharge (Final Psychosocial Re-Evaluation):  Psychosocial Re-Evaluation - 08/28/22 0940       Psychosocial Re-Evaluation   Current issues with None Identified    Interventions Encouraged to attend Cardiac Rehabilitation for the exercise    Continue Psychosocial Services  No Follow up required             Vocational Rehabilitation: Provide vocational rehab assistance to qualifying candidates.   Vocational Rehab Evaluation & Intervention:  Vocational Rehab - 06/24/22 0831       Initial Vocational Rehab Evaluation & Intervention   Assessment shows need for Vocational Rehabilitation No   Karletta works part time and is retired full time            Education: Education Goals: Education classes will be provided on a weekly basis, covering required topics. Participant will state understanding/return demonstration of topics presented.    Education     Row Name 07/01/22 1700     Education   Cardiac Education Topics Pritikin   Select Workshops     Workshops   Educator Exercise Physiologist   Select Psychosocial   Psychosocial Workshop Healthy Sleep for a Healthy Heart   Instruction Review Code 1- Verbalizes  Understanding   Class Start Time 1405   Class Stop Time 1502   Class Time Calculation (min) 57 min    Row Name 07/05/22 1400     Education   Cardiac Education Topics Pritikin   Psychologist, forensic Exercise Education   Exercise Education Improving Performance   Instruction Review Code 1- Verbalizes Understanding   Class Start Time 1400   Class Stop Time 1432   Class Time Calculation (min) 32 min    Row Name 07/08/22 1600     Education   Cardiac Education Topics Pritikin   [employer]  Educator Dietitian   Select Nutrition   Instruction Review Code 1- Verbalizes Understanding   Class Start Time 1400   Class Stop Time 1453   Class Time Calculation (min) 53 min    Row Name 07/10/22 1500     Education   Cardiac Education Topics Pritikin   Customer service manager   Weekly Topic Simple Sides and Sauces   Instruction Review Code 1- Verbalizes Understanding   Class Start Time 1350   Class Stop Time 1425   Class Time Calculation (min) 35 min    Row Name 07/12/22 1600     Education   Cardiac Education Topics Pritikin   Nurse, children's Exercise Physiologist   Select Psychosocial   Psychosocial How Our Thoughts Can Heal Our Hearts   Instruction Review Code 1- Verbalizes Understanding   Class Start Time 1400   Class Stop Time 1445   Class Time Calculation (min) 45 min    Row Name 07/15/22 1600     Education   Cardiac Education Topics Pritikin   Geographical information systems officer Psychosocial   Psychosocial Workshop From Head to Heart: The Power of a Healthy Outlook   Instruction Review Code 1- Verbalizes Understanding   Class Start Time 1405   Class Stop Time 1503   Class Time Calculation (min) 58 min    Row Name 07/17/22 1500     Education   Cardiac Education Topics  Pritikin   Customer service manager   Weekly Topic Powerhouse Plant-Based Proteins   Instruction Review Code 1- Verbalizes Understanding   Class Start Time 1355   Class Stop Time 1436   Class Time Calculation (min) 41 min    Row Name 07/19/22 1600     Education   Cardiac Education Topics Pritikin   Hospital doctor Education   General Education Hypertension and Heart Disease   Instruction Review Code 1- Verbalizes Understanding   Class Start Time 1400   Class Stop Time 1442   Class Time Calculation (min) 42 min    Row Name 07/26/22 1600     Education   Cardiac Education Topics Pritikin   Engineer, mining Education   General Education Heart Disease Risk Reduction   Instruction Review Code 1- Verbalizes Understanding   Class Start Time 1405   Class Stop Time 1450   Class Time Calculation (min) 45 min    Row Name 07/29/22 1600     Education   Cardiac Education Topics Pritikin   Select Workshops     Workshops   Educator Exercise Physiologist   Select Exercise   Exercise Workshop Location manager and Fall Prevention   Instruction Review Code 1- Verbalizes Understanding   Class Start Time 1405   Class Stop Time 1445   Class Time Calculation (min) 40 min    Row Name 07/31/22 1500     Education   Cardiac Education Topics Pritikin   Customer service manager   Weekly Topic Fast and Healthy Breakfasts   Instruction Review Code 1- Verbalizes Understanding   Class  Start Time 1400   Class Stop Time 1440   Class Time Calculation (min) 40 min    Row Name 08/02/22 1500     Education   Cardiac Education Topics Pritikin   Select Core Videos     Core Videos   Educator Dietitian   Select Nutrition   Nutrition Overview of the Pritikin Eating Plan   Instruction Review  Code 1- Verbalizes Understanding   Class Start Time 1400   Class Stop Time 1450   Class Time Calculation (min) 50 min    Row Name 08/05/22 1600     Education   Cardiac Education Topics Pritikin   Select Workshops     Workshops   Educator Exercise Physiologist   Select Psychosocial   Psychosocial Workshop Recognizing and Reducing Stress   Instruction Review Code 1- Verbalizes Understanding   Class Start Time 1359   Class Stop Time 1445   Class Time Calculation (min) 46 min    Row Name 08/09/22 1500     Education   Cardiac Education Topics Pritikin   Nurse, children's Exercise Physiologist   Select Psychosocial   Psychosocial Healthy Minds, Bodies, Hearts   Instruction Review Code 1- Verbalizes Understanding   Class Start Time 1357   Class Stop Time 1429   Class Time Calculation (min) 32 min    Row Name 08/12/22 1500     Education   Cardiac Education Topics Pritikin   Glass blower/designer Nutrition   Nutrition Workshop Label Reading   Instruction Review Code 1- Verbalizes Understanding   Class Start Time 1400   Class Stop Time 1445   Class Time Calculation (min) 45 min    Row Name 08/16/22 1500     Education   Cardiac Education Topics Pritikin   Licensed conveyancer Nutrition   Nutrition Other  label reading   Instruction Review Code 1- Verbalizes Understanding   Class Start Time 1400   Class Stop Time 1440   Class Time Calculation (min) 40 min    Row Name 08/19/22 1500     Education   Cardiac Education Topics Pritikin   Immunologist Exercise Physiologist   Select Exercise   Exercise Workshop Exercise Basics: Building Your Action Plan   Instruction Review Code 1- Verbalizes Understanding   Class Start Time 1404   Class Stop Time 1448   Class Time Calculation (min) 44 min    Row Name 08/21/22 1500      Education   Cardiac Education Topics Pritikin   Customer service manager   Weekly Topic Tasty Appetizers and Snacks   Instruction Review Code 1- Verbalizes Understanding   Class Start Time 1400   Class Stop Time 1440   Class Time Calculation (min) 40 min    Row Name 08/23/22 1500     Education   Cardiac Education Topics Pritikin   Nurse, children's Exercise Physiologist   Select Nutrition   Nutrition Nutrition Action Plan   Instruction Review Code 1- Verbalizes Understanding   Class Start Time 1400   Class Stop Time 1440   Class Time Calculation (min) 40 min    Row Name 08/26/22  1600     Education   Cardiac Education Topics Pritikin   Licensed conveyancer Nutrition   Nutrition Calorie Density   Instruction Review Code 1- Verbalizes Understanding   Class Start Time 1400   Class Stop Time 1445   Class Time Calculation (min) 45 min            Core Videos: Exercise    Move It!  Clinical staff conducted group or individual video education with verbal and written material and guidebook.  Patient learns the recommended Pritikin exercise program. Exercise with the goal of living a long, healthy life. Some of the health benefits of exercise include controlled diabetes, healthier blood pressure levels, improved cholesterol levels, improved heart and lung capacity, improved sleep, and better body composition. Everyone should speak with their doctor before starting or changing an exercise routine.  Biomechanical Limitations Clinical staff conducted group or individual video education with verbal and written material and guidebook.  Patient learns how biomechanical limitations can impact exercise and how we can mitigate and possibly overcome limitations to have an impactful and balanced exercise routine.  Body Composition Clinical staff conducted group or  individual video education with verbal and written material and guidebook.  Patient learns that body composition (ratio of muscle mass to fat mass) is a key component to assessing overall fitness, rather than body weight alone. Increased fat mass, especially visceral belly fat, can put [place] at increased risk for metabolic syndrome, type 2 diabetes, heart disease, and even death. It is recommended to combine diet and exercise (cardiovascular and resistance training) to improve your body composition. Seek guidance from your physician and exercise physiologist before implementing an exercise routine.  Exercise Action Plan Clinical staff conducted group or individual video education with verbal and written material and guidebook.  Patient learns the recommended strategies to achieve and enjoy long-term exercise adherence, including variety, self-motivation, self-efficacy, and positive decision making. Benefits of exercise include fitness, good health, weight management, more energy, better sleep, less stress, and overall well-being.  Medical   Heart Disease Risk Reduction Clinical staff conducted group or individual video education with verbal and written material and guidebook.  Patient learns our heart is our most vital organ as it circulates oxygen, nutrients, white blood cells, and hormones throughout the entire body, and carries waste away. Data supports a plant-based eating plan like the Pritikin Program for its effectiveness in slowing progression of and reversing heart disease. The video provides a number of recommendations to address heart disease.   Metabolic Syndrome and Belly Fat  Clinical staff conducted group or individual video education with verbal and written material and guidebook.  Patient learns what metabolic syndrome is, how it leads to heart disease, and how one can reverse it and keep it from coming back. You have metabolic syndrome if you have 3 of the following 5 criteria: abdominal  obesity, high blood pressure, high triglycerides, low HDL cholesterol, and high blood sugar.  Hypertension and Heart Disease Clinical staff conducted group or individual video education with verbal and written material and guidebook.  Patient learns that high blood pressure, or hypertension, is very common in the [place]. Hypertension is largely due to excessive salt intake, but other important risk factors include being overweight, physical inactivity, drinking too much alcohol, smoking, and not eating enough potassium from fruits and vegetables. High blood pressure is a leading risk factor for heart attack, stroke, congestive heart  failure, dementia, kidney failure, and premature death. Long-term effects of excessive salt intake include stiffening of the arteries and thickening of heart muscle and organ damage. Recommendations include ways to reduce hypertension and the risk of heart disease.  Diseases of Our Time - Focusing on Diabetes Clinical staff conducted group or individual video education with verbal and written material and guidebook.  Patient learns why the best way to stop diseases of our time is prevention, through food and other lifestyle changes. Medicine (such as prescription pills and surgeries) is often only a Band-Aid on the problem, not a long-term solution. Most common diseases of our time include obesity, type 2 diabetes, hypertension, heart disease, and cancer. The Pritikin Program is recommended and has been proven to help reduce, reverse, and/or prevent the damaging effects of metabolic syndrome.  Nutrition   Overview of the Pritikin Eating Plan  Clinical staff conducted group or individual video education with verbal and written material and guidebook.  Patient learns about the Pritikin Eating Plan for disease risk reduction. The Pritikin Eating Plan emphasizes a wide variety of unrefined, minimally-processed carbohydrates, like fruits, vegetables, whole grains, and  legumes. Go, Caution, and Stop food choices are explained. Plant-based and lean animal proteins are emphasized. Rationale provided for low sodium intake for blood pressure control, low added sugars for blood sugar stabilization, and low added fats and oils for coronary artery disease risk reduction and weight management.  Calorie Density  Clinical staff conducted group or individual video education with verbal and written material and guidebook.  Patient learns about calorie density and how it impacts the Pritikin Eating Plan. Knowing the characteristics of the food you choose will help you decide whether those foods will lead to weight gain or weight loss, and whether you want to consume more or less of them. Weight loss is usually a side effect of the Pritikin Eating Plan because of its focus on low calorie-dense foods.  Label Reading  Clinical staff conducted group or individual video education with verbal and written material and guidebook.  Patient learns about the Pritikin recommended label reading guidelines and corresponding recommendations regarding calorie density, added sugars, sodium content, and whole grains.  Dining Out - Part 1  Clinical staff conducted group or individual video education with verbal and written material and guidebook.  Patient learns that restaurant meals can be sabotaging because they can be so high in calories, fat, sodium, and/or sugar. Patient learns recommended strategies on how to positively address this and avoid unhealthy pitfalls.  Facts on Fats  Clinical staff conducted group or individual video education with verbal and written material and guidebook.  Patient learns that lifestyle modifications can be just as effective, if not more so, as many medications for lowering your risk of heart disease. A Pritikin lifestyle can help to reduce your risk of inflammation and atherosclerosis (cholesterol build-up, or plaque, in the artery walls). Lifestyle  interventions such as dietary choices and physical activity address the cause of atherosclerosis. A review of the types of fats and their impact on blood cholesterol levels, along with dietary recommendations to reduce fat intake is also included.  Nutrition Action Plan  Clinical staff conducted group or individual video education with verbal and written material and guidebook.  Patient learns how to incorporate Pritikin recommendations into their lifestyle. Recommendations include planning and keeping personal health goals in mind as an important part of their success.  Healthy Mind-Set    Healthy Minds, Bodies, Hearts  Clinical staff conducted group or  individual video education with verbal and written material and guidebook.  Patient learns how to identify when they are stressed. Video will discuss the impact of that stress, as well as the many benefits of stress management. Patient will also be introduced to stress management techniques. The way we think, act, and feel has an impact on our hearts.  How Our Thoughts Can Heal Our Hearts  Clinical staff conducted group or individual video education with verbal and written material and guidebook.  Patient learns that negative thoughts can cause depression and anxiety. This can result in negative lifestyle behavior and serious health problems. Cognitive behavioral therapy is an effective method to help control our thoughts in order to change and improve our emotional outlook.  Additional Videos:  Exercise    Improving Performance  Clinical staff conducted group or individual video education with verbal and written material and guidebook.  Patient learns to use a non-linear approach by alternating intensity levels and lengths of time spent exercising to help burn more calories and lose more body fat. Cardiovascular exercise helps improve heart health, metabolism, hormonal balance, blood sugar control, and recovery from fatigue. Resistance training  improves strength, endurance, balance, coordination, reaction time, metabolism, and muscle mass. Flexibility exercise improves circulation, posture, and balance. Seek guidance from your physician and exercise physiologist before implementing an exercise routine and learn your capabilities and proper form for all exercise.  Introduction to Yoga  Clinical staff conducted group or individual video education with verbal and written material and guidebook.  Patient learns about yoga, a discipline of the coming together of mind, breath, and body. The benefits of yoga include improved flexibility, improved range of motion, better posture and core strength, increased lung function, weight loss, and positive self-image. Yoga's heart health benefits include lowered blood pressure, healthier heart rate, decreased cholesterol and triglyceride levels, improved immune function, and reduced stress. Seek guidance from your physician and exercise physiologist before implementing an exercise routine and learn your capabilities and proper form for all exercise.  Medical   Aging: Enhancing Your Quality of Life  Clinical staff conducted group or individual video education with verbal and written material and guidebook.  Patient learns key strategies and recommendations to stay in good physical health and enhance quality of life, such as prevention strategies, having an advocate, securing a Health Care Proxy and Power of Attorney, and keeping a list of medications and system for tracking them. It also discusses how to avoid risk for bone loss.  Biology of Weight Control  Clinical staff conducted group or individual video education with verbal and written material and guidebook.  Patient learns that weight gain occurs because we consume more calories than we burn (eating more, moving less). Even if your body weight is normal, you may have higher ratios of fat compared to muscle mass. Too much body fat puts you at increased  risk for cardiovascular disease, heart attack, stroke, type 2 diabetes, and obesity-related cancers. In addition to exercise, following the Pritikin Eating Plan can help reduce your risk.  Decoding Lab Results  Clinical staff conducted group or individual video education with verbal and written material and guidebook.  Patient learns that lab test reflects one measurement whose values change over time and are influenced by many factors, including medication, stress, sleep, exercise, food, hydration, pre-existing medical conditions, and more. It is recommended to use the knowledge from this video to become more involved with your lab results and evaluate your numbers to speak with your doctor.  Diseases of Our Time - Overview  Clinical staff conducted group or individual video education with verbal and written material and guidebook.  Patient learns that according to the CDC, 50% to 70% of chronic diseases (such as obesity, type 2 diabetes, elevated lipids, hypertension, and heart disease) are avoidable through lifestyle improvements including healthier food choices, listening to satiety cues, and increased physical activity.  Sleep Disorders Clinical staff conducted group or individual video education with verbal and written material and guidebook.  Patient learns how good quality and duration of sleep are important to overall health and well-being. Patient also learns about sleep disorders and how they impact health along with recommendations to address them, including discussing with a physician.  Nutrition  Dining Out - Part 2 Clinical staff conducted group or individual video education with verbal and written material and guidebook.  Patient learns how to plan ahead and communicate in order to maximize their dining experience in a healthy and nutritious manner. Included are recommended food choices based on the type of restaurant the patient is visiting.   Fueling a Tax inspector conducted group or individual video education with verbal and written material and guidebook.  There is a strong connection between our food choices and our health. Diseases like obesity and type 2 diabetes are very prevalent and are in large-part due to lifestyle choices. The Pritikin Eating Plan provides plenty of food and hunger-curbing satisfaction. It is easy to follow, affordable, and helps reduce health risks.  Menu Workshop  Clinical staff conducted group or individual video education with verbal and written material and guidebook.  Patient learns that restaurant meals can sabotage health goals because they are often packed with calories, fat, sodium, and sugar. Recommendations include strategies to plan ahead and to communicate with the manager, chef, or server to help order a healthier meal.  Planning Your Eating Strategy  Clinical staff conducted group or individual video education with verbal and written material and guidebook.  Patient learns about the Pritikin Eating Plan and its benefit of reducing the risk of disease. The Pritikin Eating Plan does not focus on calories. Instead, it emphasizes high-quality, nutrient-rich foods. By knowing the characteristics of the foods, we choose, we can determine their calorie density and make informed decisions.  Targeting Your Nutrition Priorities  Clinical staff conducted group or individual video education with verbal and written material and guidebook.  Patient learns that lifestyle habits have a tremendous impact on disease risk and progression. This video provides eating and physical activity recommendations based on your personal health goals, such as reducing LDL cholesterol, losing weight, preventing or controlling type 2 diabetes, and reducing high blood pressure.  Vitamins and Minerals  Clinical staff conducted group or individual video education with verbal and written material and guidebook.  Patient learns different ways to  obtain key vitamins and minerals, including through a recommended healthy diet. It is important to discuss all supplements you take with your doctor.   Healthy Mind-Set    Smoking Cessation  Clinical staff conducted group or individual video education with verbal and written material and guidebook.  Patient learns that cigarette smoking and tobacco addiction pose a serious health risk which affects millions of people. Stopping smoking will significantly reduce the risk of heart disease, lung disease, and many forms of cancer. Recommended strategies for quitting are covered, including working with your doctor to develop a successful plan.  Culinary   Becoming a Set designer conducted group or  individual video education with verbal and written material and guidebook.  Patient learns that cooking at home can be healthy, cost-effective, quick, and puts them in control. Keys to cooking healthy recipes will include looking at your recipe, assessing your equipment needs, planning ahead, making it simple, choosing cost-effective seasonal ingredients, and limiting the use of added fats, salts, and sugars.  Cooking - Breakfast and Snacks  Clinical staff conducted group or individual video education with verbal and written material and guidebook.  Patient learns how important breakfast is to satiety and nutrition through the entire day. Recommendations include key foods to eat during breakfast to help stabilize blood sugar levels and to prevent overeating at meals later in the day. Planning ahead is also a key component.  Cooking - Educational psychologist conducted group or individual video education with verbal and written material and guidebook.  Patient learns eating strategies to improve overall health, including an approach to cook more at home. Recommendations include thinking of animal protein as a side on your plate rather than center stage and focusing instead on lower  calorie dense options like vegetables, fruits, whole grains, and plant-based proteins, such as beans. Making sauces in large quantities to freeze for later and leaving the skin on your vegetables are also recommended to maximize your experience.  Cooking - Healthy Salads and Dressing Clinical staff conducted group or individual video education with verbal and written material and guidebook.  Patient learns that vegetables, fruits, whole grains, and legumes are the foundations of the Pritikin Eating Plan. Recommendations include how to incorporate each of these in flavorful and healthy salads, and how to create homemade salad dressings. Proper handling of ingredients is also covered. Cooking - Soups and [employer] - Soups and Desserts Clinical staff conducted group or individual video education with verbal and written material and guidebook.  Patient learns that Pritikin soups and desserts make for easy, nutritious, and delicious snacks and meal components that are low in sodium, fat, sugar, and calorie density, while high in vitamins, minerals, and filling fiber. Recommendations include simple and healthy ideas for soups and desserts.   Overview     The Pritikin Solution Program Overview Clinical staff conducted group or individual video education with verbal and written material and guidebook.  Patient learns that the results of the Pritikin Program have been documented in more than 100 articles published in peer-reviewed journals, and the benefits include reducing risk factors for (and, in some cases, even reversing) high cholesterol, high blood pressure, type 2 diabetes, obesity, and more! An overview of the three key pillars of the Pritikin Program will be covered: eating well, doing regular exercise, and having a healthy mind-set.  WORKSHOPS  Exercise: Exercise Basics: Building Your Action Plan Clinical staff led group instruction and group discussion with PowerPoint presentation and  patient guidebook. To enhance the learning environment the use of posters, models and videos may be added. At the conclusion of this workshop, patients will comprehend the difference between physical activity and exercise, as well as the benefits of incorporating both, into their routine. Patients will understand the FITT (Frequency, Intensity, Time, and Type) principle and how to use it to build an exercise action plan. In addition, safety concerns and other considerations for exercise and cardiac rehab will be addressed by the presenter. The purpose of this lesson is to promote a comprehensive and effective weekly exercise routine in order to improve patients' overall level of fitness.   Managing Heart Disease:  Your Path to a Healthier Heart Clinical staff led group instruction and group discussion with PowerPoint presentation and patient guidebook. To enhance the learning environment the use of posters, models and videos may be added.At the conclusion of this workshop, patients will understand the anatomy and physiology of the heart. Additionally, they will understand how Pritikin's three pillars impact the risk factors, the progression, and the management of heart disease.  The purpose of this lesson is to provide a high-level overview of the heart, heart disease, and how the Pritikin lifestyle positively impacts risk factors.  Exercise Biomechanics Clinical staff led group instruction and group discussion with PowerPoint presentation and patient guidebook. To enhance the learning environment the use of posters, models and videos may be added. Patients will learn how the structural parts of their bodies function and how these functions impact their daily activities, movement, and exercise. Patients will learn how to promote a neutral spine, learn how to manage pain, and identify ways to improve their physical movement in order to promote healthy living. The purpose of this lesson is to expose  patients to common physical limitations that impact physical activity. Participants will learn practical ways to adapt and manage aches and pains, and to minimize their effect on regular exercise. Patients will learn how to maintain good posture while sitting, walking, and lifting.  Balance Training and Fall Prevention  Clinical staff led group instruction and group discussion with PowerPoint presentation and patient guidebook. To enhance the learning environment the use of posters, models and videos may be added. At the conclusion of this workshop, patients will understand the importance of their sensorimotor skills (vision, proprioception, and the vestibular system) in maintaining their ability to balance as they age. Patients will apply a variety of balancing exercises that are appropriate for their current level of function. Patients will understand the common causes for poor balance, possible solutions to these problems, and ways to modify their physical environment in order to minimize their fall risk. The purpose of this lesson is to teach patients about the importance of maintaining balance as they age and ways to minimize their risk of falling.  WORKSHOPS   Nutrition:  Fueling a Ship broker led group instruction and group discussion with PowerPoint presentation and patient guidebook. To enhance the learning environment the use of posters, models and videos may be added. Patients will review the foundational principles of the Pritikin Eating Plan and understand what constitutes a serving size in each of the food groups. Patients will also learn Pritikin-friendly foods that are better choices when away from home and review make-ahead meal and snack options. Calorie density will be reviewed and applied to three nutrition priorities: weight maintenance, weight loss, and weight gain. The purpose of this lesson is to reinforce (in a group setting) the key concepts around what  patients are recommended to eat and how to apply these guidelines when away from home by planning and selecting Pritikin-friendly options. Patients will understand how calorie density may be adjusted for different weight management goals.  Mindful Eating  Clinical staff led group instruction and group discussion with PowerPoint presentation and patient guidebook. To enhance the learning environment the use of posters, models and videos may be added. Patients will briefly review the concepts of the Pritikin Eating Plan and the importance of low-calorie dense foods. The concept of mindful eating will be introduced as well as the importance of paying attention to internal hunger signals. Triggers for non-hunger eating and techniques for dealing  with triggers will be explored. The purpose of this lesson is to provide patients with the opportunity to review the basic principles of the Pritikin Eating Plan, discuss the value of eating mindfully and how to measure internal cues of hunger and fullness using the Hunger Scale. Patients will also discuss reasons for non-hunger eating and learn strategies to use for controlling emotional eating.  Targeting Your Nutrition Priorities Clinical staff led group instruction and group discussion with PowerPoint presentation and patient guidebook. To enhance the learning environment the use of posters, models and videos may be added. Patients will learn how to determine their genetic susceptibility to disease by reviewing their family history. Patients will gain insight into the importance of diet as part of an overall healthy lifestyle in mitigating the impact of genetics and other environmental insults. The purpose of this lesson is to provide patients with the opportunity to assess their personal nutrition priorities by looking at their family history, their own health history and current risk factors. Patients will also be able to discuss ways of prioritizing and modifying  the Pritikin Eating Plan for their highest risk areas  Menu  Clinical staff led group instruction and group discussion with PowerPoint presentation and patient guidebook. To enhance the learning environment the use of posters, models and videos may be added. Using menus brought in from [employer], or printed from [employer], patients will apply the Pritikin dining out guidelines that were presented in the [employer] video. Patients will also be able to practice these guidelines in a variety of provided scenarios. The purpose of this lesson is to provide patients with the opportunity to practice hands-on learning of the Pritikin Dining Out guidelines with actual menus and practice scenarios.  Label Reading Clinical staff led group instruction and group discussion with PowerPoint presentation and patient guidebook. To enhance the learning environment the use of posters, models and videos may be added. Patients will review and discuss the Pritikin label reading guidelines presented in Pritikin's Label Reading Educational series video. Using fool labels brought in from local grocery stores and markets, patients will apply the label reading guidelines and determine if the packaged food meet the Pritikin guidelines. The purpose of this lesson is to provide patients with the opportunity to review, discuss, and practice hands-on learning of the Pritikin Label Reading guidelines with actual packaged food labels. Cooking School  Pritikin's [employer] are designed to teach patients ways to prepare quick, simple, and affordable recipes at home. The importance of nutrition's role in chronic disease risk reduction is reflected in its emphasis in the overall Pritikin program. By learning how to prepare essential core Pritikin Eating Plan recipes, patients will increase control over what they eat; be able to customize the flavor of foods without the use of added salt, sugar, or  fat; and improve the quality of the food they consume. By learning a set of core recipes which are easily assembled, quickly prepared, and affordable, patients are more likely to prepare more healthy foods at home. These workshops focus on convenient breakfasts, simple entres, side dishes, and desserts which can be prepared with minimal effort and are consistent with nutrition recommendations for cardiovascular risk reduction. Cooking [employer] are taught by a Armed forces logistics/support/administrative officer (RD) who has been trained by the [employer]. The chef or RD has a clear understanding of the importance of minimizing - if not completely eliminating - added fat, sugar, and sodium in recipes. Throughout the  series of Cooking School Workshop sessions, patients will learn about healthy ingredients and efficient methods of cooking to build confidence in their capability to prepare    Cooking School weekly topics:  Adding Flavor- Sodium-Free  Fast and Healthy Breakfasts  Powerhouse Plant-Based Proteins  Satisfying Salads and Dressings  Simple Sides and Sauces  International Cuisine-Spotlight on the [employer] Zones  Delicious Desserts  Savory Soups  [employer] - Meals in a Astronomer Appetizers and Snacks  Comforting Weekend Breakfasts  One-Pot Wonders   Fast Evening Meals  Landscape architect Your Pritikin Plate  WORKSHOPS   Healthy Mindset (Psychosocial):  Focused Goals, Sustainable Changes Clinical staff led group instruction and group discussion with PowerPoint presentation and patient guidebook. To enhance the learning environment the use of posters, models and videos may be added. Patients will be able to apply effective goal setting strategies to establish at least one personal goal, and then take consistent, meaningful action toward that goal. They will learn to identify common barriers to achieving personal goals and develop strategies to overcome them. Patients  will also gain an understanding of how our mind-set can impact our ability to achieve goals and the importance of cultivating a positive and growth-oriented mind-set. The purpose of this lesson is to provide patients with a deeper understanding of how to set and achieve personal goals, as well as the tools and strategies needed to overcome common obstacles which may arise along the way.  From Head to Heart: The Power of a Healthy Outlook  Clinical staff led group instruction and group discussion with PowerPoint presentation and patient guidebook. To enhance the learning environment the use of posters, models and videos may be added. Patients will be able to recognize and describe the impact of emotions and mood on physical health. They will discover the importance of self-care and explore self-care practices which may work for them. Patients will also learn how to utilize the 4 C's to cultivate a healthier outlook and better manage stress and challenges. The purpose of this lesson is to demonstrate to patients how a healthy outlook is an essential part of maintaining good health, especially as they continue their cardiac rehab journey.  Healthy Sleep for a Healthy Heart Clinical staff led group instruction and group discussion with PowerPoint presentation and patient guidebook. To enhance the learning environment the use of posters, models and videos may be added. At the conclusion of this workshop, patients will be able to demonstrate knowledge of the importance of sleep to overall health, well-being, and quality of life. They will understand the symptoms of, and treatments for, common sleep disorders. Patients will also be able to identify daytime and nighttime behaviors which impact sleep, and they will be able to apply these tools to help manage sleep-related challenges. The purpose of this lesson is to provide patients with a general overview of sleep and outline the importance of quality sleep. Patients  will learn about a few of the most common sleep disorders. Patients will also be introduced to the concept of "sleep hygiene," and discover ways to self-manage certain sleeping problems through simple daily behavior changes. Finally, the workshop will motivate patients by clarifying the links between quality sleep and their goals of heart-healthy living.   Recognizing and Reducing Stress Clinical staff led group instruction and group discussion with PowerPoint presentation and patient guidebook. To enhance the learning environment the use of posters, models and videos may be added. At the conclusion of this workshop, patients will  be able to understand the types of stress reactions, differentiate between acute and chronic stress, and recognize the impact that chronic stress has on their health. They will also be able to apply different coping mechanisms, such as reframing negative self-talk. Patients will have the opportunity to practice a variety of stress management techniques, such as deep abdominal breathing, progressive muscle relaxation, and/or guided imagery.  The purpose of this lesson is to educate patients on the role of stress in their lives and to provide healthy techniques for coping with it.  Learning Barriers/Preferences:  Learning Barriers/Preferences - 06/24/22 1311       Learning Barriers/Preferences   Learning Barriers Sight   reading glasses   Learning Preferences Computer/Internet;Individual Instruction;Pictoral;Video;Written Material;Verbal Instruction;Skilled Demonstration;Group Instruction             Education Topics:  Knowledge Questionnaire Score:  Knowledge Questionnaire Score - 06/24/22 1311       Knowledge Questionnaire Score   Pre Score 23/24             Core Components/Risk Factors/Patient Goals at Admission:  Personal Goals and Risk Factors at Admission - 06/24/22 0831       Core Components/Risk Factors/Patient Goals on Admission    Weight  Management Yes;Weight Maintenance    Intervention Weight Management: Develop a combined nutrition and exercise program designed to reach desired caloric intake, while maintaining appropriate intake of nutrient and fiber, sodium and fats, and appropriate energy expenditure required for the weight goal.;Weight Management: Provide education and appropriate resources to help participant work on and attain dietary goals.    Expected Outcomes Short Term: Continue to assess and modify interventions until short term weight is achieved;Long Term: Adherence to nutrition and physical activity/exercise program aimed toward attainment of established weight goal;Weight Maintenance: Understanding of the daily nutrition guidelines, which includes 25-35% calories from fat, 7% or less cal from saturated fats, less than 200mg  cholesterol, less than 1.5gm of sodium, & 5 or more servings of fruits and vegetables daily;Understanding recommendations for meals to include 15-35% energy as protein, 25-35% energy from fat, 35-60% energy from carbohydrates, less than 200mg  of dietary cholesterol, 20-35 gm of total fiber daily;Understanding of distribution of calorie intake throughout the day with the consumption of 4-5 meals/snacks    Hypertension Yes    Intervention Provide education on lifestyle modifcations including regular physical activity/exercise, weight management, moderate sodium restriction and increased consumption of fresh fruit, vegetables, and low fat dairy, alcohol moderation, and smoking cessation.;Monitor prescription use compliance.    Expected Outcomes Short Term: Continued assessment and intervention until BP is < 140/2mm HG in hypertensive participants. < 130/35mm HG in hypertensive participants with diabetes, heart failure or chronic kidney disease.;Long Term: Maintenance of blood pressure at goal levels.    Lipids Yes    Intervention Provide education and support for participant on nutrition & aerobic/resistive  exercise along with prescribed medications to achieve LDL 70mg , HDL >40mg .    Expected Outcomes Short Term: Participant states understanding of desired cholesterol values and is compliant with medications prescribed. Participant is following exercise prescription and nutrition guidelines.;Long Term: Cholesterol controlled with medications as prescribed, with individualized exercise RX and with personalized nutrition plan. Value goals: LDL < 70mg , HDL > 40 mg.             Core Components/Risk Factors/Patient Goals Review:   Goals and Risk Factor Review     Row Name 07/02/22 1730 07/30/22 1523 08/28/22 0940         Core Components/Risk  Factors/Patient Goals Review   Personal Goals Review Weight Management/Obesity;Hypertension;Lipids Weight Management/Obesity;Hypertension;Lipids Weight Management/Obesity;Hypertension;Lipids     Review Virla started intensive cardiac rehab on 07/01/22 and did well with exercise Vickey is doing well with exercise  intensive cardiac rehab. Vital signs have been stable. No tachyarrythmias have been noted recently will continue to montior. Eleanna is doing well with exercise  intensive cardiac rehab. Vital signs have been stable. Claudett will complete cardiac rehab on 09/06/22     Expected Outcomes Brittiany will continue to participate in intensive cardiac rehab for exercise, nutrition and lifestyle modifications Nadezhda will continue to participate in intensive cardiac rehab for exercise, nutrition and lifestyle modifications Sinia will continue to participate in intensive cardiac rehab for exercise, nutrition and lifestyle modifications              Core Components/Risk Factors/Patient Goals at Discharge (Final Review):   Goals and Risk Factor Review - 08/28/22 0940       Core Components/Risk Factors/Patient Goals Review   Personal Goals Review Weight Management/Obesity;Hypertension;Lipids    Review Anetria is doing well with exercise  intensive cardiac rehab. Vital  signs have been stable. Norielle will complete cardiac rehab on 09/06/22    Expected Outcomes Alisia will continue to participate in intensive cardiac rehab for exercise, nutrition and lifestyle modifications             ITP Comments:  ITP Comments     Row Name 06/24/22 0808 07/02/22 1728 07/30/22 1521 08/28/22 0937     ITP Comments Dr. Armanda Magic medical director. Introduction to pritikin education program/ intensive cardiac rehab. Initial orientation packet reviewed with patient. 30 Day ITP Review. Briaja started intensive cardiac rehab on 07/01/22 and did well with exercise 30 Day ITP Review. Shineka has good attendance and participation in intensive cardiac rehab. 30 Day ITP Review. Angelo has good attendance and participation in intensive cardiac rehab. Kimiah will complete intensive cardiac rehab on 09/06/22             Comments: See ITP comments.Thayer Headings RN BSN

## 2022-08-28 NOTE — Progress Notes (Signed)
ADVANCED HEART FAILURE CLINIC NOTE  Referring Physician: Ollen Bowl, MD  Primary Care: Ollen Bowl, MD Primary Cardiologist:  HPI: Regina Tate is a 77 y.o. female with Sjogren's syndrome, scleroderma, Raynaud's syndrome, rheumatoid arthritis on Enbrel, methotrexate and Plaquenil, severe aortic stenosis status post TAVR with a 23 mm Edwards SAPIEN 3 on 06/04/22.  Based on chart review she did fairly well after TAVR placement, however, repeat echocardiogram demonstrated LVEF of 45 to 50% (reduced from 60 to 65% pre-TAVR) alongside with frequent PVCs.  She was subsequently started on Toprol 25mg  XL. During periprocedural evaluation she was also noted to have an elevated RAISE score and is now s/p PYP scan which was read as Grade 1 uptake. In addition, myeloma panel including IFE was mostly unremarkable.   Today she is doing very well from a functional standpoint. She has no chest pain, SOB, LE edema or limitations from underlying HFrEF.   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 Lynnette Caffey and Dr. Laneta Simmers   Scleroderma [facility])    Sjogren's syndrome with keratoconjunctivitis sicca (HCC)    Vitamin D deficiency     Current Outpatient Medications  Medication Sig Dispense Refill   amLODipine (NORVASC) 5 MG tablet Take 5 mg by mouth daily.      amoxicillin (AMOXIL) 500 MG tablet Take 4 tablets (2,000 mg total) by mouth as directed. 1 HOUR PRIOR TO DENTAL APPOINTMENTS 12 tablet 6   Ascorbic Acid (VITAMIN C) 1000 MG tablet Take 1,000 mg by mouth daily.     aspirin EC 81 MG tablet Take 1 tablet (81 mg total) by mouth daily. Swallow whole. 90 tablet 3   atorvastatin (LIPITOR) 20 MG tablet Take 1 tablet (20 mg total) by mouth daily. 90 tablet 2   Biotin 5000 MCG TABS Take 1 capsule by mouth 3 (three) times a week.     CALCIUM PO Take 1,000 mg by mouth 2 (two) times daily. Gummies     Cholecalciferol (VITAMIN D) 2000 units CAPS Take 2,000 Units by mouth daily.     diclofenac Sodium (VOLTAREN) 1 % GEL Apply 1 Application topically daily as needed (pain).     ELDERBERRY PO Take 1 capsule by mouth daily.     ENBREL SURECLICK 50 MG/ML injection Inject 50 mg into the skin once a week.     famotidine (PEPCID) 20 MG tablet Take 20 mg by mouth 3 (three) times a week.     Flaxseed, Linseed, (FLAX SEED OIL) 1000 MG CAPS Take 1,000 mg by mouth daily.     fluticasone (FLONASE) 50 MCG/ACT nasal spray Place 1 spray into both nostrils 2 (two) times daily.     folic acid (FOLVITE) 1 MG tablet Take 1 mg by mouth daily.     hydroxychloroquine (PLAQUENIL) 200 MG  tablet Take 200 mg by mouth 2 (two) times daily.     loratadine (CLARITIN) 10 MG tablet Take 10 mg by mouth daily as needed for allergies.     methotrexate (RHEUMATREX) 2.5 MG tablet Take 10 mg by mouth 2 (two) times a week. Thursday and Friday     metoprolol succinate (TOPROL XL) 25 MG 24 hr tablet Take 1 tablet (25 mg total) by mouth daily. 90 tablet 3   Multiple Vitamin (MULTIVITAMIN) tablet Take 1 tablet by mouth daily.     Omega-3 Fatty Acids (FISH OIL) 1000 MG CAPS Take 1,000 mg by mouth in the morning and at bedtime.     omeprazole (PRILOSEC) 40 MG capsule Take 40 mg by mouth 4 (four) times a week.     pilocarpine (SALAGEN) 5 MG tablet Take 5 mg by mouth 2 (two) times daily.     Polyethyl  Glycol-Propyl Glycol (SYSTANE) 0.4-0.3 % SOLN Place 1 drop into both eyes daily as needed (lubricant).     Probiotic Product (PROBIOTIC PO) Take 1 capsule by mouth 2 (two) times a week.     Turmeric 450 MG CAPS Take 450 mg by mouth daily.     No current facility-administered medications for this visit.    Allergies  Allergen Reactions   Codeine     Other reaction(s): makes pt the next day feel like she was drugged   Molds & Smuts     Itching eyes, congestion, headache   Pollen Extract Itching    Itching eyes, congestion, headache      Social History   Socioeconomic History   Marital status: Divorced    Spouse name: Not on file   Number of children: Not on file   Years of education: Not on file   Highest education level: Not on file  Occupational History   Not on file  Tobacco Use   Smoking status: Never   Smokeless tobacco: Never  Vaping Use   Vaping Use: Never used  Substance and Sexual Activity   Alcohol use: Never   Drug use: Never   Sexual activity: Not on file  Other Topics Concern   Not on file  Social History Narrative   Not on file   Social Determinants of Health   Financial Resource Strain: Not on file  Food Insecurity: No Food Insecurity (06/05/2022)   Hunger Vital Sign    Worried About Running Out of Food in the Last Year: Never true    Ran Out of Food in the Last Year: Never true  Transportation Needs: No Transportation Needs (06/05/2022)   PRAPARE - Administrator, Civil Service (Medical): No    Lack of Transportation (Non-Medical): No  Physical Activity: Not on file  Stress: Not on file  Social Connections: Not on file  Intimate Partner Violence: Not At Risk (06/05/2022)   Humiliation, Afraid, Rape, and Kick questionnaire    Fear of Current or Ex-Partner: No    Emotionally Abused: No    Physically Abused: No    Sexually Abused: No      Family History  Problem Relation Age of Onset   Breast cancer Neg Hx     PHYSICAL  EXAM: There were no vitals filed for this visit. GENERAL: Well nourished, well developed, and in no apparent distress at rest.  HEENT: Negative for arcus senilis or xanthelasma. There is no scleral icterus.  The mucous membranes are pink and moist.   NECK: Supple, No masses. Normal carotid upstrokes  without bruits. No masses or thyromegaly.    CHEST: There are no chest wall deformities. There is no chest wall tenderness. Respirations are unlabored.  Lungs- CTA B/L CARDIAC:  JVP: 7 cm H2O         Normal S1, S2  Normal rate with regular rhythm. No murmurs, rubs or gallops.  Pulses are 2+ and symmetrical in upper and lower extremities. nO edema.  ABDOMEN: Soft, non-tender, non-distended. There are no masses or hepatomegaly. There are normal bowel sounds.  EXTREMITIES: Warm and well perfused with no cyanosis, clubbing.  LYMPHATIC: No axillary or supraclavicular lymphadenopathy.  NEUROLOGIC: Patient is oriented x3 with no focal or lateralizing neurologic deficits.  PSYCH: Patients affect is appropriate, there is no evidence of anxiety or depression.  SKIN: Warm and dry; no lesions or wounds.   DATA REVIEW  ECG: 08/28/22: NSR w/ LAFB  As per my personal interpretation  ECHO: 07/15/22: LVEF 45-50%, normal RV function.  As per my personal interpretation  CATH: 04/25/21   LV end diastolic pressure is normal.   The left ventricular ejection fraction is 55-65% by visual estimate.   1.  Mild obstructive coronary artery disease 2.  Cardiac output of 8.6 L/min and cardiac index of 5.0 L/min/m. 3.  RA pressure of 8/4 with a mean of 3 mmHg, wedge pressure of 8/6 with a mean of 4 mmHg, pulmonary artery pressure 24/7, and papi of greater than 5.   ASSESSMENT & PLAN:  Heart failure with mildly reduced EF Etiology of ZO:XWRUEA valvular 2/2 severe AS; will continue to monitor NYHA class / AHA Stage:II Volume status & Diuretics: Euvolemic Vasodilators:amlodipine 5mg  daily (will continue due to  underlying Raynauds). Plan to start ARB/ARNI at follow up.  Beta-Blocker:Toprol 25mg  daily VWU:JWJX to start at follow up.  Cardiometabolic: start farxiga 10mg  daily  Devices therapies & Valvulopathies:Not currently indicated Advanced therapies:Not indicated   2. Evaluation for cardiac amyloid - Negative PYP and myeloma panel; personally interpreted.  - Plan for repeat PYP in 1 year.   3. Severe AS s/p TAVR - TAVR with Edwards Sapien 23mm on 06/04/22  4. Sjogrens syndrome/scleroderma/raynauds - Followed by rheumatology - On Enbrel, MTX and plaquenil  - No signs of PH by [facility] on 2023 or by recent TTE; will continue screening.    Rawley Harju Advanced Heart Failure Mechanical Circulatory Support

## 2022-08-28 NOTE — Patient Instructions (Addendum)
Medication Changes:  START: JARDIANCE 10mg  ONCE DAILY   Follow-Up in: 3 MONTHS- PLEASE CALL OUR OFFICE AROUND SEPTEMBER TO GET THIS SCHEDULED NUMBER IS [phone] OPTION 2  At the [facility], you and your health needs are our priority. We have a designated team specialized in the treatment of Heart Failure. This Care Team includes your primary Heart Failure Specialized Cardiologist (physician), Advanced Practice Providers (APPs- Physician Assistants and Nurse Practitioners), and Pharmacist who all work together to provide you with the care you need, when you need it.   You may see any of the following providers on your designated Care Team at your next follow up:  Dr. Arvilla Meres Dr. Marca Ancona Dr. Marcos Eke, NP Robbie Lis, [place] [facility] [place], [place] Brynda Peon, NP Karle Plumber, PharmD   Please be sure to bring in all your medications bottles to every appointment.   Need to Contact us:  If you have any questions or concerns before your next appointment please send [place] a message through [place] or call our office at [phone].    TO LEAVE A MESSAGE FOR THE NURSE SELECT OPTION 2, PLEASE LEAVE A MESSAGE INCLUDING: YOUR NAME DATE OF BIRTH CALL BACK NUMBER REASON FOR CALL**this is important as we prioritize the call backs  YOU WILL RECEIVE A CALL BACK THE SAME DAY AS LONG AS YOU CALL BEFORE 4:00 PM

## 2022-08-30 ENCOUNTER — Encounter (HOSPITAL_COMMUNITY)
Admission: RE | Admit: 2022-08-30 | Discharge: 2022-08-30 | Disposition: A | Discharge status: Home / Self Care | Payer: Medicare Other | Source: Ambulatory Visit | Attending: Cardiology | Admitting: Cardiology

## 2022-08-30 DIAGNOSIS — Z952 Presence of prosthetic heart valve: Secondary | ICD-10-CM

## 2022-09-02 ENCOUNTER — Encounter (HOSPITAL_COMMUNITY)
Admission: RE | Admit: 2022-09-02 | Discharge: 2022-09-02 | Disposition: A | Discharge status: Home / Self Care | Payer: Medicare Other | Source: Ambulatory Visit | Attending: Cardiology | Admitting: Cardiology

## 2022-09-02 VITALS — Ht 64.0 in | Wt 141.1 lb

## 2022-09-02 DIAGNOSIS — Z952 Presence of prosthetic heart valve: Secondary | ICD-10-CM | POA: Diagnosis not present

## 2022-09-04 ENCOUNTER — Encounter (HOSPITAL_COMMUNITY)
Admission: RE | Admit: 2022-09-04 | Discharge: 2022-09-04 | Disposition: A | Discharge status: Home / Self Care | Payer: Medicare Other | Source: Ambulatory Visit | Attending: Cardiology | Admitting: Cardiology

## 2022-09-04 DIAGNOSIS — Z952 Presence of prosthetic heart valve: Secondary | ICD-10-CM | POA: Diagnosis not present

## 2022-09-04 NOTE — Progress Notes (Signed)
Discharge Progress Report  Patient Details  Name: Regina Tate MRN: [id number] Date of Birth: 12-07-1945 Referring Provider:   Flowsheet Row INTENSIVE CARDIAC REHAB ORIENT from 06/24/2022 in [facility]  Referring Provider Laurance Flatten, MD        Number of Visits: 54  Reason for Discharge:  Patient reached a stable level of exercise. Patient independent in their exercise. Patient has met program and personal goals.  Smoking History:  Social History   Tobacco Use  Smoking Status Never  Smokeless Tobacco Never    Diagnosis:  S/P TAVR (transcatheter aortic valve replacement)  ADL [facility]:   Initial Exercise Prescription:  Initial Exercise Prescription - 06/24/22 1200       Date of Initial Exercise RX and Referring Provider   Date 06/24/22    Referring Provider Laurance Flatten, MD    Expected Discharge Date 09/06/22      Recumbant Bike   Level 1.5    RPM 50    Watts 30    Minutes 15    METs 2.5      Track   Laps 18    Minutes 15    METs 2.5      Prescription Details   Frequency (times per week) 3    Duration Progress to 30 minutes of continuous aerobic without signs/symptoms of physical distress      Intensity   THRR 40-80% of Max Heartrate 58-116    Ratings of Perceived Exertion 11-13    Perceived Dyspnea 0-4      Progression   Progression Continue progressive overload as per policy without signs/symptoms or physical distress.      Resistance Training   Training Prescription Yes    Weight 1    Reps 10-15             Discharge Exercise Prescription (Final Exercise Prescription Changes):  Exercise Prescription Changes - 08/21/22 1600       Response to Exercise   Blood Pressure (Admit) 120/64    Blood Pressure (Exercise) 150/78    Blood Pressure (Exit) 100/62    Heart Rate (Admit) 81 bpm    Heart Rate (Exercise) 111 bpm    Heart Rate (Exit) 82 bpm    Rating of  Perceived Exertion (Exercise) 12    Symptoms None    Comments Reviewed Goals    Duration Continue with 30 min of aerobic exercise without signs/symptoms of physical distress.    Intensity THRR unchanged      Progression   Progression Continue to progress workloads to maintain intensity without signs/symptoms of physical distress.    Average METs 3.5      Resistance Training   Training Prescription No    Weight No weights on Wednesdays      Interval Training   Interval Training No      Recumbant Bike   Level 2    RPM 95    Watts 42    Minutes 15    METs 3.5      NuStep   Level 2    SPM 120    Minutes 15    METs 3.5      Home Exercise Plan   Plans to continue exercise at Home (comment)    Frequency Add 4 additional days to program exercise sessions.    Initial Home Exercises Provided 08/07/22             Functional Capacity:  6  Minute Walk     Row Name 06/24/22 1256 09/02/22 1250       6 Minute Walk   Phase Initial Discharge    Distance 1440 feet 1850 feet    Distance % Change -- 28.47 %    Distance Feet Change -- 410 ft    Walk Time 6 minutes 6 minutes    # of Rest Breaks 0 0    MPH 2.72 3.5    METS 3.18 3.85    RPE 9 12    Perceived Dyspnea  0 0    VO2 Peak 11.14 13.5    Symptoms No No    Resting HR 80 bpm 80 bpm    Resting BP 124/70 120/64    Resting Oxygen Saturation  99 % --    Exercise Oxygen Saturation  during 6 min walk 98 % --    Max Ex. HR 123 bpm 114 bpm    Max Ex. BP 132/78 132/70    2 Minute Post BP 112/80 --             Psychological, QOL, Others - Outcomes: PHQ 2/9:    09/04/2022    1:55 PM 06/24/2022   12:56 PM  Depression screen PHQ 2/9  Decreased Interest 0 0  Down, Depressed, Hopeless 0 0  PHQ - 2 Score 0 0  Altered sleeping 0 1  Tired, decreased energy 0 0  Change in appetite 0 0  Feeling bad or failure about yourself  0 0  Trouble concentrating 0 0  Moving slowly or fidgety/restless 0 0  Suicidal thoughts 0 0   PHQ-9 Score 0 1  Difficult doing work/chores Not difficult at all Not difficult at all    Quality of Life:  Quality of Life - 08/30/22 1500       Quality of Life Scores   Health/Function Pre 25.8 %    Health/Function Post 26.7 %    Health/Function % Change 3.49 %    Socioeconomic Pre 27.25 %    Socioeconomic Post 29.38 %    Socioeconomic % Change  7.82 %    Psych/Spiritual Pre 25.79 %    Psych/Spiritual Post 29.29 %    Psych/Spiritual % Change 13.57 %    Family Pre 26 %    Family Post 27 %    Family % Change 3.85 %    GLOBAL Pre 26.17 %    GLOBAL Post 27.9 %    GLOBAL % Change 6.61 %             Personal Goals: Goals established at orientation with interventions provided to work toward goal.  Personal Goals and Risk Factors at Admission - 06/24/22 0831       Core Components/Risk Factors/Patient Goals on Admission    Weight Management Yes;Weight Maintenance    Intervention Weight Management: Develop a combined nutrition and exercise program designed to reach desired caloric intake, while maintaining appropriate intake of nutrient and fiber, sodium and fats, and appropriate energy expenditure required for the weight goal.;Weight Management: Provide education and appropriate resources to help participant work on and attain dietary goals.    Expected Outcomes Short Term: Continue to assess and modify interventions until short term weight is achieved;Long Term: Adherence to nutrition and physical activity/exercise program aimed toward attainment of established weight goal;Weight Maintenance: Understanding of the daily nutrition guidelines, which includes 25-35% calories from fat, 7% or less cal from saturated fats, less than 200mg  cholesterol, less than 1.5gm of sodium, &  5 or more servings of fruits and vegetables daily;Understanding recommendations for meals to include 15-35% energy as protein, 25-35% energy from fat, 35-60% energy from carbohydrates, less than 200mg  of dietary  cholesterol, 20-35 gm of total fiber daily;Understanding of distribution of calorie intake throughout the day with the consumption of 4-5 meals/snacks    Hypertension Yes    Intervention Provide education on lifestyle modifcations including regular physical activity/exercise, weight management, moderate sodium restriction and increased consumption of fresh fruit, vegetables, and low fat dairy, alcohol moderation, and smoking cessation.;Monitor prescription use compliance.    Expected Outcomes Short Term: Continued assessment and intervention until BP is < 140/55mm HG in hypertensive participants. < 130/1mm HG in hypertensive participants with diabetes, heart failure or chronic kidney disease.;Long Term: Maintenance of blood pressure at goal levels.    Lipids Yes    Intervention Provide education and support for participant on nutrition & aerobic/resistive exercise along with prescribed medications to achieve LDL 70mg , HDL >40mg .    Expected Outcomes Short Term: Participant states understanding of desired cholesterol values and is compliant with medications prescribed. Participant is following exercise prescription and nutrition guidelines.;Long Term: Cholesterol controlled with medications as prescribed, with individualized exercise RX and with personalized nutrition plan. Value goals: LDL < 70mg , HDL > 40 mg.              Personal Goals Discharge:  Goals and Risk Factor Review     Row Name 07/02/22 1730 07/30/22 1523 08/28/22 0940         Core Components/Risk Factors/Patient Goals Review   Personal Goals Review Weight Management/Obesity;Hypertension;Lipids Weight Management/Obesity;Hypertension;Lipids Weight Management/Obesity;Hypertension;Lipids     Review Demira started intensive cardiac rehab on 07/01/22 and did well with exercise Basilia is doing well with exercise  intensive cardiac rehab. Vital signs have been stable. No tachyarrythmias have been noted recently will continue to montior.  Flara is doing well with exercise  intensive cardiac rehab. Vital signs have been stable. Allyse will complete cardiac rehab on 09/06/22     Expected Outcomes Lafreda will continue to participate in intensive cardiac rehab for exercise, nutrition and lifestyle modifications Aariana will continue to participate in intensive cardiac rehab for exercise, nutrition and lifestyle modifications Akaisha will continue to participate in intensive cardiac rehab for exercise, nutrition and lifestyle modifications              Exercise Goals and Review:  Exercise Goals     Row Name 06/24/22 0831             Exercise Goals   Increase Physical Activity Yes       Intervention Provide advice, education, support and counseling about physical activity/exercise needs.;Develop an individualized exercise prescription for aerobic and resistive training based on initial evaluation findings, risk stratification, comorbidities and participant's personal goals.       Expected Outcomes Short Term: Attend rehab on a regular basis to increase amount of physical activity.;Long Term: Exercising regularly at least 3-5 days a week.;Long Term: Add in home exercise to make exercise part of routine and to increase amount of physical activity.       Increase Strength and Stamina Yes       Intervention Provide advice, education, support and counseling about physical activity/exercise needs.;Develop an individualized exercise prescription for aerobic and resistive training based on initial evaluation findings, risk stratification, comorbidities and participant's personal goals.       Expected Outcomes Short Term: Perform resistance training exercises routinely during rehab and add in resistance training  at home;Short Term: Increase workloads from initial exercise prescription for resistance, speed, and METs.;Long Term: Improve cardiorespiratory fitness, muscular endurance and strength as measured by increased METs and functional capacity  ( )       Able to understand and use rate of perceived exertion (RPE) scale Yes       Intervention Provide education and explanation on how to use RPE scale       Expected Outcomes Short Term: Able to use RPE daily in rehab to express subjective intensity level;Long Term:  Able to use RPE to guide intensity level when exercising independently       Knowledge and understanding of Target Heart Rate Range (THRR) Yes       Intervention Provide education and explanation of THRR including how the numbers were predicted and where they are located for reference       Expected Outcomes Short Term: Able to state/look up THRR;Short Term: Able to use daily as guideline for intensity in rehab;Long Term: Able to use THRR to govern intensity when exercising independently       Understanding of Exercise Prescription Yes       Intervention Provide education, explanation, and written materials on patient's individual exercise prescription       Expected Outcomes Short Term: Able to explain program exercise prescription;Long Term: Able to explain home exercise prescription to exercise independently                Exercise Goals Re-Evaluation:  Exercise Goals Re-Evaluation     Row Name 07/01/22 1644 07/31/22 1526 08/21/22 1613         Exercise Goal Re-Evaluation   Exercise Goals Review Increase Physical Activity;Increase Strength and Stamina;Able to understand and use rate of perceived exertion (RPE) scale;Knowledge and understanding of Target Heart Rate Range (THRR);Understanding of Exercise Prescription Increase Physical Activity;Increase Strength and Stamina;Able to understand and use rate of perceived exertion (RPE) scale;Knowledge and understanding of Target Heart Rate Range (THRR);Understanding of Exercise Prescription Increase Physical Activity;Increase Strength and Stamina;Able to understand and use rate of perceived exertion (RPE) scale;Knowledge and understanding of Target Heart Rate Range  (THRR);Understanding of Exercise Prescription     Comments Pt's first day in the [facility] program. Pt undersntands the exercise Rx, THRR, and RPE scale. Reviewed goals. Pt voices she is making progress with her diet and eating less salt. Peak METs are 3.5. Reviewed goals. Pt continues to make progress with her diet and eating less salt. Peak METs are 3.7. Pt is exercising on her off days from the [facility] program.     Expected Outcomes Will continue to monitor patient and progress exericse workloads as tolerated. Will continue to monitor patient and progress exericse workloads as tolerated. Will continue to monitor patient and progress exericse workloads as tolerated.              Nutrition & Weight - Outcomes:  Pre Biometrics - 06/24/22 0807       Pre Biometrics   Waist Circumference 36.25 inches    Hip Circumference 40.5 inches    Waist to Hip Ratio 0.9 %    Triceps Skinfold 28 mm    % Body Fat 38.6 %    Grip Strength 14 kg    Flexibility 14.75 in    Single Leg Stand 16.25 seconds             Post Biometrics - 09/02/22 1300        Post  Biometrics   Height 5\' 4"  (1.626 m)  Weight 64 kg    Waist Circumference 35.25 inches    Hip Circumference 39 inches    Waist to Hip Ratio 0.9 %    BMI (Calculated) 24.21    Triceps Skinfold 25 mm    % Body Fat 37.3 %    Grip Strength 20 kg    Flexibility 16.5 in    Single Leg Stand 12.12 seconds             Nutrition:  Nutrition Therapy & Goals - 08/26/22 1039       Nutrition Therapy   Diet Heart Healthy Diet    Drug/Food Interactions Statins/Certain Fruits      Personal Nutrition Goals   Nutrition Goal Patient to identify strategies for reducing cardiovascular risk by attending the [employer] education and nutrition series weekly.    Personal Goal #2 Patient to improve diet quality by using the plate method as a guide for meal planning to include lean protein/plant protein, fruits, vegetables, whole grains, nonfat dairy as part  of a well-balanced diet.    Personal Goal #3 Patient to reduce sodium intake to 1500mg  per day    Comments Goals in action. Callaghan continues to attend the [employer] education and nutrition series regularly. She is down 3.3# since starting with our program. 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 Patient will benefit from participation in intensive cardiac rehab for nutrition, exercise, and lifestyle modification.      Intervention Plan   Intervention Prescribe, educate and counsel regarding individualized specific dietary modifications aiming towards targeted core components such as weight, hypertension, lipid management, diabetes, heart failure and other comorbidities.;Nutrition handout(s) given to patient.    Expected Outcomes Short Term Goal: Understand basic principles of dietary content, such as calories, fat, sodium, cholesterol and nutrients.;Long Term Goal: Adherence to prescribed nutrition plan.             Nutrition Discharge:  Nutrition Assessments - 07/01/22 1418       Rate Your Plate Scores   Pre Score 72             Education Questionnaire Score:  Knowledge Questionnaire Score - 08/30/22 1500       Knowledge Questionnaire Score   Post Score 23/24             Goals reviewed with patient; copy given to patient.Pt graduates from  Intensive/Traditional cardiac rehab program on 09/04/22 with completion of  28 exercise and 25 education sessions. Pt maintained good attendance and progressed nicely during their participation in rehab as evidenced by increased MET level. Tuongvi increased her distance on her post exercise walk test by 410 feet and lost 2.1 kg  Medication list reconciled. Repeat  PHQ score- 0 .  Pt has made significant lifestyle changes and should be commended for their success. Kelty achieved their goals during cardiac rehab.   Pt plans to continue exercise at the gym 5 days a week. Participate in [employer]  silver sneakers. Dulcey also plans to participate in a Yoga class once a week. We are proud of Ysidra's progress.Thayer Headings RN BSN

## 2022-09-06 ENCOUNTER — Encounter (HOSPITAL_COMMUNITY): Payer: Medicare Other

## 2022-09-11 ENCOUNTER — Other Ambulatory Visit: Payer: Self-pay | Admitting: *Deleted

## 2022-09-11 MED ORDER — ATORVASTATIN CALCIUM 20 MG PO TABS: 20.0000 mg | ORAL_TABLET | Freq: Every day | ORAL | 3 refills | Status: DC

## 2022-11-01 ENCOUNTER — Other Ambulatory Visit: Payer: Self-pay | Admitting: Internal Medicine

## 2022-11-01 DIAGNOSIS — Z1231 Encounter for screening mammogram for malignant neoplasm of breast: Secondary | ICD-10-CM

## 2022-12-09 ENCOUNTER — Ambulatory Visit (HOSPITAL_COMMUNITY)
Admission: RE | Admit: 2022-12-09 | Discharge: 2022-12-09 | Disposition: A | Discharge status: Home / Self Care | Payer: Medicare Other | Source: Ambulatory Visit | Attending: Cardiology | Admitting: Cardiology

## 2022-12-09 VITALS — BP 118/80 | HR 78 | Wt 138.2 lb

## 2022-12-09 DIAGNOSIS — I7300 Raynaud's syndrome without gangrene: Secondary | ICD-10-CM | POA: Diagnosis not present

## 2022-12-09 DIAGNOSIS — N390 Urinary tract infection, site not specified: Secondary | ICD-10-CM | POA: Diagnosis not present

## 2022-12-09 DIAGNOSIS — I5022 Chronic systolic (congestive) heart failure: Secondary | ICD-10-CM | POA: Insufficient documentation

## 2022-12-09 DIAGNOSIS — Z952 Presence of prosthetic heart valve: Secondary | ICD-10-CM | POA: Insufficient documentation

## 2022-12-09 DIAGNOSIS — Z79899 Other long term (current) drug therapy: Secondary | ICD-10-CM | POA: Diagnosis not present

## 2022-12-09 DIAGNOSIS — M3500 Sicca syndrome, unspecified: Secondary | ICD-10-CM | POA: Insufficient documentation

## 2022-12-09 DIAGNOSIS — Z79620 Long term (current) use of immunosuppressive biologic: Secondary | ICD-10-CM | POA: Diagnosis not present

## 2022-12-09 DIAGNOSIS — I350 Nonrheumatic aortic (valve) stenosis: Secondary | ICD-10-CM | POA: Diagnosis not present

## 2022-12-09 DIAGNOSIS — M069 Rheumatoid arthritis, unspecified: Secondary | ICD-10-CM | POA: Insufficient documentation

## 2022-12-09 DIAGNOSIS — B3749 Other urogenital candidiasis: Secondary | ICD-10-CM | POA: Insufficient documentation

## 2022-12-09 MED ORDER — SPIRONOLACTONE 25 MG PO TABS: 12.5000 mg | ORAL_TABLET | Freq: Every day | ORAL | 3 refills | Status: DC

## 2022-12-09 NOTE — Patient Instructions (Addendum)
Medication Changes:  STOP JARDIANCE   START SPIRONOLACTONE 12.5MG  BY MOUTH DAILY.   Lab Work:  BLOOD WORK IN ONE WEEK AS SCHEDULED WE WILL CALL IF ANYTHING IS ABNORMAL   Follow-Up in:  6 MONTHS with Dr. Gasper Lloyd   At the [facility], you and your health needs are our priority. We have a designated team specialized in the treatment of Heart Failure. This Care Team includes your primary Heart Failure Specialized Cardiologist (physician), Advanced Practice Providers (APPs- Physician Assistants and Nurse Practitioners), and Pharmacist who all work together to provide you with the care you need, when you need it.   You may see any of the following providers on your designated Care Team at your next follow up:  Dr. Arvilla Meres Dr. Marca Ancona Dr. Dorthula Nettles Dr. Theresia Bough Tonye Becket, NP Robbie Lis, [place] [facility] [place], [place] Brynda Peon, NP [place] Lee, NP Karle Plumber, PharmD   Please be sure to bring in all your medications bottles to every appointment.   Need to Contact us:  If you have any questions or concerns before your next appointment please send [place] a message through [place] or call our office at [phone].    TO LEAVE A MESSAGE FOR THE NURSE SELECT OPTION 2, PLEASE LEAVE A MESSAGE INCLUDING: YOUR NAME DATE OF BIRTH CALL BACK NUMBER REASON FOR CALL**this is important as we prioritize the call backs  YOU WILL RECEIVE A CALL BACK THE SAME DAY AS LONG AS YOU CALL BEFORE 4:00 PM

## 2022-12-09 NOTE — Progress Notes (Signed)
ADVANCED HEART FAILURE CLINIC NOTE  Referring Physician: Ollen Bowl, MD  Primary Care: Ollen Bowl, MD Primary Cardiologist:  HPI: Regina Tate is a 77 y.o. female with Sjogren's syndrome, scleroderma, Raynaud's syndrome, rheumatoid arthritis on Enbrel, methotrexate and Plaquenil, severe aortic stenosis status post TAVR with a 23 mm Edwards SAPIEN 3 on 06/04/22.  Based on chart review she did fairly well after TAVR placement, however, repeat echocardiogram demonstrated LVEF of 45 to 50% (reduced from 60 to 65% pre-TAVR) alongside with frequent PVCs.  She was subsequently started on Toprol 25mg  XL. During periprocedural evaluation she was also noted to have an elevated RAISE score and is now s/p PYP scan which was read as Grade 1 uptake. In addition, myeloma panel including IFE was mostly unremarkable.   Interval hx:  - Since her last appt, she reports no change in functional status or activity levels. She reports having some UTI symptoms roughly 3 weeks ago. She had a urinalysis which was +for yeast in the urine.  - Going to the gym multiples days/week; rides recumbent bike or treadmill for 30 mintutes.   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 Lynnette Caffey and Dr. Laneta Simmers   Scleroderma [facility])    Sjogren's syndrome with  keratoconjunctivitis sicca (HCC)    Vitamin D deficiency     Current Outpatient Medications  Medication Sig Dispense Refill   amLODipine (NORVASC) 5 MG tablet Take 5 mg by mouth daily.     amoxicillin (AMOXIL) 500 MG tablet Take 4 tablets (2,000 mg total) by mouth as directed. 1 HOUR PRIOR TO DENTAL APPOINTMENTS 12 tablet 6   Ascorbic Acid (VITAMIN C) 1000 MG tablet Take 1,000 mg by mouth daily.     aspirin EC 81 MG tablet Take 1 tablet (81 mg total) by mouth daily. Swallow whole. 90 tablet 3   atorvastatin (LIPITOR) 20 MG tablet Take 1 tablet (20 mg total) by mouth daily. 90 tablet 3   Biotin 5000 MCG TABS Take 1 capsule by mouth 3 (three) times a week.     CALCIUM PO Take 1,000 mg by mouth 2 (two) times daily. Gummies     Cholecalciferol (VITAMIN D) 2000 units CAPS Take 2,000 Units by mouth daily.     diclofenac Sodium (VOLTAREN) 1 % GEL Apply 1 Application topically daily as needed (pain).     ELDERBERRY PO Take 1 capsule by mouth daily.     ENBREL SURECLICK 50 MG/ML injection Inject 50 mg into the skin once a week.     famotidine (PEPCID) 20 MG tablet Take 20 mg by mouth 3 (three) times a week.     Flaxseed, Linseed, (FLAX SEED OIL) 1000 MG CAPS Take 1,000 mg by mouth daily.     fluticasone (FLONASE) 50 MCG/ACT nasal spray Place 1  spray into both nostrils 2 (two) times daily.     folic acid (FOLVITE) 1 MG tablet Take 1 mg by mouth daily.     hydroxychloroquine (PLAQUENIL) 200 MG tablet Take 200 mg by mouth 2 (two) times daily.     loratadine (CLARITIN) 10 MG tablet Take 10 mg by mouth daily as needed for allergies.     methotrexate (RHEUMATREX) 2.5 MG tablet Take 10 mg by mouth 2 (two) times a week. Thursday and Friday     metoprolol succinate (TOPROL XL) 25 MG 24 hr tablet Take 1 tablet (25 mg total) by mouth daily. 90 tablet 3   Multiple Vitamin (MULTIVITAMIN) tablet Take 1 tablet by mouth daily.     Omega-3 Fatty Acids (FISH OIL) 1000 MG CAPS Take 1,000 mg by mouth in the morning and  at bedtime.     omeprazole (PRILOSEC) 40 MG capsule Take 40 mg by mouth 4 (four) times a week.     pilocarpine (SALAGEN) 5 MG tablet Take 5 mg by mouth 2 (two) times daily.     Polyethyl Glycol-Propyl Glycol (SYSTANE) 0.4-0.3 % SOLN Place 1 drop into both eyes daily as needed (lubricant).     Probiotic Product (PROBIOTIC PO) Take 1 capsule by mouth 2 (two) times a week.     spironolactone (ALDACTONE) 25 MG tablet Take 0.5 tablets (12.5 mg total) by mouth daily. 45 tablet 3   Turmeric 450 MG CAPS Take 450 mg by mouth daily.     No current facility-administered medications for this encounter.    Allergies  Allergen Reactions   Codeine     Other reaction(s): makes pt the next day feel like she was drugged   Molds & Smuts     Itching eyes, congestion, headache   Pollen Extract Itching    Itching eyes, congestion, headache      Social History   Socioeconomic History   Marital status: Divorced    Spouse name: Not on file   Number of children: Not on file   Years of education: Not on file   Highest education level: Not on file  Occupational History   Not on file  Tobacco Use   Smoking status: Never   Smokeless tobacco: Never  Vaping Use   Vaping status: Never Used  Substance and Sexual Activity   Alcohol use: Never   Drug use: Never   Sexual activity: Not on file  Other Topics Concern   Not on file  Social History Narrative   Not on file   Social Determinants of Health   Financial Resource Strain: Not on file  Food Insecurity: No Food Insecurity (06/05/2022)   Hunger Vital Sign    Worried About Running Out of Food in the Last Year: Never true    Ran Out of Food in the Last Year: Never true  Transportation Needs: No Transportation Needs (06/05/2022)   PRAPARE - Administrator, Civil Service (Medical): No    Lack of Transportation (Non-Medical): No  Physical Activity: Not on file  Stress: Not on file  Social Connections: Not on file  Intimate Partner  Violence: Not At Risk (06/05/2022)   Humiliation, Afraid, Rape, and Kick questionnaire    Fear of Current or Ex-Partner: No    Emotionally Abused: No    Physically Abused: No    Sexually Abused: No      Family History  Problem Relation Age of Onset   Breast cancer Neg Hx     PHYSICAL  EXAM: Vitals:   12/09/22 1123  BP: 118/80  Pulse: 78  SpO2: 100%   GENERAL: Well nourished, well developed, and in no apparent distress at rest.  HEENT: Negative for arcus senilis or xanthelasma. There is no scleral icterus.  The mucous membranes are pink and moist.   NECK: Supple, No masses. Normal carotid upstrokes without bruits. No masses or thyromegaly.    CHEST: There are no chest wall deformities. There is no chest wall tenderness. Respirations are unlabored.  Lungs- CTA B/L CARDIAC:  JVP: 7 cm          Normal rate with regular rhythm. No murmurs, rubs or gallops.  Pulses are 2+ and symmetrical in upper and lower extremities. No edema.  ABDOMEN: Soft, non-tender, non-distended. There are no masses or hepatomegaly. There are normal bowel sounds.  EXTREMITIES: Warm and well perfused with no cyanosis, clubbing.  LYMPHATIC: No axillary or supraclavicular lymphadenopathy.  NEUROLOGIC: Patient is oriented x3 with no focal or lateralizing neurologic deficits.  PSYCH: Patients affect is appropriate, there is no evidence of anxiety or depression.  SKIN: Warm and dry; no lesions or wounds.    DATA REVIEW  ECG: 08/28/22: NSR w/ LAFB  As per my personal interpretation  ECHO: 07/15/22: LVEF 45-50%, normal RV function.  As per my personal interpretation  CATH: 04/25/21   LV end diastolic pressure is normal.   The left ventricular ejection fraction is 55-65% by visual estimate.   1.  Mild obstructive coronary artery disease 2.  Cardiac output of 8.6 L/min and cardiac index of 5.0 L/min/m. 3.  RA pressure of 8/4 with a mean of 3 mmHg, wedge pressure of 8/6 with a mean of 4 mmHg, pulmonary artery  pressure 24/7, and papi of greater than 5.   ASSESSMENT & PLAN:  Heart failure with mildly reduced EF Etiology of ZH:YQMVHQ valvular 2/2 severe AS; will continue to monitor NYHA class / AHA Stage:II Volume status & Diuretics: Euvolemic Vasodilators:amlodipine 5mg  daily (will continue due to underlying Raynauds). Plan to start ARB/ARNI at follow up.  Beta-Blocker:Toprol 25mg  daily MRA: start spironolactone 12.5mg  daily; repeat BMP in 7 days.  Cardiometabolic: stop jardiance; yeast infection with recurrent symptoms today.  Devices therapies & Valvulopathies:Not currently indicated Advanced therapies:Not indicated   2. Evaluation for cardiac amyloid - Negative PYP and myeloma panel; personally interpreted.  - No symptoms concern for amyloid - Will repeat PYP in 1 year.   3. Severe AS s/p TAVR - TAVR with Edwards Sapien 23mm on 06/04/22 - No murmur on exam; stable echocardiogram.   4. UTI  - Diagnosed with yeast w/ urinalysis  - Having recurrent symptoms now once more.  - Hold jardiance.   4. Sjogrens syndrome/scleroderma/raynauds - Followed by rheumatology - On Enbrel, MTX and plaquenil  - No signs of PH by [facility] on 2023 or by recent TTE; will continue screening.  - Following with Dr. Dierdre Forth; reviewed labwork. Stable.    Regina Tate Advanced Heart Failure Mechanical Circulatory Support

## 2022-12-16 ENCOUNTER — Ambulatory Visit (HOSPITAL_COMMUNITY)
Admission: RE | Admit: 2022-12-16 | Discharge: 2022-12-16 | Disposition: A | Discharge status: Home / Self Care | Payer: Medicare Other | Source: Ambulatory Visit | Attending: Cardiology

## 2022-12-16 ENCOUNTER — Ambulatory Visit
Admission: RE | Admit: 2022-12-16 | Discharge: 2022-12-16 | Disposition: A | Discharge status: Home / Self Care | Payer: Medicare Other | Source: Ambulatory Visit | Attending: Internal Medicine | Admitting: Internal Medicine

## 2022-12-16 DIAGNOSIS — Z1231 Encounter for screening mammogram for malignant neoplasm of breast: Secondary | ICD-10-CM

## 2022-12-16 DIAGNOSIS — I5022 Chronic systolic (congestive) heart failure: Secondary | ICD-10-CM | POA: Diagnosis present

## 2022-12-16 LAB — BASIC METABOLIC PANEL
Anion gap: 8 (ref 5–15)
BUN: 21 mg/dL (ref 8–23)
CO2: 25 mmol/L (ref 22–32)
Calcium: 8.7 mg/dL — ABNORMAL LOW (ref 8.9–10.3)
Chloride: 102 mmol/L (ref 98–111)
Creatinine, Ser: 0.79 mg/dL (ref 0.44–1.00)
GFR, Estimated: >60 mL/min (ref >60)
Glucose, Bld: 87 mg/dL (ref 70–99)
Potassium: 5 mmol/L (ref 3.5–5.1)
Sodium: 135 mmol/L (ref 135–145)

## 2022-12-16 LAB — BRAIN NATRIURETIC PEPTIDE: B Natriuretic Peptide: 74.9 pg/mL (ref 0.0–100.0)

## 2023-01-11 ENCOUNTER — Other Ambulatory Visit (HOSPITAL_COMMUNITY): Payer: Self-pay | Admitting: Cardiology

## 2023-01-22 ENCOUNTER — Ambulatory Visit: Payer: Medicare Other

## 2023-01-28 NOTE — Progress Notes (Unsigned)
[facility]   MULTIDISCIPLINARY HEART VALVE CLINIC                                     Cardiology Office Note:    Date:  01/28/2023   ID:  Regina Tate, DOB 06/01/1945, MRN [id number]  PCP:  Ollen Bowl, MD  [facility] Cardiologist:  Meriam Sprague, MD (Inactive)  [facility] Electrophysiologist:  None   Referring MD: Ollen Bowl, MD   No chief complaint on file. ***  History of Present Illness:    Regina Tate is a 77 y.o. 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 Lynnette Caffey and Dr. Laneta Simmers   Scleroderma [facility])    Sjogren's syndrome with keratoconjunctivitis sicca (HCC)    Vitamin D deficiency      Current Medications: No outpatient medications have been marked as taking for the 01/29/23 encounter (Appointment) with [facility].      ROS:   Please see the history of present illness.    All other systems reviewed and are negative.  EKGs       Risk Assessment/Calculations:   {Does this patient have ATRIAL FIBRILLATION?:[phone]}        Physical Exam:    VS:  There were no vitals taken for this visit.    Wt Readings from Last 3 Encounters:  12/09/22 138 lb 3.2 oz (62.7 kg)  09/02/22 141 lb 1.5 oz (64 kg)  08/28/22 139 lb (63 kg)     GEN: Well nourished, well developed in no acute distress NECK: No JVD CARDIAC: ***RRR, no murmurs, rubs, gallops RESPIRATORY:  Clear to auscultation without rales, wheezing or rhonchi  ABDOMEN: Soft, non-tender, non-distended EXTREMITIES:   No edema; No deformity.  Groin sites clear without hematoma or ecchymosis. ****  ASSESSMENT:    No diagnosis found.  PLAN:    In order of problems listed above:  Severe AS s/p TAVR: echo today shows EF ***, normally functioning TAVR with a mean gradient of *** mm hg and *** PVL. NYHA class *** symptoms. Continue ***. I will see back for 1 month/year office visit with echo.   Severe AS s/p TAVR: pt doing *** s/p TAVR. ECG with no HAVB. Groin sites healing well. SBE***. Continue ***. I will see back for 1 year/month echo and OV.        {Are you ordering a CV Procedure (e.g. stress test, cath, DCCV, TEE, etc)?   Press F2        :[id number]}    Medication Adjustments/Labs and Tests Ordered: Current medicines are reviewed at length with the patient today.  Concerns regarding medicines are outlined above.  No orders of the defined types were placed in this encounter.  No orders of the defined types were placed in this encounter.   There are no Patient Instructions on file for this visit.   Signed, Samara Deist  Geralynn Rile  01/28/2023 4:17 PM    [facility]

## 2023-01-29 ENCOUNTER — Ambulatory Visit: Payer: Medicare Other

## 2023-05-13 NOTE — Progress Notes (Signed)
 Patient ID: Regina Tate MRN: [id number] DOB/AGE: 03/19/1945 78 y.o.  Primary Care Physician:Pahwani, Kasandra Knudsen, MD Primary Cardiologist:  Lynnette Caffey, MD   FOCUSED CARDIOVASCULAR PROBLEM LIST:   1.  Moderate aortic stenosis  Status post 23 mm SAPIEN 3 valve April 2024  2.  Sjogren's syndrome 3.  Scleroderma 4.  Raynaud's syndrome 5.  Rheumatoid arthritis on Enbrel, methotrexate, and plaquenil 6.  Cardiomyopathy  EF 45 to 50%, G1 DD, TAVR MG 10, TTE May 2024   HISTORY OF PRESENT ILLNESS: Today  July 2023 visit:  The patient is seen for follow-up.  When I saw her last she was enrolled in the PROGRESS trial regarding moderate symptomatic aortic stenosis.  She was randomized to the clinical surveillance arm of the trial.  She was seen by Dr. Shari Prows a few months ago.  She reported increasing dyspnea on exertion.  She also reported possible TIA like symptoms.  Carotid ultrasound was negative.  Her monitor was reassuring and showed no atrial fibrillation.  She was seen by neurology and an MRI and MRI were reassuring.  Today she is relatively unchanged.  She can exercise for about 15 minutes and then needs to stop and take a rest.  This is about the same as before.  She denies any chest pain, palpitations, paroxysmal nocturnal dyspnea, orthopnea, presyncope or syncope.  She feels like her symptoms are relatively stable and not gotten worse whatsoever.  She is planning to go to [place] for a few weeks and to visit [employer].  Plan:  6 month follow up with TTE.  January 2024: Patient had an echocardiogram in the interim which demonstrated some progression of her aortic stenosis.  However she is still squarely in the moderate range, her stroke-volume index is around 40 cc/m.  Yeah the patient tells me that she has developed a little bit more shortness of breath versus when I saw her last.  When she walks up the ramp to go to the call CM or when she goes up to her seat at the  tanker center she does feel short of breath.  She has had some chronic dizziness issues predating my last visit with her.  This would happen may be once or twice a week particularly in the morning.  It seems to be better if she drinks water or eats something.  Now they might happen may be 2 or 3 times.  She has had no frank syncope however.  They seem to get better when she drinks water.  She was counseled by her primary cardiologist to drink water in the morning before she gets up.  She fortunately has not required any emergency room visits or hospitalizations.  She went on a trip to [facility] in the interim since I saw her last and did fairly well there.  Plan: 22-month follow-up.  March 2024: In the interim the patient had an echocardiogram that I reviewed which demonstrated a decrease in left ventricular ejection fraction down to 45 to 50%.  She tells me she has been more short of breath of late.  She endorses NYHA class II symptoms of shortness of breath.  She has had some mild lightheadedness but no frank syncope.  She is also developed exertional left shoulder pain.  This does not happen at rest.  She fortunately has not recurred required any emergency room visits or hospitalizations.  She did have a lot of dental work done in December and she tells me her teeth are in  good condition.  Plan: Refer for TAVR as part of PROGRESS trial.  March 2025: Patient consents to use of AI scribe. In the interim the patient had uncomplicated TAVR in April 2024.  She is referred for a PYP study which was equivocal for ATTR amyloidosis.  Myeloma panel was negative.  She is seen by advanced heart failure in October and was doing very well without cardiovascular complaints.  She feels good overall with no chest pain, shortness of breath, leg swelling, or orthopnea. No recent hospitalizations or emergency room visits. She feels better than before the valve replacement, now able to go to the gym daily, participate in [employer]ne[employer] classes, and walk on the treadmill.  No adverse bleeding or bruising with her current medication regimen, which includes aspirin. No lightheadedness, syncope, palpitations, or arrhythmias.   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 Lynnette Caffey and Dr. Laneta Simmers   Scleroderma [facility])    Sjogren's syndrome with keratoconjunctivitis sicca (HCC)    Vitamin D deficiency     Past Surgical History:  Procedure Laterality Date   DIAGNOSTIC LAPAROSCOPY     age 61   INTRAOPERATIVE TRANSTHORACIC ECHOCARDIOGRAM N/A 06/04/2022   Procedure: INTRAOPERATIVE TRANSTHORACIC ECHOCARDIOGRAM;  Surgeon: Orbie Pyo, MD;  Location: [facility];  Service: Open Heart Surgery;  Laterality: N/A;   RIGHT/LEFT HEART CATH AND CORONARY ANGIOGRAPHY N/A 04/25/2021   Procedure: RIGHT/LEFT HEART CATH AND CORONARY ANGIOGRAPHY;  Surgeon: Orbie Pyo, MD;  Location: [facility] INV[facility] CV [facility];  Service: Cardiovascular;  Laterality: N/A;   TRANSCATHETER AORTIC VALVE REPLACEMENT, TRANSFEMORAL N/A 06/04/2022   Procedure: Transcatheter Aortic Valve Replacement, Transfemoral;  Surgeon: Orbie Pyo, MD;  Location: [facility] CV [facility];  Service: Open Heart Surgery;  Laterality: N/A;    Family History  Problem Relation Age of Onset   Breast cancer Neg Hx     Social History   Socioeconomic History   Marital status: Divorced    Spouse name: Not on file   Number of children: Not on file   Years of education: Not on file   Highest education level: Not on file  Occupational History   Not on file  Tobacco Use   Smoking status: Never    Smokeless tobacco: Never  Vaping Use   Vaping status: Never Used  Substance and Sexual Activity   Alcohol use: Never   Drug use: Never   Sexual activity: Not on file  Other Topics Concern   Not on file  Social History Narrative   Not on file   Social Drivers of Health   Financial Resource Strain: Not on file  Food Insecurity: No Food Insecurity (06/05/2022)   Hunger Vital Sign    Worried About Running Out of Food in the Last Year: Never true    Ran Out of Food in the Last Year: Never true  Transportation Needs: No Transportation Needs (06/05/2022)   PRAPARE - Administrator, Civil Service (Medical): No    Lack of Transportation (Non-Medical): No  Physical Activity:  Not on file  Stress: Not on file  Social Connections: Not on file  Intimate Partner Violence: Not At Risk (06/05/2022)   Humiliation, Afraid, Rape, and Kick questionnaire    Fear of Current or Ex-Partner: No    Emotionally Abused: No    Physically Abused: No    Sexually Abused: No     Prior to Admission medications   Medication Sig Start Date End Date Taking? Authorizing Provider  amLODipine (NORVASC) 5 MG tablet Take 5 mg by mouth daily. 11/12/16   [provider]  atorvastatin (LIPITOR) 20 MG tablet Take 1 tablet (20 mg total) by mouth daily. 12/25/20 03/25/21  Meriam Sprague, MD  Biotin 5 MG CAPS Take 1 capsule by mouth daily.    [provider]  Bromelains 500 MG TABS Take by mouth daily.    [provider]  CALCIUM PO Take 1 tablet by mouth daily.    [provider]  Cholecalciferol (VITAMIN D) 2000 units CAPS Take 1 capsule by mouth daily.    [provider]  ENBREL SURECLICK 50 MG/ML injection Inject 1 mL into the skin once a week. 09/24/16   [provider]  famotidine (PEPCID) 40 MG tablet Take 40 mg by mouth 3 (three) times a week.    [provider]  Flaxseed, Linseed, (FLAX SEED OIL) 1300 MG CAPS Take 1 capsule by mouth daily.     [provider]  fluticasone (FLONASE) 50 MCG/ACT nasal spray Place 1 spray into both nostrils daily.    [provider]  folic acid (FOLVITE) 1 MG tablet Take 1 mg by mouth daily. 11/11/16   [provider]  hydroxychloroquine (PLAQUENIL) 200 MG tablet Take 2 tablets by mouth daily. 11/11/16   [provider]  methotrexate (RHEUMATREX) 2.5 MG tablet Take 8 tablets by mouth once a week. 11/13/16   [provider]  Multiple Vitamin (MULTIVITAMIN) tablet Take 1 tablet by mouth daily.    [provider]  Omega-3 Fatty Acids (FISH OIL) 1000 MG CAPS Take 1 capsule by mouth daily.    [provider]  omeprazole (PRILOSEC) 40 MG capsule Take 1 capsule by mouth 4 (four) times a week. 11/20/16   [provider]  pilocarpine (SALAGEN) 5 MG tablet Take 1 tablet by mouth 2 (two) times daily. 11/12/16   [provider]  Probiotic Product (PROBIOTIC PO) Take 1 tablet by mouth daily.    [provider]  Turmeric 450 MG CAPS Take 1 tablet by mouth daily.    [provider]    Allergies  Allergen Reactions   Codeine     Other reaction(s): makes pt the next day feel like she was drugged   Molds & Smuts     Itching eyes, congestion, headache   Pollen Extract Itching    Itching eyes, congestion, headache    REVIEW OF SYSTEMS:  General: no fevers/chills/night sweats Eyes: no blurry vision, diplopia, or amaurosis ENT: no sore throat or hearing loss Resp: no cough, wheezing, or hemoptysis CV: no edema or palpitations GI: no abdominal pain, nausea, vomiting, diarrhea, or constipation GU: no dysuria, frequency, or hematuria Skin: no rash Neuro: no headache, numbness, tingling, or weakness of extremities Musculoskeletal: no joint pain or swelling Heme: no bleeding, DVT, or easy bruising Endo: no polydipsia or polyuria  BP 126/80   Pulse 64   Ht 5' 4.5" (1.638 m)   Wt 138 lb (62.6 kg)   SpO2 97%   BMI 23.32  kg/m   PHYSICAL EXAM: GEN:  AO x 3 in no acute distress HEENT: normal Dentition: Normal Neck: JVP normal. +2 carotid upstrokes without bruits. No thyromegaly. Lungs: equal expansion, clear bilaterally CV: Apex is discrete and nondisplaced, RRR with 3/6 SEM  Abd: soft, non-tender, non-distended; no bruit; positive bowel sounds Ext: no edema, ecchymoses, or cyanosis Vascular: 2+ femoral pulses, 2+ radial pulses       Skin: warm and dry without rash Neuro: CN II-XII grossly intact; motor and sensory grossly intact    DATA AND STUDIES:  EKG: Performed today that I reviewed demonstrates normal sinus rhythm  May 2024 1. Compared with the echo 05/2022, new wall motion abnormalities are now  present. Left ventricular ejection fraction, by estimation, is 45 to 50%.  The left ventricle has mildly decreased function. The left ventricle has  no regional wall motion  abnormalities. Left ventricular diastolic parameters are consistent with  Grade I diastolic dysfunction (impaired relaxation). The average left  ventricular global longitudinal strain is -20.1 %. The global longitudinal  strain is normal.   2. Right ventricular systolic function is normal. The right ventricular  size is normal.   3. The mitral valve is normal in structure. Trivial mitral valve  regurgitation. No evidence of mitral stenosis.   4. The aortic valve has been repaired/replaced. Aortic valve  regurgitation is not visualized. No aortic stenosis is present. There is a  23 mm Edwards Sapien prosthetic (TAVR) valve present in the aortic  position. Procedure Date: 06/05/2022. Echo findings   are consistent with normal structure and function of the aortic valve  prosthesis. Aortic valve area, by VTI measures 1.89 cm. Aortic valve mean  gradient measures 10.0 mmHg. Aortic valve Vmax measures 2.15 m/s.   5. The inferior vena cava is normal in size with greater than 50%  respiratory variability, suggesting right atrial  pressure of 3 mmHg.   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  Echocardiogram final read is pending however ejection fraction looks preserved and mean gradient across the TAVR valve is around 7 mmHg. Chronic systolic heart failure: Has been followed by advanced heart failure.  Will follow-up echocardiogram read today and consider starting  Entresto 24 x 26 mm twice daily and Jardiance 10 mg daily.  Continue Toprol 25 mg daily, and spironolactone 25 mg daily.  Start Jardiance 10 mg daily. Scleroderma: Continue current therapy. Sjogren's syndrome: Continue Plaquenil 200 mg twice daily Remote arthritis: Continue methotrexate 10 mg 2 times a week. Raynaud's phenomenon: On amlodipine 5 mg daily.  I spent 33 minutes reviewing all clinical data during and prior to this visit including all relevant imaging studies, laboratories, clinical information from other health systems and prior notes from both Cardiology and other specialties, interviewing the patient, conducting a complete physical examination, and coordinating care in order to formulate a comprehensive and personalized evaluation and treatment plan.   Orbie Pyo, MD  05/23/2023 11:41 AM    [facility] [address] [place], [place],  [place]  [address] Phone: [phone]; Fax: [phone]

## 2023-05-23 ENCOUNTER — Encounter: Admitting: *Deleted

## 2023-05-23 ENCOUNTER — Ambulatory Visit (INDEPENDENT_AMBULATORY_CARE_PROVIDER_SITE_OTHER): Payer: Medicare Other | Admitting: Internal Medicine

## 2023-05-23 ENCOUNTER — Encounter: Payer: Self-pay | Admitting: Internal Medicine

## 2023-05-23 ENCOUNTER — Ambulatory Visit: Payer: Medicare Other | Attending: Internal Medicine

## 2023-05-23 VITALS — BP 126/80 | HR 64 | Ht 64.5 in | Wt 138.0 lb

## 2023-05-23 DIAGNOSIS — M368 Systemic disorders of connective tissue in other diseases classified elsewhere: Secondary | ICD-10-CM | POA: Insufficient documentation

## 2023-05-23 DIAGNOSIS — Z952 Presence of prosthetic heart valve: Secondary | ICD-10-CM | POA: Diagnosis present

## 2023-05-23 DIAGNOSIS — I7300 Raynaud's syndrome without gangrene: Secondary | ICD-10-CM | POA: Diagnosis present

## 2023-05-23 DIAGNOSIS — M069 Rheumatoid arthritis, unspecified: Secondary | ICD-10-CM | POA: Insufficient documentation

## 2023-05-23 DIAGNOSIS — I5022 Chronic systolic (congestive) heart failure: Secondary | ICD-10-CM | POA: Insufficient documentation

## 2023-05-23 DIAGNOSIS — Z006 Encounter for examination for normal comparison and control in clinical research program: Secondary | ICD-10-CM

## 2023-05-23 DIAGNOSIS — M349 Systemic sclerosis, unspecified: Secondary | ICD-10-CM | POA: Insufficient documentation

## 2023-05-23 DIAGNOSIS — M3500 Sicca syndrome, unspecified: Secondary | ICD-10-CM | POA: Insufficient documentation

## 2023-05-23 NOTE — Addendum Note (Signed)
 Addended by: Teodora Medici D on: 05/23/2023 12:11 PM   Modules accepted: Orders

## 2023-05-23 NOTE — Patient Instructions (Signed)
 Medication Instructions:  No changes today *If you need a refill on your cardiac medications before your next appointment, please call your pharmacy*  Lab Work: none If you have labs (blood work) drawn today and your tests are completely normal, you will receive your results only by: MyChart Message (if you have MyChart) OR A paper copy in the mail If you have any lab test that is abnormal or we need to change your treatment, we will call you to review the results.  Testing/Procedures: none  Follow-Up: At [facility], you and your health needs are our priority.  As part of our continuing mission to provide you with exceptional heart care, our providers are all part of one team.  This team includes your primary Cardiologist (physician) and Advanced Practice Providers or APPs (Physician Assistants and Nurse Practitioners) who all work together to provide you with the care you need, when you need it.  Your next appointment:   6 month(s)  Provider:   Alverda Skeans, MD  We recommend signing up for the patient portal called "MyChart".  Sign up information is provided on this After Visit Summary.  MyChart is used to connect with patients for Virtual Visits (Telemedicine).  Patients are able to view lab/test results, encounter notes, upcoming appointments, etc.  Non-urgent messages can be sent to your provider as well.   To learn more about what you can do with MyChart, go to [email or web address].   Other Instructions       1st Floor: - Lobby - Registration  - Pharmacy  - Lab - Cafe  2nd Floor: - PV Lab - Diagnostic Testing (echo, CT, nuclear med)  3rd Floor: - Vacant  4th Floor: - TCTS (cardiothoracic surgery) - AFib Clinic - Structural Heart Clinic - Vascular Surgery  - Vascular Ultrasound  5th Floor: - HeartCare Cardiology (general and EP) - Clinical Pharmacy for coumadin, hypertension, lipid, weight-loss medications, and med management  appointments    Valet parking services will be available as well.

## 2023-05-24 LAB — PRO B NATRIURETIC PEPTIDE: NT-Pro BNP: 195 pg/mL (ref 0–738)

## 2023-05-26 ENCOUNTER — Other Ambulatory Visit: Payer: Self-pay

## 2023-05-26 LAB — ECHOCARDIOGRAM COMPLETE
AR max vel: 2.4 cm2
AV Area VTI: 2.5 cm2
AV Area mean vel: 2.21 cm2
AV Mean grad: 7 mmHg
AV Peak grad: 12.3 mmHg
Ao pk vel: 1.75 m/s
Area-P 1/2: 2.73 cm2
S' Lateral: 3.3 cm

## 2023-05-26 MED ORDER — METOPROLOL SUCCINATE ER 25 MG PO TB24: 25.0000 mg | ORAL_TABLET | Freq: Every day | ORAL | 3 refills | Status: DC

## 2023-05-26 NOTE — Research (Signed)
 2 Year Post Randomization F/U  BP 126/80   Pulse 64   Ht 5' 4.5" (1.638 m)   Wt 138 lb (62.6 kg)   SpO2 97%   BMI 23.32 kg/m     Medications:  Current Outpatient Medications:    amLODipine (NORVASC) 5 MG tablet, Take 5 mg by mouth daily., Disp: , Rfl:    amoxicillin (AMOXIL) 500 MG tablet, Take 4 tablets (2,000 mg total) by mouth as directed. 1 HOUR PRIOR TO DENTAL APPOINTMENTS, Disp: 12 tablet, Rfl: 6   Ascorbic Acid (VITAMIN C) 1000 MG tablet, Take 1,000 mg by mouth daily., Disp: , Rfl:    aspirin EC 81 MG tablet, Take 1 tablet (81 mg total) by mouth daily. Swallow whole., Disp: 90 tablet, Rfl: 3   atorvastatin (LIPITOR) 20 MG tablet, Take 1 tablet (20 mg total) by mouth daily., Disp: 90 tablet, Rfl: 3   Biotin 5000 MCG TABS, Take 1 capsule by mouth 3 (three) times a week., Disp: , Rfl:    Bromelains (BROMELAIN PO), , Disp: , Rfl:    CALCIUM PO, Take 1,000 mg by mouth 2 (two) times daily. Gummies, Disp: , Rfl:    Cholecalciferol (VITAMIN D) 2000 units CAPS, Take 2,000 Units by mouth daily., Disp: , Rfl:    diclofenac Sodium (VOLTAREN) 1 % GEL, Apply 1 Application topically daily as needed (pain)., Disp: , Rfl:    ELDERBERRY PO, Take 1 capsule by mouth daily., Disp: , Rfl:    ENBREL SURECLICK 50 MG/ML injection, Inject 50 mg into the skin once a week., Disp: , Rfl:    famotidine (PEPCID) 20 MG tablet, Take 20 mg by mouth 3 (three) times a week., Disp: , Rfl:    Flaxseed, Linseed, (FLAX SEED OIL) 1000 MG CAPS, Take 1,000 mg by mouth daily., Disp: , Rfl:    fluticasone (FLONASE) 50 MCG/ACT nasal spray, Place 1 spray into both nostrils 2 (two) times daily., Disp: , Rfl:    folic acid (FOLVITE) 1 MG tablet, Take 1 mg by mouth daily., Disp: , Rfl:    hydroxychloroquine (PLAQUENIL) 200 MG tablet, Take 200 mg by mouth 2 (two) times daily., Disp: , Rfl:    loratadine (CLARITIN) 10 MG tablet, Take 10 mg by mouth daily as needed for allergies., Disp: , Rfl:    methotrexate (RHEUMATREX)  2.5 MG tablet, Take 10 mg by mouth 2 (two) times a week. Thursday and Friday, Disp: , Rfl:    metoprolol succinate (TOPROL XL) 25 MG 24 hr tablet, Take 1 tablet (25 mg total) by mouth daily., Disp: 90 tablet, Rfl: 3   Multiple Vitamin (MULTIVITAMIN) tablet, Take 1 tablet by mouth daily., Disp: , Rfl:    Omega-3 Fatty Acids (FISH OIL) 1000 MG CAPS, Take 1,000 mg by mouth in the morning and at bedtime., Disp: , Rfl:    omeprazole (PRILOSEC) 40 MG capsule, Take 40 mg by mouth 4 (four) times a week., Disp: , Rfl:    pilocarpine (SALAGEN) 5 MG tablet, Take 5 mg by mouth 2 (two) times daily., Disp: , Rfl:    Polyethyl Glycol-Propyl Glycol (SYSTANE) 0.4-0.3 % SOLN, Place 1 drop into both eyes daily as needed (lubricant)., Disp: , Rfl:    Probiotic Product (PROBIOTIC PO), Take 1 capsule by mouth 2 (two) times a week., Disp: , Rfl:    spironolactone (ALDACTONE) 25 MG tablet, Take 0.5 tablets (12.5 mg total) by mouth daily., Disp: 45 tablet, Rfl: 3   Turmeric 450 MG CAPS, Take  450 mg by mouth daily., Disp: , Rfl:    Wheat Dextrin (BENEFIBER DRINK MIX PO), , Disp: , Rfl:       New York Heart Association  (NYHA) ll   [place] Cardiovascular Society Score (CCS) angina grade: l   Transthoracic echocardiogram (TTE), including DSE as applicable: Completed 05/23/2023   12- lead electrocardiogram:     Labs: See labs  KCCQ: See worksheet   [id number]: See worksheet   SF-36:  See worksheet

## 2023-06-18 ENCOUNTER — Other Ambulatory Visit (HOSPITAL_COMMUNITY): Payer: Medicare Other

## 2023-06-18 ENCOUNTER — Ambulatory Visit: Payer: Medicare Other

## 2023-07-04 ENCOUNTER — Telehealth (HOSPITAL_COMMUNITY): Payer: Self-pay | Admitting: Cardiology

## 2023-07-04 NOTE — Telephone Encounter (Signed)
 Front office left patient a voice message to reschedule patients appt with Dr. Bruce Caper on Tuesday, 07/08/2023. Per Sherolyn Dixon, RN, Dr. Bruce Caper will not be in office (provider just had a baby).

## 2023-07-07 ENCOUNTER — Telehealth (HOSPITAL_COMMUNITY): Payer: Self-pay

## 2023-07-07 NOTE — Telephone Encounter (Signed)
 Called to confirm/remind patient of their appointment at [facility] on 07/08/2023 10:00.   Appointment:   [] Confirmed  [x] Left mess   [] No answer/No voice mail  [] VM Full/unable to leave message  [] Phone not in service  Patient reminded to bring all medications and/or complete list.  Confirmed patient has transportation. Gave directions, instructed to utilize valet parking.

## 2023-07-07 NOTE — Progress Notes (Signed)
 ADVANCED HEART FAILURE CLINIC NOTE  Referring Physician: Elester Grim, MD  Primary Care: Elester Grim, MD Primary Cardiologist:  HPI: Regina Tate is a 78 y.o. female with Sjogren's syndrome, scleroderma, Raynaud's syndrome, rheumatoid arthritis on Enbrel, methotrexate and Plaquenil , severe aortic stenosis status post TAVR with a 23 mm Edwards SAPIEN 3 on 06/04/22.  Based on chart review she did fairly well after TAVR placement, however, repeat echocardiogram demonstrated LVEF of 45 to 50% (reduced from 60 to 65% pre-TAVR) alongside with frequent PVCs.  She was subsequently started on Toprol  25mg  XL. During periprocedural evaluation she was also noted to have an elevated RAISE score and is now s/p PYP scan which was read as Grade 1 uptake. In addition, myeloma panel including IFE was mostly unremarkable.   Interval hx:  -Today she returns for AHF follow up. Overall feeling ***. Denies palpitations, CP, dizziness, edema, or PND/Orthopnea. *** SOB. Appetite ok. No fever or chills. Weight at home *** pounds. Taking all medications. Denies ETOH, tobacco or drug use.  - Going to the gym multiples days/week; rides recumbent bike or treadmill for 30 mintutes. 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 Lorie Rook and Dr. Sherene Dilling   Scleroderma  [facility])    Sjogren's syndrome with keratoconjunctivitis sicca (HCC)    Vitamin D deficiency     Current Outpatient Medications  Medication Sig Dispense Refill   amLODipine (NORVASC) 5 MG tablet Take 5 mg by mouth daily.     amoxicillin  (AMOXIL ) 500 MG tablet Take 4 tablets (2,000 mg total) by mouth as directed. 1 HOUR PRIOR TO DENTAL APPOINTMENTS 12 tablet 6   Ascorbic Acid (VITAMIN C) 1000 MG tablet Take 1,000 mg by mouth daily.     aspirin  EC 81 MG tablet Take 1 tablet (81 mg total) by mouth daily. Swallow whole. 90 tablet 3   atorvastatin  (LIPITOR) 20 MG tablet Take 1 tablet (20 mg total) by mouth daily. 90 tablet 3   Biotin 5000 MCG TABS Take 1 capsule by mouth 3 (three) times a week.     Bromelains (BROMELAIN PO)      CALCIUM  PO Take 1,000 mg by mouth 2 (two) times daily. Gummies     Cholecalciferol (VITAMIN D) 2000 units CAPS Take 2,000 Units by mouth daily.     diclofenac Sodium (VOLTAREN) 1 % GEL Apply 1 Application topically daily as needed (pain).     ELDERBERRY PO Take 1 capsule by mouth daily.     ENBREL SURECLICK 50 MG/ML injection Inject 50 mg into the skin once a week.     famotidine (PEPCID) 20 MG tablet Take 20 mg by mouth 3 (three) times a week.     Flaxseed, Linseed, (FLAX SEED OIL) 1000 MG CAPS Take 1,000 mg by mouth daily.  fluticasone (FLONASE) 50 MCG/ACT nasal spray Place 1 spray into both nostrils 2 (two) times daily.     folic acid (FOLVITE) 1 MG tablet Take 1 mg by mouth daily.     hydroxychloroquine  (PLAQUENIL ) 200 MG tablet Take 200 mg by mouth 2 (two) times daily.     loratadine (CLARITIN) 10 MG tablet Take 10 mg by mouth daily as needed for allergies.     methotrexate (RHEUMATREX) 2.5 MG tablet Take 10 mg by mouth 2 (two) times a week. Thursday and Friday     metoprolol  succinate (TOPROL  XL) 25 MG 24 hr tablet Take 1 tablet (25 mg total) by mouth daily. 90 tablet 3   Multiple Vitamin (MULTIVITAMIN) tablet Take 1 tablet by mouth daily.     Omega-3 Fatty Acids  (FISH OIL) 1000 MG CAPS Take 1,000 mg by mouth in the morning and at bedtime.     omeprazole (PRILOSEC) 40 MG capsule Take 40 mg by mouth 4 (four) times a week.     pilocarpine  (SALAGEN ) 5 MG tablet Take 5 mg by mouth 2 (two) times daily.     Polyethyl Glycol-Propyl Glycol (SYSTANE) 0.4-0.3 % SOLN Place 1 drop into both eyes daily as needed (lubricant).     Probiotic Product (PROBIOTIC PO) Take 1 capsule by mouth 2 (two) times a week.     spironolactone  (ALDACTONE ) 25 MG tablet Take 0.5 tablets (12.5 mg total) by mouth daily. 45 tablet 3   Turmeric 450 MG CAPS Take 450 mg by mouth daily.     Wheat Dextrin (BENEFIBER DRINK MIX PO)      No current facility-administered medications for this visit.    Allergies  Allergen Reactions   Codeine     Other reaction(s): makes pt the next day feel like she was drugged   Molds & Smuts     Itching eyes, congestion, headache   Pollen Extract Itching    Itching eyes, congestion, headache      Social History   Socioeconomic History   Marital status: Divorced    Spouse name: Not on file   Number of children: Not on file   Years of education: Not on file   Highest education level: Not on file  Occupational History   Not on file  Tobacco Use   Smoking status: Never   Smokeless tobacco: Never  Vaping Use   Vaping status: Never Used  Substance and Sexual Activity   Alcohol use: Never   Drug use: Never   Sexual activity: Not on file  Other Topics Concern   Not on file  Social History Narrative   Not on file   Social Drivers of Health   Financial Resource Strain: Not on file  Food Insecurity: No Food Insecurity (06/05/2022)   Hunger Vital Sign    Worried About Running Out of Food in the Last Year: Never true    Ran Out of Food in the Last Year: Never true  Transportation Needs: No Transportation Needs (06/05/2022)   PRAPARE - Administrator, Civil Service (Medical): No    Lack of Transportation (Non-Medical): No  Physical  Activity: Not on file  Stress: Not on file  Social Connections: Not on file  Intimate Partner Violence: Not At Risk (06/05/2022)   Humiliation, Afraid, Rape, and Kick questionnaire    Fear of Current or Ex-Partner: No    Emotionally Abused: No    Physically Abused: No    Sexually Abused: No  Family History  Problem Relation Age of Onset   Breast cancer Neg Hx     PHYSICAL EXAM: There were no vitals filed for this visit. General:  *** appearing.  No respiratory difficulty HEENT: normal Neck: supple. JVD *** cm.  Cor: PMI nondisplaced. Regular rate & rhythm. No rubs, gallops or murmurs. Lungs: clear Abdomen: soft, nontender, nondistended. Good bowel sounds. Extremities: no cyanosis, clubbing, rash, edema  Neuro: alert & oriented x 3. Moves all 4 extremities w/o difficulty. Affect pleasant.   Wt Readings from Last 3 Encounters:  05/23/23 62.6 kg (138 lb)  12/09/22 62.7 kg (138 lb 3.2 oz)  09/02/22 64 kg (141 lb 1.5 oz)     DATA REVIEW  ECG: 08/28/22: NSR w/ LAFB  As per my personal interpretation  ECHO: 07/15/22: LVEF 45-50%, normal RV function.  As per my personal interpretation  CATH: 04/25/21   LV end diastolic pressure is normal.   The left ventricular ejection fraction is 55-65% by visual estimate.   1.  Mild obstructive coronary artery disease 2.  Cardiac output of 8.6 L/min and cardiac index of 5.0 L/min/m. 3.  RA pressure of 8/4 with a mean of 3 mmHg, wedge pressure of 8/6 with a mean of 4 mmHg, pulmonary artery pressure 24/7, and papi of greater than 5.   ASSESSMENT & PLAN:  Heart failure with mildly reduced EF Etiology of ZO:XWRUEA valvular 2/2 severe AS; will continue to monitor NYHA class / AHA Stage:II Volume status & Diuretics: Euvolemic Vasodilators:amlodipine 5mg  daily (will continue due to underlying Raynauds). Plan to start ARB/ARNI at follow up. *** Beta-Blocker:Toprol  25mg  daily MRA: start spironolactone  12.5mg  daily; repeat BMP in 7 days.   Cardiometabolic: Off Jardiance  with recurrent yeast infection Devices therapies & Valvulopathies:Not currently indicated Advanced therapies:Not indicated   2. Evaluation for cardiac amyloid - Negative PYP and myeloma panel - No symptoms concern for amyloid - Will repeat PYP in 1 year. ***  3. Severe AS s/p TAVR - TAVR with Edwards Sapien 23mm on 06/04/22 - No murmur on exam; stable echocardiogram.   4. UTI  - recurrent. Keep off SGLT2i  4. Sjogrens syndrome/scleroderma/raynauds - Followed by rheumatology - On Enbrel, MTX and plaquenil   - No signs of PH by [facility] on 2023 or by recent TTE; will continue screening.  - Following with Dr. Ebbie Goldmann; reviewed labwork. Stable.    Sheryl Donna AGACNP-BC

## 2023-07-08 ENCOUNTER — Encounter (HOSPITAL_COMMUNITY): Payer: Self-pay

## 2023-07-08 ENCOUNTER — Ambulatory Visit (HOSPITAL_COMMUNITY): Payer: Self-pay | Admitting: Internal Medicine

## 2023-07-08 ENCOUNTER — Encounter (HOSPITAL_COMMUNITY): Admitting: Cardiology

## 2023-07-08 ENCOUNTER — Ambulatory Visit (HOSPITAL_COMMUNITY)
Admission: RE | Admit: 2023-07-08 | Discharge: 2023-07-08 | Disposition: A | Discharge status: Home / Self Care | Source: Ambulatory Visit | Attending: Internal Medicine | Admitting: Internal Medicine

## 2023-07-08 VITALS — BP 128/78 | HR 74 | Ht 64.5 in | Wt 142.6 lb

## 2023-07-08 DIAGNOSIS — Z87440 Personal history of urinary (tract) infections: Secondary | ICD-10-CM | POA: Diagnosis not present

## 2023-07-08 DIAGNOSIS — Z79899 Other long term (current) drug therapy: Secondary | ICD-10-CM | POA: Insufficient documentation

## 2023-07-08 DIAGNOSIS — I7300 Raynaud's syndrome without gangrene: Secondary | ICD-10-CM | POA: Insufficient documentation

## 2023-07-08 DIAGNOSIS — Z952 Presence of prosthetic heart valve: Secondary | ICD-10-CM | POA: Diagnosis not present

## 2023-07-08 DIAGNOSIS — M3500 Sicca syndrome, unspecified: Secondary | ICD-10-CM | POA: Diagnosis not present

## 2023-07-08 DIAGNOSIS — N390 Urinary tract infection, site not specified: Secondary | ICD-10-CM

## 2023-07-08 DIAGNOSIS — I5022 Chronic systolic (congestive) heart failure: Secondary | ICD-10-CM | POA: Insufficient documentation

## 2023-07-08 DIAGNOSIS — M349 Systemic sclerosis, unspecified: Secondary | ICD-10-CM | POA: Insufficient documentation

## 2023-07-08 LAB — BASIC METABOLIC PANEL WITH GFR
Anion gap: 6 (ref 5–15)
BUN: 17 mg/dL (ref 8–23)
CO2: 28 mmol/L (ref 22–32)
Calcium: 8.9 mg/dL (ref 8.9–10.3)
Chloride: 101 mmol/L (ref 98–111)
Creatinine, Ser: 0.77 mg/dL (ref 0.44–1.00)
GFR, Estimated: >60 mL/min (ref >60)
Glucose, Bld: 87 mg/dL (ref 70–99)
Potassium: 4.6 mmol/L (ref 3.5–5.1)
Sodium: 135 mmol/L (ref 135–145)

## 2023-07-08 LAB — BRAIN NATRIURETIC PEPTIDE: B Natriuretic Peptide: 137.9 pg/mL — ABNORMAL HIGH (ref 0.0–100.0)

## 2023-07-08 MED ORDER — LOSARTAN POTASSIUM 25 MG PO TABS: 12.5000 mg | ORAL_TABLET | Freq: Every evening | ORAL | 3 refills | Status: DC

## 2023-07-08 MED ORDER — FUROSEMIDE 40 MG PO TABS: 40.0000 mg | ORAL_TABLET | ORAL | 3 refills | Status: AC | PRN

## 2023-07-08 NOTE — Patient Instructions (Signed)
 START Losartan 12.5 mg nightly.  Labs done today, your results will be available in MyChart, we will contact you for abnormal readings.  Your physician recommends that you schedule a follow-up appointment in: 6 months ( October) ** PLEASE CALL THE OFFICE AUGUST TO ARRANGE YOUR FOLLOW UP APPOINTMENT.**  If you have any questions or concerns before your next appointment please send us  a message through [place] or call our office at [phone].    TO LEAVE A MESSAGE FOR THE NURSE SELECT OPTION 2, PLEASE LEAVE A MESSAGE INCLUDING: YOUR NAME DATE OF BIRTH CALL BACK NUMBER REASON FOR CALL**this is important as we prioritize the call backs  YOU WILL RECEIVE A CALL BACK THE SAME DAY AS LONG AS YOU CALL BEFORE 4:00 PM  At the [facility], you and your health needs are our priority. As part of our continuing mission to provide you with exceptional heart care, we have created designated Provider Care Teams. These Care Teams include your primary Cardiologist (physician) and Advanced Practice Providers (APPs- Physician Assistants and Nurse Practitioners) who all work together to provide you with the care you need, when you need it.   You may see any of the following providers on your designated Care Team at your next follow up: Dr Jules Oar Dr Peder Bourdon Dr. Alwin Baars Dr. Arta Lark Amy Marijane Shoulders, NP Ruddy Corral, [place] [facility], [place] Dennise Fitz, NP [place] Lee, NP Shawnee Dellen, NP Luster Salters, PharmD Bevely Brush, PharmD   Please be sure to bring in all your medications bottles to every appointment.    Thank you for choosing [facility]

## 2023-07-11 ENCOUNTER — Other Ambulatory Visit: Payer: Self-pay

## 2023-07-11 MED ORDER — ATORVASTATIN CALCIUM 20 MG PO TABS: 20.0000 mg | ORAL_TABLET | Freq: Every day | ORAL | 3 refills | Status: AC

## 2023-07-24 ENCOUNTER — Telehealth (HOSPITAL_COMMUNITY): Payer: Self-pay | Admitting: Cardiology

## 2023-07-24 NOTE — Telephone Encounter (Signed)
 Patient reports leg pain is not related to swelling/edema  Aware to stop losartan   Monitor b/p at the time symptoms start and nightly at bedtime

## 2023-07-24 NOTE — Telephone Encounter (Signed)
 Patient called to report leg pain, nausea, light headiness, and trouble sleeping since starting losartan   Reports none of the symptoms above were present before starting med two weeks ago  No vitals available  Please advise

## 2023-09-16 ENCOUNTER — Other Ambulatory Visit (HOSPITAL_COMMUNITY): Payer: Self-pay | Admitting: Cardiology

## 2023-11-04 ENCOUNTER — Other Ambulatory Visit: Payer: Self-pay | Admitting: Internal Medicine

## 2023-11-04 DIAGNOSIS — Z1231 Encounter for screening mammogram for malignant neoplasm of breast: Secondary | ICD-10-CM

## 2023-11-10 NOTE — Progress Notes (Unsigned)
 Patient ID: Regina Tate MRN: [id number] DOB/AGE: 03-26-1945 77 y.o.  Primary Care Physician:Pahwani, Velna SAUNDERS, MD Primary Cardiologist:  Wendel, MD   FOCUSED CARDIOVASCULAR PROBLEM LIST:   Moderate aortic stenosis Status post 23 SAPIEN 29 May 2022 Progress trial participant Sjogren syndrome Scleroderma Raynaud's syndrome Rheumatoid arthritis Cardiomyopathy G1 DD, TAVR MG 10, EF 45 to 50% TTE May 2024 G1 DD, TAVR MG 7, EF 55 to 60% TTE March 2025 Intolerant of losartan  Patient defers Jardiance  Left bundle branch block   HISTORY OF PRESENT ILLNESS: Today  July 2023 visit:  The patient is seen for follow-up.  When I saw her last she was enrolled in the PROGRESS trial regarding moderate symptomatic aortic stenosis.  She was randomized to the clinical surveillance arm of the trial.  She was seen by Dr. Hobart a few months ago.  She reported increasing dyspnea on exertion.  She also reported possible TIA like symptoms.  Carotid ultrasound was negative.  Her monitor was reassuring and showed no atrial fibrillation.  She was seen by neurology and an MRI and MRI were reassuring.  Today she is relatively unchanged.  She can exercise for about 15 minutes and then needs to stop and take a rest.  This is about the same as before.  She denies any chest pain, palpitations, paroxysmal nocturnal dyspnea, orthopnea, presyncope or syncope.  She feels like her symptoms are relatively stable and not gotten worse whatsoever.  She is planning to go to [place]  for a few weeks and to visit [place].  Plan:  6 month follow up with TTE.  January 2024: Patient had an echocardiogram in the interim which demonstrated some progression of her aortic stenosis.  However she is still squarely in the moderate range, her stroke-volume index is around 40 cc/m.  Yeah the patient tells me that she has developed a little bit more shortness of breath versus when I saw her last.  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  She has had some chronic dizziness issues predating my last visit with her.  This would happen may be once or twice a week particularly in the morning.  It seems to be better if she drinks water or eats something.  Now they might happen may be 2 or 3 times.  She has had no frank syncope however.  They seem to get better when she drinks water.  She was counseled by her primary cardiologist to drink water in the morning before she gets up.  She fortunately has not required any emergency room visits or hospitalizations.  She went on a trip to [facility] in the interim since I saw her last and did fairly well there.  Plan: 27-month follow-up.  March 2024: In the interim the patient had an echocardiogram that I reviewed which demonstrated a decrease in left ventricular ejection fraction down to 45 to 50%.  She tells me she has been more short of breath of late.  She endorses NYHA class II symptoms of shortness of breath.  She has had some mild lightheadedness but no frank syncope.  She is also developed exertional left shoulder pain.  This does not happen at rest.  She fortunately has not recurred required any emergency room visits or hospitalizations.  She did have a lot of dental work done in December and she tells me her  teeth are in good condition.  Plan: Refer for TAVR as part of PROGRESS trial.  March 2025: Patient consents to use of AI scribe. In the interim the patient had uncomplicated TAVR in April 2024.  She is referred for a PYP study which was equivocal for ATTR amyloidosis.  Myeloma panel was negative.  She is seen by advanced heart failure in October and was doing very well without cardiovascular complaints.  She feels good overall with no chest pain, shortness of breath, leg swelling, or orthopnea. No recent hospitalizations or emergency room visits. She feels better than before the valve  replacement, now able to go to the gym daily, participate in Silver [employer]neakers classes, and walk on the treadmill.  No adverse bleeding or bruising with her current medication regimen, which includes aspirin . No lightheadedness, syncope, palpitations, or arrhythmias.  Plan: Follow-up echocardiogram, start Jardiance  10 mg daily  September 2025:  Patient consents to use of AI scribe. In the interim the patient's echocardiogram demonstrated normalized LV function.  She contacted us  in May due to leg swelling thought related to losartan  so this was stopped.   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 Wendel and Dr. Lucas   Scleroderma [facility])    Sjogren's syndrome with keratoconjunctivitis sicca (HCC)    Vitamin D deficiency     Past Surgical History:  Procedure Laterality Date   DIAGNOSTIC LAPAROSCOPY     age 34   INTRAOPERATIVE TRANSTHORACIC ECHOCARDIOGRAM N/A 06/04/2022   Procedure: INTRAOPERATIVE TRANSTHORACIC ECHOCARDIOGRAM;  Surgeon: Icey Tello K, MD;  Location: [facility] INVASIVE CV LAB;  Service: Open Heart Surgery;  Laterality: N/A;   RIGHT/LEFT HEART CATH AND CORONARY ANGIOGRAPHY N/A 04/25/2021   Procedure: RIGHT/LEFT HEART CATH AND CORONARY ANGIOGRAPHY;  Surgeon: Wendel Lurena POUR, MD;  Location: [facility] INVASIVE CV LAB;  Service: Cardiovascular;  Laterality: N/A;   TRANSCATHETER AORTIC VALVE REPLACEMENT, TRANSFEMORAL N/A 06/04/2022   Procedure: Transcatheter Aortic Valve Replacement, Transfemoral;  Surgeon: Hser Belanger K, MD;  Location: [facility] INVASIVE CV LAB;  Service: Open Heart Surgery;  Laterality: N/A;     Family History  Problem Relation Age of Onset   Breast cancer Neg Hx     Social History   Socioeconomic History   Marital status: Divorced    Spouse name: Not on file   Number of children: Not on file   Years of education: Not on file   Highest education level: Not on file  Occupational History   Not on file  Tobacco Use   Smoking status: Never   Smokeless tobacco: Never  Vaping Use   Vaping status: Never Used  Substance and Sexual Activity   Alcohol use: Never   Drug use: Never   Sexual activity: Not on file  Other Topics Concern   Not on file  Social History Narrative   Not on file   Social Drivers of Health   Financial Resource Strain: Not on file  Food Insecurity: No Food Insecurity (06/05/2022)   Hunger Vital Sign    Worried About Running Out of  Food in the Last Year: Never true    Ran Out of Food in the Last Year: Never true  Transportation Needs: No Transportation Needs (06/05/2022)   PRAPARE - Administrator, Civil Service (Medical): No    Lack of Transportation (Non-Medical): No  Physical Activity: Not on file  Stress: Not on file  Social Connections: Not on file  Intimate Partner Violence: Not At Risk (06/05/2022)   Humiliation, Afraid, Rape, and Kick questionnaire    Fear of Current or Ex-Partner: No    Emotionally Abused: No    Physically Abused: No    Sexually Abused: No     Prior to Admission medications   Medication Sig Start Date End Date Taking? Authorizing Provider  amLODipine (NORVASC) 5 MG tablet Take 5 mg by mouth daily. 11/12/16   [provider]  atorvastatin  (LIPITOR) 20 MG tablet Take 1 tablet (20 mg total) by mouth daily. 12/25/20 03/25/21  Hobart Powell BRAVO, MD  Biotin 5 MG CAPS Take 1 capsule by mouth daily.    [provider]  Bromelains 500 MG TABS Take by mouth daily.    [provider]  CALCIUM  PO Take 1 tablet by mouth daily.    [provider]  Cholecalciferol (VITAMIN D) 2000  units CAPS Take 1 capsule by mouth daily.    [provider]  ENBREL SURECLICK 50 MG/ML injection Inject 1 mL into the skin once a week. 09/24/16   [provider]  famotidine (PEPCID) 40 MG tablet Take 40 mg by mouth 3 (three) times a week.    [provider]  Flaxseed, Linseed, (FLAX SEED OIL) 1300 MG CAPS Take 1 capsule by mouth daily.    [provider]  fluticasone (FLONASE) 50 MCG/ACT nasal spray Place 1 spray into both nostrils daily.    [provider]  folic acid (FOLVITE) 1 MG tablet Take 1 mg by mouth daily. 11/11/16   [provider]  hydroxychloroquine  (PLAQUENIL ) 200 MG tablet Take 2 tablets by mouth daily. 11/11/16   [provider]  methotrexate (RHEUMATREX) 2.5 MG tablet Take 8 tablets by mouth once a week. 11/13/16   [provider]  Multiple Vitamin (MULTIVITAMIN) tablet Take 1 tablet by mouth daily.    [provider]  Omega-3 Fatty Acids (FISH OIL) 1000 MG CAPS Take 1 capsule by mouth daily.    [provider]  omeprazole (PRILOSEC) 40 MG capsule Take 1 capsule by mouth 4 (four) times a week. 11/20/16   [provider]  pilocarpine  (SALAGEN ) 5 MG tablet Take 1 tablet by mouth 2 (two) times daily. 11/12/16   [provider]  Probiotic Product (PROBIOTIC PO) Take 1 tablet by mouth daily.    [provider]  Turmeric 450 MG CAPS Take 1 tablet by mouth daily.    [provider]    Allergies  Allergen Reactions   Codeine     Other reaction(s): makes pt the next day feel like she was drugged   Molds & Smuts     Itching eyes, congestion, headache   Pollen Extract Itching    Itching eyes, congestion, headache    REVIEW OF SYSTEMS:  General: no fevers/chills/night sweats Eyes: no blurry vision, diplopia, or amaurosis ENT: no sore throat or hearing loss Resp: no cough, wheezing, or hemoptysis CV: no edema or palpitations GI: no abdominal pain, nausea,  vomiting, diarrhea, or constipation GU: no dysuria, frequency, or hematuria Skin: no rash Neuro: no headache, numbness,  tingling, or weakness of extremities Musculoskeletal: no joint pain or swelling Heme: no bleeding, DVT, or easy bruising Endo: no polydipsia or polyuria  There were no vitals taken for this visit.  PHYSICAL EXAM: GEN:  AO x 3 in no acute distress HEENT: normal Dentition: Normal Neck: JVP normal. +2 carotid upstrokes without bruits. No thyromegaly. Lungs: equal expansion, clear bilaterally CV: Apex is discrete and nondisplaced, RRR with 3/6 SEM  Abd: soft, non-tender, non-distended; no bruit; positive bowel sounds Ext: no edema, ecchymoses, or cyanosis Vascular: 2+ femoral pulses, 2+ radial pulses       Skin: warm and dry without rash Neuro: CN II-XII grossly intact; motor and sensory grossly intact    DATA AND STUDIES:  EKG: 2025 normal sinus rhythm with left bundle branch block  May 2024 1. Compared with the echo 05/2022, new wall motion abnormalities are now  present. Left ventricular ejection fraction, by estimation, is 45 to 50%.  The left ventricle has mildly decreased function. The left ventricle has  no regional wall motion  abnormalities. Left ventricular diastolic parameters are consistent with  Grade I diastolic dysfunction (impaired relaxation). The average left  ventricular global longitudinal strain is -20.1 %. The global longitudinal  strain is normal.   2. Right ventricular systolic function is normal. The right ventricular  size is normal.   3. The mitral valve is normal in structure. Trivial mitral valve  regurgitation. No evidence of mitral stenosis.   4. The aortic valve has been repaired/replaced. Aortic valve  regurgitation is not visualized. No aortic stenosis is present. There is a  23 mm Edwards Sapien prosthetic (TAVR) valve present in the aortic  position. Procedure Date: 06/05/2022. Echo findings   are consistent with normal  structure and function of the aortic valve  prosthesis. Aortic valve area, by VTI measures 1.89 cm. Aortic valve mean  gradient measures 10.0 mmHg. Aortic valve Vmax measures 2.15 m/s.   5. The inferior vena cava is normal in size with greater than 50%  respiratory variability, suggesting right atrial pressure of 3 mmHg.   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  Last echocardiogram with reassuring findings  Chronic systolic heart failure: Continue  Toprol  25 mg daily and spironolactone  25 mg daily.   Scleroderma: Continue current therapy. Sjogren's syndrome: Continue Plaquenil  200 mg twice daily Remote arthritis: Continue methotrexate 10 mg 2 times a week. Raynaud's phenomenon: On amlodipine 5 mg daily.  I spent *** minutes reviewing all clinical data during and prior to this visit including all relevant imaging studies, laboratories, clinical information from other health systems and prior notes from both Cardiology and other specialties, interviewing the patient, conducting a complete physical examination, and coordinating care in order to formulate a comprehensive and personalized evaluation and treatment plan.   Dylann Gallier K Elasia Furnish, MD  11/10/2023 1:54 PM    [facility] [address] [place], [place], [place]  [address] Phone: [phone]; Fax: [phone]

## 2023-11-13 ENCOUNTER — Encounter: Payer: Self-pay | Admitting: Internal Medicine

## 2023-11-13 ENCOUNTER — Ambulatory Visit: Attending: Internal Medicine | Admitting: Internal Medicine

## 2023-11-13 VITALS — BP 134/66 | HR 62 | Ht 64.0 in | Wt 138.4 lb

## 2023-11-13 DIAGNOSIS — I2510 Atherosclerotic heart disease of native coronary artery without angina pectoris: Secondary | ICD-10-CM

## 2023-11-13 DIAGNOSIS — M349 Systemic sclerosis, unspecified: Secondary | ICD-10-CM | POA: Diagnosis not present

## 2023-11-13 DIAGNOSIS — Z952 Presence of prosthetic heart valve: Secondary | ICD-10-CM | POA: Diagnosis not present

## 2023-11-13 DIAGNOSIS — M069 Rheumatoid arthritis, unspecified: Secondary | ICD-10-CM

## 2023-11-13 DIAGNOSIS — M3500 Sicca syndrome, unspecified: Secondary | ICD-10-CM

## 2023-11-13 DIAGNOSIS — M368 Systemic disorders of connective tissue in other diseases classified elsewhere: Secondary | ICD-10-CM

## 2023-11-13 DIAGNOSIS — I5022 Chronic systolic (congestive) heart failure: Secondary | ICD-10-CM

## 2023-11-13 DIAGNOSIS — E785 Hyperlipidemia, unspecified: Secondary | ICD-10-CM

## 2023-11-13 DIAGNOSIS — I7300 Raynaud's syndrome without gangrene: Secondary | ICD-10-CM

## 2023-11-13 NOTE — Patient Instructions (Signed)
 Medication Instructions:  No medication changes were made at this visit. Continue current regimen.   *If you need a refill on your cardiac medications before your next appointment, please call your pharmacy*  Lab Work: To be completed today: lipid panel, LFT, and lipoprotein-A  If you have labs (blood work) drawn today and your tests are completely normal, you will receive your results only by: MyChart Message (if you have MyChart) OR A paper copy in the mail If you have any lab test that is abnormal or we need to change your treatment, we will call you to review the results.  Testing/Procedures: None ordered today.  Follow-Up: At [facility], you and your health needs are our priority.  As part of our continuing mission to provide you with exceptional heart care, our providers are all part of one team.  This team includes your primary Cardiologist (physician) and Advanced Practice Providers or APPs (Physician Assistants and Nurse Practitioners) who all work together to provide you with the care you need, when you need it.  Your next appointment:   1 year(s)  Provider:   Glendia Ferrier, PA-C

## 2023-11-14 ENCOUNTER — Ambulatory Visit: Payer: Self-pay | Admitting: Internal Medicine

## 2023-11-14 LAB — LIPID PANEL
Chol/HDL Ratio: 1.7 ratio (ref 0.0–4.4)
Cholesterol, Total: 134 mg/dL (ref 100–199)
HDL: 80 mg/dL (ref >39)
LDL Chol Calc (NIH): 43 mg/dL (ref 0–99)
Triglycerides: 47 mg/dL (ref 0–149)
VLDL Cholesterol Cal: 11 mg/dL (ref 5–40)

## 2023-11-14 LAB — HEPATIC FUNCTION PANEL
ALT: 24 IU/L (ref 0–32)
AST: 32 IU/L (ref 0–40)
Albumin: 4.4 g/dL (ref 3.8–4.8)
Alkaline Phosphatase: 76 IU/L (ref 49–135)
Bilirubin Total: 0.3 mg/dL (ref 0.0–1.2)
Bilirubin, Direct: 0.15 mg/dL (ref 0.00–0.40)
Total Protein: 6.1 g/dL (ref 6.0–8.5)

## 2023-11-14 LAB — LIPOPROTEIN A (LPA): Lipoprotein (a): 226.9 nmol/L — ABNORMAL HIGH (ref <75.0)

## 2023-11-17 ENCOUNTER — Other Ambulatory Visit: Payer: Self-pay

## 2023-11-18 MED ORDER — AMOXICILLIN 500 MG PO TABS: 2000.0000 mg | ORAL_TABLET | ORAL | 6 refills | Status: AC

## 2023-11-18 NOTE — Telephone Encounter (Signed)
 Pt of Dr. Wendel. Please advise on a refill of this.

## 2023-11-21 ENCOUNTER — Ambulatory Visit: Admitting: Internal Medicine

## 2023-12-18 ENCOUNTER — Ambulatory Visit
Admission: RE | Admit: 2023-12-18 | Discharge: 2023-12-18 | Disposition: A | Discharge status: Home / Self Care | Source: Ambulatory Visit | Attending: Internal Medicine | Admitting: Internal Medicine

## 2023-12-18 DIAGNOSIS — Z1231 Encounter for screening mammogram for malignant neoplasm of breast: Secondary | ICD-10-CM

## 2024-01-19 ENCOUNTER — Encounter (HOSPITAL_COMMUNITY): Admitting: Cardiology

## 2024-01-19 NOTE — Progress Notes (Signed)
 ADVANCED HEART FAILURE CLINIC NOTE  Referring Physician: Vernon Velna SAUNDERS, MD  Primary Care: Vernon Velna SAUNDERS, MD AHF Primary Cardiologist:  HPI: Regina Tate is a 78 y.o. female with Sjogren's syndrome, scleroderma, Raynaud's syndrome, rheumatoid arthritis on Enbrel, methotrexate and Plaquenil , severe aortic stenosis status post TAVR with a 23 mm Edwards SAPIEN 3 on 06/04/22.  Based on chart review she did fairly well after TAVR placement, however, repeat echocardiogram demonstrated LVEF of 45 to 50% (reduced from 60 to 65% pre-TAVR) alongside with frequent PVCs.  She was subsequently started on Toprol  25mg  XL. During periprocedural evaluation she was also noted to have an elevated RAISE score and is now s/p PYP scan which was read as Grade 1 uptake. In addition, myeloma panel including IFE was mostly unremarkable.   04/2023 LVEF 55-60% RV low normal.   She was last seen by Dr Wendel in September. Stable at that time.   Today she returns for HF follow up.Overall feeling fine. Denies SOB/PND/Orthopnea. Appetite ok. No fever or chills. Weight at home 135-136 Taking all medications.Exercising 5 days a week.   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 Wendel and Dr. Lucas   Scleroderma [facility])    Sjogren's syndrome with keratoconjunctivitis sicca    Vitamin D deficiency     Current Outpatient Medications  Medication Sig Dispense Refill   amLODipine (NORVASC) 5 MG tablet Take 5 mg by mouth daily.     amoxicillin  (AMOXIL ) 500 MG tablet Take 4 tablets (2,000 mg total) by mouth as directed. 1  HOUR PRIOR TO DENTAL APPOINTMENTS 12 tablet 6   Ascorbic Acid (VITAMIN C) 1000 MG tablet Take 1,000 mg by mouth daily.     aspirin  EC 81 MG tablet Take 1 tablet (81 mg total) by mouth daily. Swallow whole. 90 tablet 3   atorvastatin  (LIPITOR) 20 MG tablet Take 1 tablet (20 mg total) by mouth daily. 90 tablet 3   Biotin 5000 MCG TABS Take 1 capsule by mouth 3 (three) times a week.     Bromelains (BROMELAIN PO)      CALCIUM  PO Take 1,000 mg by mouth 2 (two) times daily. Gummies     Cholecalciferol (VITAMIN D) 2000 units CAPS Take 2,000 Units by mouth daily.     colestipol (COLESTID) 1 g tablet      diclofenac Sodium (VOLTAREN) 1 % GEL Apply 1 Application topically daily as needed (pain).     ELDERBERRY PO Take 1 capsule by mouth daily.     ENBREL SURECLICK 50 MG/ML injection Inject 50 mg into the skin once a week.     EPINEPHrine 0.3 mg/0.3 mL IJ SOAJ injection Inject into the muscle as directed.     famotidine (PEPCID) 20 MG tablet Take 20 mg by mouth 3 (three) times a week.     Flaxseed, Linseed, (FLAX SEED OIL) 1000 MG CAPS Take 1,000 mg by mouth daily.     fluticasone (FLONASE) 50 MCG/ACT nasal spray Place 1 spray into both nostrils 2 (two) times daily.  folic acid (FOLVITE) 1 MG tablet Take 1 mg by mouth daily.     furosemide  (LASIX ) 40 MG tablet Take 1 tablet (40 mg total) by mouth as needed. Take as directed by the heart failure clinic 10 tablet 3   hydrocortisone (ANUSOL-HC) 2.5 % rectal cream SMARTSIG:1 Application Rectally As Directed PRN     hydroxychloroquine  (PLAQUENIL ) 200 MG tablet Take 200 mg by mouth 2 (two) times daily.     loratadine (CLARITIN) 10 MG tablet Take 10 mg by mouth daily as needed for allergies.     methotrexate (RHEUMATREX) 2.5 MG tablet Take 10 mg by mouth 2 (two) times a week. Thursday and Friday     metoprolol  succinate (TOPROL  XL) 25 MG 24 hr tablet Take 1 tablet (25 mg total) by mouth daily. 90 tablet 3   Multiple Vitamin (MULTIVITAMIN) tablet Take 1  tablet by mouth daily.     Omega-3 Fatty Acids (FISH OIL) 1000 MG CAPS Take 1,000 mg by mouth in the morning and at bedtime.     omeprazole (PRILOSEC) 40 MG capsule Take 40 mg by mouth 4 (four) times a week.     pilocarpine  (SALAGEN ) 5 MG tablet Take 5 mg by mouth 2 (two) times daily.     Polyethyl Glycol-Propyl Glycol (SYSTANE) 0.4-0.3 % SOLN Place 1 drop into both eyes daily as needed (lubricant).     Probiotic Product (PROBIOTIC PO) Take 1 capsule by mouth 2 (two) times a week.     spironolactone  (ALDACTONE ) 25 MG tablet TAKE ONE-HALF TABLET BY MOUTH  DAILY 45 tablet 3   Turmeric 450 MG CAPS Take 450 mg by mouth daily.     Wheat Dextrin (BENEFIBER DRINK MIX PO)      No current facility-administered medications for this encounter.    Allergies  Allergen Reactions   Codeine     Other reaction(s): makes pt the next day feel like she was drugged   Molds & Smuts     Itching eyes, congestion, headache   Pollen Extract Itching    Itching eyes, congestion, headache      Social History   Socioeconomic History   Marital status: Divorced    Spouse name: Not on file   Number of children: Not on file   Years of education: Not on file   Highest education level: Not on file  Occupational History   Not on file  Tobacco Use   Smoking status: Never   Smokeless tobacco: Never  Vaping Use   Vaping status: Never Used  Substance and Sexual Activity   Alcohol use: Never   Drug use: Never   Sexual activity: Not on file  Other Topics Concern   Not on file  Social History Narrative   Not on file   Social Drivers of Health   Financial Resource Strain: Not on file  Food Insecurity: No Food Insecurity (06/05/2022)   Hunger Vital Sign    Worried About Running Out of Food in the Last Year: Never true    Ran Out of Food in the Last Year: Never true  Transportation Needs: No Transportation Needs (06/05/2022)   PRAPARE - Administrator, Civil Service (Medical): No    Lack of  Transportation (Non-Medical): No  Physical Activity: Not on file  Stress: Not on file  Social Connections: Not on file  Intimate Partner Violence: Not At Risk (06/05/2022)   Humiliation, Afraid, Rape, and Kick questionnaire    Fear of Current or Ex-Partner: No  Emotionally Abused: No    Physically Abused: No    Sexually Abused: No      Family History  Problem Relation Age of Onset   Breast cancer Neg Hx     PHYSICAL EXAM: Vitals:   01/20/24 1356  BP: 120/72  Pulse: 67  SpO2: 99%   General:   No resp difficulty Neck: no JVD.  Cor: Regular rate & rhythm.  Lungs: clear Abdomen: soft, nontender, nondistended.  Extremities: no  edema Neuro: alert & oriented x3  Wt Readings from Last 3 Encounters:  01/20/24 64.2 kg (141 lb 9.6 oz)  11/13/23 62.8 kg (138 lb 6.4 oz)  07/08/23 64.7 kg (142 lb 9.6 oz)    DATA REVIEW ECG: 08/28/22: NSR w/ LAFB    ECHO: 07/15/22: LVEF 45-50%, normal RV function.   04/2023: LVEF 55-60% RV low normal   CATH: 04/25/21   LV end diastolic pressure is normal.   The left ventricular ejection fraction is 55-65% by visual estimate.  1.  Mild obstructive coronary artery disease 2.  Cardiac output of 8.6 L/min and cardiac index of 5.0 L/min/m. 3.  RA pressure of 8/4 with a mean of 3 mmHg, wedge pressure of 8/6 with a mean of 4 mmHg, pulmonary artery pressure 24/7, and papi of greater than 5.   ASSESSMENT & PLAN: Heart Failure Improved EF Etiology of YQ:Opxzob valvular 2/2 severe AS 04/2023 Echo improved LVEF 55-60% NYHA class / AHA Stage: NYHA I.  Volume status & Diuretics: Euvolemic. Does not loop diuretics.  Vasodilators:amlodipine 5mg  daily (will continue due to underlying Raynauds). Off losartan  .  Beta-Blocker:Continue Toprol  25mg  daily MRA: Off spiro for now.  Cardiometabolic: Off Jardiance  with recurrent yeast infection/UTIs> would not re challenge.   2. Severe AS s/p TAVR - TAVR with Edwards Sapien 23mm on 06/04/22  -Negative PYP and  myeloma panel  3. Sjogrens syndrome/scleroderma/raynauds - Followed by rheumatology - On Enbrel, MTX and plaquenil   - No signs of PH by [facility] on 2023 or by recent TTE - Following with Dr. Mai; reviewed labwork. Stable.  -Follow up in 6 months for screening Echo due to scleroderma. Referred to Dr Cherrie.     Noell Shular NP-C

## 2024-01-20 ENCOUNTER — Ambulatory Visit (HOSPITAL_COMMUNITY)
Admission: RE | Admit: 2024-01-20 | Discharge: 2024-01-20 | Disposition: A | Discharge status: Home / Self Care | Source: Ambulatory Visit | Attending: Adult Health | Admitting: Adult Health

## 2024-01-20 VITALS — BP 120/72 | HR 67 | Wt 141.6 lb

## 2024-01-20 DIAGNOSIS — Z952 Presence of prosthetic heart valve: Secondary | ICD-10-CM | POA: Insufficient documentation

## 2024-01-20 DIAGNOSIS — M069 Rheumatoid arthritis, unspecified: Secondary | ICD-10-CM | POA: Diagnosis not present

## 2024-01-20 DIAGNOSIS — I5022 Chronic systolic (congestive) heart failure: Secondary | ICD-10-CM | POA: Insufficient documentation

## 2024-01-20 DIAGNOSIS — Z79899 Other long term (current) drug therapy: Secondary | ICD-10-CM | POA: Insufficient documentation

## 2024-01-20 DIAGNOSIS — Z79620 Long term (current) use of immunosuppressive biologic: Secondary | ICD-10-CM | POA: Diagnosis not present

## 2024-01-20 DIAGNOSIS — I7300 Raynaud's syndrome without gangrene: Secondary | ICD-10-CM | POA: Diagnosis present

## 2024-01-20 DIAGNOSIS — M349 Systemic sclerosis, unspecified: Secondary | ICD-10-CM | POA: Diagnosis present

## 2024-01-20 DIAGNOSIS — M3500 Sicca syndrome, unspecified: Secondary | ICD-10-CM | POA: Diagnosis present

## 2024-01-20 NOTE — Patient Instructions (Signed)
  Follow-Up in: 6 months with an Echo PLEASE CALL OUR OFFICE AROUND MARCH 2026 TO GET SCHEDULED FOR YOUR APPOINTMENT. PHONE NUMBER IS [phone] OPTION 2   At the [facility], you and your health needs are our priority. We have a designated team specialized in the treatment of Heart Failure. This Care Team includes your primary Heart Failure Specialized Cardiologist (physician), Advanced Practice Providers (APPs- Physician Assistants and Nurse Practitioners), and Pharmacist who all work together to provide you with the care you need, when you need it.   You may see any of the following providers on your designated Care Team at your next follow up:  Dr. Toribio Fuel Dr. Ezra Shuck Dr. Odis Brownie Greig Mosses, NP Caffie Shed, [place] [facility] [place], [place] Beckey Coe, NP Jordan Lee, NP Tinnie Redman, PharmD   Please be sure to bring in all your medications bottles to every appointment.   Need to Contact Us :  If you have any questions or concerns before your next appointment please send us  a message through [place] or call our office at [phone].    TO LEAVE A MESSAGE FOR THE NURSE SELECT OPTION 2, PLEASE LEAVE A MESSAGE INCLUDING: YOUR NAME DATE OF BIRTH CALL BACK NUMBER REASON FOR CALL**this is important as we prioritize the call backs  YOU WILL RECEIVE A CALL BACK THE SAME DAY AS LONG AS YOU CALL BEFORE 4:00 PM

## 2024-01-28 IMAGING — CT CT ANGIO CHEST
1 of 2 series · 1 of 9 positions shown · non-contrast
Comparison: 12/16/2016 coronary calcium CT.

CLINICAL DATA: Aortic stenosis. Preoperative evaluation for TAVR.
Progress trial patient.

EXAM:
CT ANGIOGRAPHY CHEST, ABDOMEN AND PELVIS
TECHNIQUE: Multidetector CT imaging through the chest, abdomen and pelvis was
performed using the standard protocol during bolus administration of
intravenous contrast. Multiplanar reconstructed images and MIPs were
obtained and reviewed to evaluate the vascular anatomy.

[Series 2040: — · 0.13mm/px · 1 of 5 slices shown]
[im 3/5]
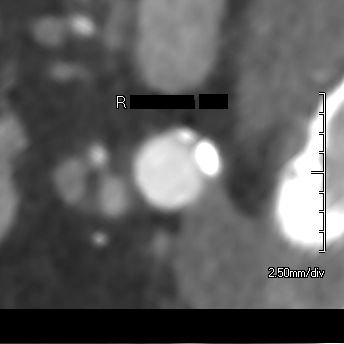

[1 of 9 positions shown; findings below may reference images not displayed]

RADIATION DOSE REDUCTION: This exam was performed according to the
departmental dose-optimization program which includes automated
exposure control, adjustment of the mA and/or kV according to
patient size and/or use of iterative reconstruction technique.

CONTRAST:  100mL OMNIPAQUE IOHEXOL 350 MG/ML SOLN
FINDINGS: CTA CHEST FINDINGS

Cardiovascular: Mild cardiomegaly. Diffuse thickening and coarse
calcifications in the aortic valve. No significant pericardial
effusion/thickening. Left anterior descending coronary
atherosclerosis. Atherosclerotic nonaneurysmal thoracic aorta.
Normal caliber pulmonary arteries. No central pulmonary emboli.

Mediastinum/Nodes: Scattered subcentimeter hypodense bilateral
thyroid nodules. Not clinically significant; no follow-up imaging
recommended (ref: [HOSPITAL]. [DATE]): 143-50).
Unremarkable esophagus. No pathologically enlarged axillary,
mediastinal or hilar lymph nodes.

Lungs/Pleura: No pneumothorax. No pleural effusion. No acute
consolidative airspace disease, lung masses or significant pulmonary
nodules.

Musculoskeletal: No aggressive appearing focal osseous lesions. Mild
thoracic spondylosis.

CTA ABDOMEN AND PELVIS FINDINGS

Hepatobiliary: Normal liver size. Hypodense subcentimeter posterior
right liver lesion is too small to characterize. No additional liver
lesions. Normal gallbladder with no radiopaque cholelithiasis. No
biliary ductal dilatation.

Pancreas: Normal, with no mass or duct dilation.

Spleen: Normal size. No mass.

Adrenals/Urinary Tract: Normal adrenals. No contour deforming renal
masses. Symmetric normal contrast nephrograms. No hydronephrosis.
Normal bladder.

Stomach/Bowel: Small hiatal hernia. Otherwise normal nondistended
stomach. Normal caliber small bowel with no small bowel wall
thickening. Normal appendix. Normal large bowel with no
diverticulosis, large bowel wall thickening or pericolonic fat
stranding.

Vascular/Lymphatic: Atherosclerotic nonaneurysmal abdominal aorta.
No pathologically enlarged lymph nodes in the abdomen or pelvis.

Reproductive: Grossly normal uterus.  No adnexal mass.

Other: No pneumoperitoneum, ascites or focal fluid collection.

Musculoskeletal: No aggressive appearing focal osseous lesions. Mild
lumbar spondylosis.

VASCULAR MEASUREMENTS PERTINENT TO TAVR:

AORTA:

Minimal Aortic Diameter-G1.0 x 11.4 mm

Severity of Aortic Calcification-mild

RIGHT PELVIS:

Right Common Iliac Artery -

Minimal Siameter-3.3 x 7.5 mm

Tortuosity-mild

Calcification-moderate

Right External Iliac Artery -

Minimal 1iameter-C.C x 6.7 mm

Tortuosity-mild-to-moderate

Calcification-none

Right Common Femoral Artery -

Minimal 9iameter-K.F x 6.0 mm

Tortuosity-mild

Calcification-mild

LEFT PELVIS:

Left Common Iliac Artery -

Minimal Oiameter-U.8 x 7.2 mm

Tortuosity-mild

Calcification-marked

Left External Iliac Artery -

Minimal Diameter-B.6 x 6.2 mm

Tortuosity-mild-to-moderate

Calcification-none

Left Common Femoral Artery -

Minimal 2iameter-2.A x 6.2 mm

Tortuosity-mild

Calcification-mild-to-moderate

Review of the MIP images confirms the above findings.
IMPRESSION: 1. Vascular findings and measurements pertinent to potential TAVR
procedure, as detailed.
2. Diffuse thickening and coarse calcification of the aortic valve,
compatible with the reported history of aortic stenosis.
3. Mild cardiomegaly. One vessel coronary atherosclerosis.
4. Small hiatal hernia.

## 2024-02-07 ENCOUNTER — Other Ambulatory Visit: Payer: Self-pay | Admitting: Internal Medicine

## 2024-05-05 ENCOUNTER — Other Ambulatory Visit (HOSPITAL_COMMUNITY)

## 2024-05-05 ENCOUNTER — Encounter
